# Patient Record
Sex: Female | Born: 1985 | Race: Black or African American | Hispanic: No | Marital: Single | Smoking: Current every day smoker
Health system: Southern US, Community
[De-identification: ages and names within clinical notes are randomized; demographics above are authoritative.]

## PROBLEM LIST (undated history)

## (undated) ENCOUNTER — Inpatient Hospital Stay (HOSPITAL_COMMUNITY): Payer: Self-pay

## (undated) ENCOUNTER — Ambulatory Visit (HOSPITAL_COMMUNITY): Payer: Self-pay

## (undated) DIAGNOSIS — F329 Major depressive disorder, single episode, unspecified: Secondary | ICD-10-CM

## (undated) DIAGNOSIS — F32A Depression, unspecified: Secondary | ICD-10-CM

## (undated) DIAGNOSIS — F909 Attention-deficit hyperactivity disorder, unspecified type: Secondary | ICD-10-CM

## (undated) DIAGNOSIS — F319 Bipolar disorder, unspecified: Secondary | ICD-10-CM

## (undated) DIAGNOSIS — Z8619 Personal history of other infectious and parasitic diseases: Secondary | ICD-10-CM

## (undated) DIAGNOSIS — I10 Essential (primary) hypertension: Secondary | ICD-10-CM

## (undated) HISTORY — PX: CHOLECYSTECTOMY: SHX55

---
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
## 2006-12-17 ENCOUNTER — Other Ambulatory Visit: Admission: RE | Admit: 2006-12-17 | Discharge: 2006-12-17 | Payer: Self-pay | Admitting: Obstetrics and Gynecology

## 2007-04-30 ENCOUNTER — Emergency Department (HOSPITAL_COMMUNITY): Admission: EM | Admit: 2007-04-30 | Discharge: 2007-04-30 | Payer: Self-pay | Admitting: Emergency Medicine

## 2007-05-28 ENCOUNTER — Inpatient Hospital Stay (HOSPITAL_COMMUNITY): Admission: AD | Admit: 2007-05-28 | Discharge: 2007-05-28 | Payer: Self-pay | Admitting: Obstetrics & Gynecology

## 2007-12-04 ENCOUNTER — Emergency Department (HOSPITAL_COMMUNITY): Admission: EM | Admit: 2007-12-04 | Discharge: 2007-12-04 | Payer: Self-pay | Admitting: Emergency Medicine

## 2007-12-08 ENCOUNTER — Emergency Department (HOSPITAL_COMMUNITY): Admission: EM | Admit: 2007-12-08 | Discharge: 2007-12-08 | Payer: Self-pay | Admitting: Emergency Medicine

## 2008-02-02 ENCOUNTER — Emergency Department (HOSPITAL_COMMUNITY): Admission: EM | Admit: 2008-02-02 | Discharge: 2008-02-02 | Payer: Self-pay | Admitting: Emergency Medicine

## 2008-02-07 ENCOUNTER — Inpatient Hospital Stay (HOSPITAL_COMMUNITY): Admission: AD | Admit: 2008-02-07 | Discharge: 2008-02-07 | Payer: Self-pay | Admitting: Family Medicine

## 2008-02-08 ENCOUNTER — Emergency Department (HOSPITAL_COMMUNITY): Admission: EM | Admit: 2008-02-08 | Discharge: 2008-02-08 | Payer: Self-pay | Admitting: Emergency Medicine

## 2008-02-14 ENCOUNTER — Inpatient Hospital Stay (HOSPITAL_COMMUNITY): Admission: RE | Admit: 2008-02-14 | Discharge: 2008-02-14 | Payer: Self-pay | Admitting: Family Medicine

## 2008-02-14 ENCOUNTER — Ambulatory Visit: Payer: Self-pay | Admitting: Physician Assistant

## 2008-10-19 ENCOUNTER — Other Ambulatory Visit: Admission: RE | Admit: 2008-10-19 | Discharge: 2008-10-19 | Payer: Self-pay | Admitting: Obstetrics and Gynecology

## 2008-11-11 ENCOUNTER — Inpatient Hospital Stay (HOSPITAL_COMMUNITY): Admission: AD | Admit: 2008-11-11 | Discharge: 2008-11-11 | Payer: Self-pay | Admitting: Obstetrics & Gynecology

## 2009-01-08 ENCOUNTER — Ambulatory Visit (HOSPITAL_COMMUNITY): Admission: RE | Admit: 2009-01-08 | Discharge: 2009-01-08 | Payer: Self-pay | Admitting: Obstetrics

## 2009-03-12 ENCOUNTER — Ambulatory Visit (HOSPITAL_COMMUNITY): Admission: RE | Admit: 2009-03-12 | Discharge: 2009-03-12 | Payer: Self-pay | Admitting: Obstetrics

## 2009-05-23 ENCOUNTER — Inpatient Hospital Stay (HOSPITAL_COMMUNITY): Admission: AD | Admit: 2009-05-23 | Discharge: 2009-05-23 | Payer: Self-pay | Admitting: Obstetrics

## 2009-06-03 ENCOUNTER — Inpatient Hospital Stay (HOSPITAL_COMMUNITY): Admission: AD | Admit: 2009-06-03 | Discharge: 2009-06-07 | Payer: Self-pay | Admitting: Obstetrics

## 2009-06-27 ENCOUNTER — Emergency Department (HOSPITAL_BASED_OUTPATIENT_CLINIC_OR_DEPARTMENT_OTHER): Admission: EM | Admit: 2009-06-27 | Discharge: 2009-06-28 | Payer: Self-pay | Admitting: Emergency Medicine

## 2009-06-28 ENCOUNTER — Ambulatory Visit: Payer: Self-pay | Admitting: Diagnostic Radiology

## 2009-07-01 ENCOUNTER — Ambulatory Visit: Admission: RE | Admit: 2009-07-01 | Discharge: 2009-07-01 | Payer: Self-pay | Admitting: Obstetrics

## 2009-07-08 ENCOUNTER — Ambulatory Visit: Admission: RE | Admit: 2009-07-08 | Discharge: 2009-07-08 | Payer: Self-pay | Admitting: Obstetrics

## 2009-07-16 ENCOUNTER — Emergency Department (HOSPITAL_BASED_OUTPATIENT_CLINIC_OR_DEPARTMENT_OTHER): Admission: EM | Admit: 2009-07-16 | Discharge: 2009-07-17 | Payer: Self-pay | Admitting: Emergency Medicine

## 2009-07-17 ENCOUNTER — Ambulatory Visit: Payer: Self-pay | Admitting: Diagnostic Radiology

## 2009-07-26 ENCOUNTER — Emergency Department (HOSPITAL_BASED_OUTPATIENT_CLINIC_OR_DEPARTMENT_OTHER): Admission: EM | Admit: 2009-07-26 | Discharge: 2009-07-27 | Payer: Self-pay | Admitting: Emergency Medicine

## 2009-08-23 ENCOUNTER — Encounter: Payer: Self-pay | Admitting: Internal Medicine

## 2009-09-09 ENCOUNTER — Ambulatory Visit (HOSPITAL_COMMUNITY): Admission: RE | Admit: 2009-09-09 | Discharge: 2009-09-09 | Payer: Self-pay | Admitting: Surgery

## 2009-10-22 ENCOUNTER — Emergency Department (HOSPITAL_COMMUNITY): Admission: EM | Admit: 2009-10-22 | Discharge: 2009-10-22 | Payer: Self-pay | Admitting: Emergency Medicine

## 2010-02-16 ENCOUNTER — Emergency Department (HOSPITAL_COMMUNITY)
Admission: EM | Admit: 2010-02-16 | Discharge: 2010-02-16 | Payer: Self-pay | Source: Home / Self Care | Admitting: Emergency Medicine

## 2010-02-21 LAB — CBC
HCT: 37.6 % (ref 36.0–46.0)
Hemoglobin: 11.9 g/dL — ABNORMAL LOW (ref 12.0–15.0)
MCH: 25.1 pg — ABNORMAL LOW (ref 26.0–34.0)
MCHC: 31.6 g/dL (ref 30.0–36.0)
MCV: 79.2 fL (ref 78.0–100.0)
Platelets: 332 10*3/uL (ref 150–400)
RDW: 14.4 % (ref 11.5–15.5)

## 2010-02-21 LAB — WET PREP, GENITAL
Trich, Wet Prep: NONE SEEN
Yeast Wet Prep HPF POC: NONE SEEN

## 2010-02-21 LAB — URINALYSIS, ROUTINE W REFLEX MICROSCOPIC
Bilirubin Urine: NEGATIVE
Ketones, ur: NEGATIVE mg/dL
Nitrite: NEGATIVE
Protein, ur: NEGATIVE mg/dL
Specific Gravity, Urine: 1.026 (ref 1.005–1.030)
Urine Glucose, Fasting: NEGATIVE mg/dL

## 2010-02-21 LAB — DIFFERENTIAL
Basophils Absolute: 0 10*3/uL (ref 0.0–0.1)
Eosinophils Relative: 1 % (ref 0–5)
Lymphocytes Relative: 27 % (ref 12–46)
Monocytes Absolute: 1.1 10*3/uL — ABNORMAL HIGH (ref 0.1–1.0)
Monocytes Relative: 7 % (ref 3–12)

## 2010-03-01 NOTE — Letter (Signed)
Summary: [facility]   Imported By: Maryln Gottron 09/20/2009 13:06:52  _____________________________________________________________________  External Attachment:    Type:   Image     Comment:   External Document

## 2010-03-24 ENCOUNTER — Ambulatory Visit (INDEPENDENT_AMBULATORY_CARE_PROVIDER_SITE_OTHER): Payer: Self-pay

## 2010-03-24 ENCOUNTER — Inpatient Hospital Stay (INDEPENDENT_AMBULATORY_CARE_PROVIDER_SITE_OTHER)
Admission: RE | Admit: 2010-03-24 | Discharge: 2010-03-24 | Disposition: A | Discharge status: Home / Self Care | Payer: Self-pay | Source: Ambulatory Visit | Attending: Family Medicine | Admitting: Family Medicine

## 2010-03-24 DIAGNOSIS — K5900 Constipation, unspecified: Secondary | ICD-10-CM

## 2010-03-24 LAB — POCT URINALYSIS DIPSTICK
Nitrite: NEGATIVE
Protein, ur: 30 mg/dL — AB
Specific Gravity, Urine: 1.025 (ref 1.005–1.030)
Urine Glucose, Fasting: NEGATIVE mg/dL

## 2010-03-24 LAB — POCT PREGNANCY, URINE: Preg Test, Ur: NEGATIVE

## 2010-04-14 LAB — POCT I-STAT, CHEM 8
BUN: 17 mg/dL (ref 6–23)
Calcium, Ion: 1.19 mmol/L (ref 1.12–1.32)
HCT: 39 % (ref 36.0–46.0)
Hemoglobin: 13.3 g/dL (ref 12.0–15.0)
Sodium: 140 mEq/L (ref 135–145)
TCO2: 26 mmol/L (ref 0–100)

## 2010-04-14 LAB — URINALYSIS, ROUTINE W REFLEX MICROSCOPIC
Hgb urine dipstick: NEGATIVE
Ketones, ur: NEGATIVE mg/dL
Nitrite: NEGATIVE
Protein, ur: NEGATIVE mg/dL
Urobilinogen, UA: 0.2 mg/dL (ref 0.0–1.0)

## 2010-04-14 LAB — POCT PREGNANCY, URINE: Preg Test, Ur: NEGATIVE

## 2010-04-15 LAB — CBC
HCT: 34.5 % — ABNORMAL LOW (ref 36.0–46.0)
Hemoglobin: 11.3 g/dL — ABNORMAL LOW (ref 12.0–15.0)
MCHC: 32.8 g/dL (ref 30.0–36.0)
RBC: 4.49 MIL/uL (ref 3.87–5.11)
WBC: 10.1 10*3/uL (ref 4.0–10.5)

## 2010-04-15 LAB — COMPREHENSIVE METABOLIC PANEL
ALT: 16 U/L (ref 0–35)
AST: 17 U/L (ref 0–37)
Alkaline Phosphatase: 93 U/L (ref 39–117)
CO2: 25 mEq/L (ref 19–32)
Calcium: 9.4 mg/dL (ref 8.4–10.5)
Chloride: 108 mEq/L (ref 96–112)
GFR calc Af Amer: >60 mL/min (ref >60)
GFR calc non Af Amer: >60 mL/min (ref >60)
Glucose, Bld: 88 mg/dL (ref 70–99)
Potassium: 3.6 mEq/L (ref 3.5–5.1)
Sodium: 139 mEq/L (ref 135–145)

## 2010-04-15 LAB — SURGICAL PCR SCREEN: Staphylococcus aureus: NEGATIVE

## 2010-04-17 LAB — COMPREHENSIVE METABOLIC PANEL
ALT: 39 U/L — ABNORMAL HIGH (ref 0–35)
AST: 107 U/L — ABNORMAL HIGH (ref 0–37)
Albumin: 3.9 g/dL (ref 3.5–5.2)
Alkaline Phosphatase: 100 U/L (ref 39–117)
BUN: 15 mg/dL (ref 6–23)
Calcium: 9.5 mg/dL (ref 8.4–10.5)
GFR calc Af Amer: >60 mL/min (ref >60)
Glucose, Bld: 88 mg/dL (ref 70–99)
Potassium: 3.9 mEq/L (ref 3.5–5.1)
Potassium: 4 mEq/L (ref 3.5–5.1)
Sodium: 147 mEq/L — ABNORMAL HIGH (ref 135–145)
Total Protein: 7.2 g/dL (ref 6.0–8.3)
Total Protein: 7.6 g/dL (ref 6.0–8.3)

## 2010-04-17 LAB — DIFFERENTIAL
Basophils Absolute: 0.1 10*3/uL (ref 0.0–0.1)
Basophils Relative: 0 % (ref 0–1)
Eosinophils Absolute: 0.1 10*3/uL (ref 0.0–0.7)
Eosinophils Relative: 1 % (ref 0–5)
Lymphs Abs: 3 10*3/uL (ref 0.7–4.0)
Monocytes Absolute: 1 10*3/uL (ref 0.1–1.0)
Monocytes Relative: 6 % (ref 3–12)
Monocytes Relative: 7 % (ref 3–12)
Neutro Abs: 7.1 10*3/uL (ref 1.7–7.7)
Neutrophils Relative %: 55 % (ref 43–77)

## 2010-04-17 LAB — CBC
HCT: 33.4 % — ABNORMAL LOW (ref 36.0–46.0)
MCH: 25.9 pg — ABNORMAL LOW (ref 26.0–34.0)
MCHC: 32.3 g/dL (ref 30.0–36.0)
MCHC: 33 g/dL (ref 30.0–36.0)
MCV: 78.5 fL (ref 78.0–100.0)
RBC: 4.25 MIL/uL (ref 3.87–5.11)
RDW: 13.9 % (ref 11.5–15.5)
RDW: 14.7 % (ref 11.5–15.5)
WBC: 13 10*3/uL — ABNORMAL HIGH (ref 4.0–10.5)

## 2010-04-17 LAB — URINE MICROSCOPIC-ADD ON

## 2010-04-17 LAB — URINALYSIS, ROUTINE W REFLEX MICROSCOPIC
Glucose, UA: NEGATIVE mg/dL
Hgb urine dipstick: NEGATIVE
Ketones, ur: NEGATIVE mg/dL
pH: 7 (ref 5.0–8.0)

## 2010-04-18 LAB — CBC
MCV: 81.8 fL (ref 78.0–100.0)
RBC: 4.22 MIL/uL (ref 3.87–5.11)
WBC: 11.3 10*3/uL — ABNORMAL HIGH (ref 4.0–10.5)

## 2010-04-18 LAB — LIPASE, BLOOD: Lipase: 193 U/L (ref 23–300)

## 2010-04-18 LAB — COMPREHENSIVE METABOLIC PANEL
ALT: 155 U/L — ABNORMAL HIGH (ref 0–35)
AST: 296 U/L — ABNORMAL HIGH (ref 0–37)
Alkaline Phosphatase: 266 U/L — ABNORMAL HIGH (ref 39–117)
CO2: 26 mEq/L (ref 19–32)
Chloride: 104 mEq/L (ref 96–112)
GFR calc Af Amer: >60 mL/min (ref >60)
GFR calc non Af Amer: >60 mL/min (ref >60)
Potassium: 4.3 mEq/L (ref 3.5–5.1)
Sodium: 144 mEq/L (ref 135–145)
Total Bilirubin: 0.6 mg/dL (ref 0.3–1.2)

## 2010-04-18 LAB — DIFFERENTIAL
Basophils Absolute: 0 10*3/uL (ref 0.0–0.1)
Eosinophils Absolute: 0.1 10*3/uL (ref 0.0–0.7)
Eosinophils Relative: 1 % (ref 0–5)

## 2010-04-19 LAB — CBC
HCT: 31.2 % — ABNORMAL LOW (ref 36.0–46.0)
MCV: 84.3 fL (ref 78.0–100.0)
Platelets: 276 10*3/uL (ref 150–400)
Platelets: 338 10*3/uL (ref 150–400)
RDW: 13.4 % (ref 11.5–15.5)
RDW: 13.6 % (ref 11.5–15.5)
WBC: 31.4 10*3/uL — ABNORMAL HIGH (ref 4.0–10.5)

## 2010-04-19 LAB — CCBB MATERNAL DONOR DRAW

## 2010-04-19 LAB — COMPREHENSIVE METABOLIC PANEL
AST: 26 U/L (ref 0–37)
Albumin: 1.9 g/dL — ABNORMAL LOW (ref 3.5–5.2)
Chloride: 106 mEq/L (ref 96–112)
Creatinine, Ser: 1.15 mg/dL (ref 0.4–1.2)
GFR calc Af Amer: >60 mL/min (ref >60)
Total Bilirubin: 1.3 mg/dL — ABNORMAL HIGH (ref 0.3–1.2)
Total Protein: 5 g/dL — ABNORMAL LOW (ref 6.0–8.3)

## 2010-05-05 LAB — URINALYSIS, ROUTINE W REFLEX MICROSCOPIC
Bilirubin Urine: NEGATIVE
Glucose, UA: NEGATIVE mg/dL
Hgb urine dipstick: NEGATIVE
Ketones, ur: NEGATIVE mg/dL
Protein, ur: NEGATIVE mg/dL

## 2010-05-05 LAB — GC/CHLAMYDIA PROBE AMP, GENITAL
Chlamydia, DNA Probe: NEGATIVE
GC Probe Amp, Genital: NEGATIVE

## 2010-05-05 LAB — URINE MICROSCOPIC-ADD ON

## 2010-05-05 LAB — WET PREP, GENITAL
Trich, Wet Prep: NONE SEEN
Yeast Wet Prep HPF POC: NONE SEEN

## 2010-05-16 LAB — CBC
HCT: 39.5 % (ref 36.0–46.0)
Hemoglobin: 12.9 g/dL (ref 12.0–15.0)
MCV: 85.9 fL (ref 78.0–100.0)
Platelets: 299 10*3/uL (ref 150–400)
RDW: 12.9 % (ref 11.5–15.5)

## 2010-05-16 LAB — HCG, QUANTITATIVE, PREGNANCY: hCG, Beta Chain, Quant, S: 5545 m[IU]/mL — ABNORMAL HIGH (ref <5)

## 2010-05-16 LAB — HEMOGLOBIN AND HEMATOCRIT, BLOOD: Hemoglobin: 13.6 g/dL (ref 12.0–15.0)

## 2010-05-16 LAB — WET PREP, GENITAL: Trich, Wet Prep: NONE SEEN

## 2010-05-31 ENCOUNTER — Emergency Department (HOSPITAL_COMMUNITY)
Admission: EM | Admit: 2010-05-31 | Discharge: 2010-05-31 | Disposition: A | Discharge status: Home / Self Care | Payer: Self-pay | Attending: Emergency Medicine | Admitting: Emergency Medicine

## 2010-05-31 DIAGNOSIS — N898 Other specified noninflammatory disorders of vagina: Secondary | ICD-10-CM | POA: Insufficient documentation

## 2010-05-31 DIAGNOSIS — N949 Unspecified condition associated with female genital organs and menstrual cycle: Secondary | ICD-10-CM | POA: Insufficient documentation

## 2010-05-31 DIAGNOSIS — R109 Unspecified abdominal pain: Secondary | ICD-10-CM | POA: Insufficient documentation

## 2010-05-31 LAB — URINE MICROSCOPIC-ADD ON

## 2010-05-31 LAB — POCT I-STAT, CHEM 8
Calcium, Ion: 1.18 mmol/L (ref 1.12–1.32)
Chloride: 104 mEq/L (ref 96–112)
Creatinine, Ser: 1.1 mg/dL (ref 0.4–1.2)
Glucose, Bld: 90 mg/dL (ref 70–99)
HCT: 43 % (ref 36.0–46.0)
Potassium: 4 mEq/L (ref 3.5–5.1)

## 2010-05-31 LAB — HEPATIC FUNCTION PANEL
AST: 13 U/L (ref 0–37)
Bilirubin, Direct: <0.1 mg/dL (ref 0.0–0.3)
Total Bilirubin: 0.2 mg/dL — ABNORMAL LOW (ref 0.3–1.2)

## 2010-05-31 LAB — DIFFERENTIAL
Eosinophils Absolute: 0.1 10*3/uL (ref 0.0–0.7)
Eosinophils Relative: 1 % (ref 0–5)
Lymphocytes Relative: 33 % (ref 12–46)
Lymphs Abs: 4.2 10*3/uL — ABNORMAL HIGH (ref 0.7–4.0)
Monocytes Absolute: 0.8 10*3/uL (ref 0.1–1.0)
Monocytes Relative: 6 % (ref 3–12)

## 2010-05-31 LAB — CBC
HCT: 37.1 % (ref 36.0–46.0)
MCH: 26.4 pg (ref 26.0–34.0)
MCHC: 32.1 g/dL (ref 30.0–36.0)
MCV: 82.3 fL (ref 78.0–100.0)
Platelets: 303 10*3/uL (ref 150–400)
RDW: 15.3 % (ref 11.5–15.5)
WBC: 13 10*3/uL — ABNORMAL HIGH (ref 4.0–10.5)

## 2010-05-31 LAB — URINALYSIS, ROUTINE W REFLEX MICROSCOPIC
Glucose, UA: NEGATIVE mg/dL
Ketones, ur: NEGATIVE mg/dL
Leukocytes, UA: NEGATIVE
Nitrite: NEGATIVE
Protein, ur: 30 mg/dL — AB
pH: 6 (ref 5.0–8.0)

## 2010-05-31 LAB — WET PREP, GENITAL

## 2010-05-31 LAB — PREGNANCY, URINE: Preg Test, Ur: NEGATIVE

## 2010-06-01 LAB — GC/CHLAMYDIA PROBE AMP, GENITAL: Chlamydia, DNA Probe: NEGATIVE

## 2010-10-24 LAB — RAPID STREP SCREEN (MED CTR MEBANE ONLY): Streptococcus, Group A Screen (Direct): NEGATIVE

## 2010-10-25 LAB — WET PREP, GENITAL: Yeast Wet Prep HPF POC: NONE SEEN

## 2010-10-25 LAB — URINALYSIS, ROUTINE W REFLEX MICROSCOPIC
Bilirubin Urine: NEGATIVE
Glucose, UA: NEGATIVE
Hgb urine dipstick: NEGATIVE
Specific Gravity, Urine: >1.03 — ABNORMAL HIGH
pH: 6

## 2010-10-25 LAB — POCT PREGNANCY, URINE: Operator id: 25114

## 2011-08-11 ENCOUNTER — Encounter (HOSPITAL_COMMUNITY): Payer: Self-pay | Admitting: *Deleted

## 2011-08-11 ENCOUNTER — Inpatient Hospital Stay (HOSPITAL_COMMUNITY)
Admission: AD | Admit: 2011-08-11 | Discharge: 2011-08-11 | Disposition: A | Discharge status: Home / Self Care | Payer: Medicaid Other | Source: Ambulatory Visit | Attending: Obstetrics | Admitting: Obstetrics

## 2011-08-11 DIAGNOSIS — B9689 Other specified bacterial agents as the cause of diseases classified elsewhere: Secondary | ICD-10-CM | POA: Insufficient documentation

## 2011-08-11 DIAGNOSIS — N938 Other specified abnormal uterine and vaginal bleeding: Secondary | ICD-10-CM | POA: Insufficient documentation

## 2011-08-11 DIAGNOSIS — N939 Abnormal uterine and vaginal bleeding, unspecified: Secondary | ICD-10-CM

## 2011-08-11 DIAGNOSIS — N760 Acute vaginitis: Secondary | ICD-10-CM | POA: Insufficient documentation

## 2011-08-11 DIAGNOSIS — N949 Unspecified condition associated with female genital organs and menstrual cycle: Secondary | ICD-10-CM | POA: Insufficient documentation

## 2011-08-11 DIAGNOSIS — N921 Excessive and frequent menstruation with irregular cycle: Secondary | ICD-10-CM

## 2011-08-11 DIAGNOSIS — N898 Other specified noninflammatory disorders of vagina: Secondary | ICD-10-CM

## 2011-08-11 DIAGNOSIS — A499 Bacterial infection, unspecified: Secondary | ICD-10-CM | POA: Insufficient documentation

## 2011-08-11 LAB — URINE MICROSCOPIC-ADD ON

## 2011-08-11 LAB — URINALYSIS, ROUTINE W REFLEX MICROSCOPIC
Glucose, UA: NEGATIVE mg/dL
Leukocytes, UA: NEGATIVE
Nitrite: NEGATIVE
Protein, ur: NEGATIVE mg/dL
Urobilinogen, UA: 0.2 mg/dL (ref 0.0–1.0)

## 2011-08-11 LAB — CBC WITH DIFFERENTIAL/PLATELET
Basophils Absolute: 0 10*3/uL (ref 0.0–0.1)
Eosinophils Relative: 1 % (ref 0–5)
HCT: 41.9 % (ref 36.0–46.0)
Lymphocytes Relative: 40 % (ref 12–46)
MCHC: 31.5 g/dL (ref 30.0–36.0)
MCV: 84.1 fL (ref 78.0–100.0)
Monocytes Absolute: 0.8 10*3/uL (ref 0.1–1.0)
RDW: 14.4 % (ref 11.5–15.5)
WBC: 12.9 10*3/uL — ABNORMAL HIGH (ref 4.0–10.5)

## 2011-08-11 LAB — WET PREP, GENITAL: Trich, Wet Prep: NONE SEEN

## 2011-08-11 MED ORDER — METRONIDAZOLE 500 MG PO TABS: 500.0000 mg | ORAL_TABLET | Freq: Two times a day (BID) | ORAL | Status: AC

## 2011-08-11 NOTE — MAU Provider Note (Signed)
History     CSN: [id number]  Arrival date and time: 08/11/11 1425   First Provider Initiated Contact with Patient 08/11/11 1519      Chief Complaint  Patient presents with  . Vaginal Bleeding   HPI Ms Fujii is a 26 yo female who presents today with reports of small amounts of vaginal bleeding over the past 1 - 2 months. She notices the bleeding when wiping after urinating, does not require pad or tampon. Concomitant lower abdominal cramping. No sexual activity since bleeding began. Pt questioned whether could be due to the Implanon implant in her left arm, place 08/17/09. She had difficulty remembering LMP, but dated it to be at the beginning of June. She bled for 5 days, normal flow, then 1 day of no bleeding before returning to the ongoing scant vaginal bleeding.   Pertinent Gynecological History: Menses: flow is moderate, usually lasting less than 6 days and with minimal cramping Bleeding: intermenstrual bleeding Contraception: Implanon Sexually transmitted diseases: no past history and currently at risk Last pap: pt does not know date.  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 History is unclear.  History reviewed. No pertinent past medical history.  Past Surgical History  Procedure Date  . Cholecystectomy after baby was born    History reviewed. No pertinent family history.  History  Substance Use Topics  . Smoking status: Former Games developer  . Smokeless tobacco: Not on file  . Alcohol Use: No    Allergies: No Known Allergies  No prescriptions prior to admission    Review of Systems  Constitutional: Positive for fever (per pt report, did not take temperature at the time). Negative for chills.  Gastrointestinal: Positive for nausea, vomiting (1x last week) and abdominal pain (cramping).  Genitourinary: Negative for dysuria, urgency, frequency, hematuria and flank pain.   Physical Exam   Blood pressure 129/76, pulse 69, temperature 97.8 F (36.6 C), temperature source Oral,  resp. rate 18.  Physical Exam  Constitutional: She appears well-developed and well-nourished. No distress.  HENT:  Head: Normocephalic and atraumatic.  Cardiovascular: Normal rate, regular rhythm and normal heart sounds.   Respiratory: Effort normal and breath sounds normal.  GI: Soft. Bowel sounds are normal. She exhibits no mass. There is tenderness in the suprapubic area. There is no rebound and no guarding.  Genitourinary: Pelvic exam was performed with patient supine. Uterus is not tender (mild). Cervix exhibits no friability. Right adnexum displays no mass and no tenderness. Left adnexum displays no mass and no tenderness. There is bleeding (small amount of dark red blood in vaginal vault, no active bleeding; malodorous) around the vagina. No erythema or tenderness around the vagina. Vaginal discharge: malodorous.    MAU Course  Procedures  MDM Initial differential: Abnormal vaginal bleeding.  Urine pregnancy, urinalysis ordered CBC w/diff, GC/Chlamydia, wet prep ordered Results for orders placed during the hospital encounter of 08/11/11 (from the past 24 hour(s))  URINALYSIS, ROUTINE W REFLEX MICROSCOPIC     Status: Abnormal   Collection Time   08/11/11  2:47 PM      Component Value Range   Color, Urine YELLOW  YELLOW   APPearance CLEAR  CLEAR   Specific Gravity, Urine >1.030 (*) 1.005 - 1.030   pH 6.0  5.0 - 8.0   Glucose, UA NEGATIVE  NEGATIVE mg/dL   Hgb urine dipstick TRACE (*) NEGATIVE   Bilirubin Urine NEGATIVE  NEGATIVE   Ketones, ur NEGATIVE  NEGATIVE mg/dL   Protein, ur NEGATIVE  NEGATIVE mg/dL  Urobilinogen, UA 0.2  0.0 - 1.0 mg/dL   Nitrite NEGATIVE  NEGATIVE   Leukocytes, UA NEGATIVE  NEGATIVE  URINE MICROSCOPIC-ADD ON     Status: Abnormal   Collection Time   08/11/11  2:47 PM      Component Value Range   Squamous Epithelial / LPF FEW (*) RARE   RBC / HPF 0-2  <3 RBC/hpf   Bacteria, UA FEW (*) RARE   Urine-Other MUCOUS PRESENT    POCT PREGNANCY, URINE      Status: Normal   Collection Time   08/11/11  3:07 PM      Component Value Range   Preg Test, Ur NEGATIVE  NEGATIVE  CBC WITH DIFFERENTIAL     Status: Abnormal   Collection Time   08/11/11  3:39 PM      Component Value Range   WBC 12.9 (*) 4.0 - 10.5 K/uL   RBC 4.98  3.87 - 5.11 MIL/uL   Hemoglobin 13.2  12.0 - 15.0 g/dL   HCT 09.8  11.9 - 14.7 %   MCV 84.1  78.0 - 100.0 fL   MCH 26.5  26.0 - 34.0 pg   MCHC 31.5  30.0 - 36.0 g/dL   RDW 82.9  56.2 - 13.0 %   Platelets 290  150 - 400 K/uL   Neutrophils Relative 52  43 - 77 %   Neutro Abs 6.7  1.7 - 7.7 K/uL   Lymphocytes Relative 40  12 - 46 %   Lymphs Abs 5.2 (*) 0.7 - 4.0 K/uL   Monocytes Relative 6  3 - 12 %   Monocytes Absolute 0.8  0.1 - 1.0 K/uL   Eosinophils Relative 1  0 - 5 %   Eosinophils Absolute 0.2  0.0 - 0.7 K/uL   Basophils Relative 0  0 - 1 %   Basophils Absolute 0.0  0.0 - 0.1 K/uL  WET PREP, GENITAL     Status: Abnormal   Collection Time   08/11/11  3:50 PM      Component Value Range   Yeast Wet Prep HPF POC NONE SEEN  NONE SEEN   Trich, Wet Prep NONE SEEN  NONE SEEN   Clue Cells Wet Prep HPF POC FEW (*) NONE SEEN   WBC, Wet Prep HPF POC FEW (*) NONE SEEN    Assessment and Plan  1. Negative urine pregnancy 2. Abnormal vaginal bleeding with Implanon 3.  Bacterial vaginosis  Plan:1.. Prescribe flagyl 500 mg bid x7days, counseled pt on BV and the medication 2. FOllow up with Dr. Gaynell Face for continue bleeding after treatment for BV 3>  Patient instructed to call Dr. Elsie Stain office for GC/CHL results on Tuesday  [place] 08/11/2011, 3:27 PM

## 2011-08-11 NOTE — MAU Note (Signed)
Pt presents with complaints of abdominal pain for approximately 2 months with bleeding in between menstrual cycle

## 2011-12-24 ENCOUNTER — Inpatient Hospital Stay (HOSPITAL_COMMUNITY)
Admission: AD | Admit: 2011-12-24 | Discharge: 2011-12-24 | Disposition: A | Discharge status: Home / Self Care | Payer: Self-pay | Source: Ambulatory Visit | Attending: Obstetrics & Gynecology | Admitting: Obstetrics & Gynecology

## 2011-12-24 ENCOUNTER — Encounter (HOSPITAL_COMMUNITY): Payer: Self-pay | Admitting: Obstetrics and Gynecology

## 2011-12-24 DIAGNOSIS — N760 Acute vaginitis: Secondary | ICD-10-CM | POA: Insufficient documentation

## 2011-12-24 DIAGNOSIS — B9689 Other specified bacterial agents as the cause of diseases classified elsewhere: Secondary | ICD-10-CM | POA: Insufficient documentation

## 2011-12-24 DIAGNOSIS — A499 Bacterial infection, unspecified: Secondary | ICD-10-CM | POA: Insufficient documentation

## 2011-12-24 DIAGNOSIS — R109 Unspecified abdominal pain: Secondary | ICD-10-CM | POA: Insufficient documentation

## 2011-12-24 LAB — URINE MICROSCOPIC-ADD ON

## 2011-12-24 LAB — WET PREP, GENITAL: Yeast Wet Prep HPF POC: NONE SEEN

## 2011-12-24 LAB — URINALYSIS, ROUTINE W REFLEX MICROSCOPIC
Bilirubin Urine: NEGATIVE
Glucose, UA: NEGATIVE mg/dL
Ketones, ur: NEGATIVE mg/dL
pH: 6 (ref 5.0–8.0)

## 2011-12-24 MED ORDER — METRONIDAZOLE 500 MG PO TABS: 500.0000 mg | ORAL_TABLET | Freq: Two times a day (BID) | ORAL | Status: DC

## 2011-12-24 NOTE — MAU Note (Signed)
Pt presents to MAU with chief complaint of lower abdominal pain. Pt is not pregnant- came today for a pregnancy test. States she started her Menses today; however this is the 2nd cycle this month. Pt is sexually actve; unprotected, recently had her Nexplanon removed due to irregular bleeding.

## 2011-12-24 NOTE — MAU Note (Signed)
Pt report having pain in her abd and pelvic area for 1 week on and off
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
## 2012-01-31 ENCOUNTER — Encounter (HOSPITAL_COMMUNITY): Payer: Self-pay | Admitting: Emergency Medicine

## 2012-01-31 ENCOUNTER — Emergency Department (HOSPITAL_COMMUNITY)
Admission: EM | Admit: 2012-01-31 | Discharge: 2012-01-31 | Disposition: A | Discharge status: Home / Self Care | Payer: Self-pay | Attending: Emergency Medicine | Admitting: Emergency Medicine

## 2012-01-31 ENCOUNTER — Emergency Department (HOSPITAL_COMMUNITY): Payer: Self-pay

## 2012-01-31 DIAGNOSIS — T148XXA Other injury of unspecified body region, initial encounter: Secondary | ICD-10-CM

## 2012-01-31 DIAGNOSIS — IMO0002 Reserved for concepts with insufficient information to code with codable children: Secondary | ICD-10-CM | POA: Insufficient documentation

## 2012-01-31 DIAGNOSIS — T7491XA Unspecified adult maltreatment, confirmed, initial encounter: Secondary | ICD-10-CM | POA: Insufficient documentation

## 2012-01-31 DIAGNOSIS — T7492XA Unspecified child maltreatment, confirmed, initial encounter: Secondary | ICD-10-CM | POA: Insufficient documentation

## 2012-01-31 DIAGNOSIS — M25511 Pain in right shoulder: Secondary | ICD-10-CM

## 2012-01-31 MED ORDER — HYDROMORPHONE HCL PF 1 MG/ML IJ SOLN
1.0000 mg | Freq: Once | INTRAMUSCULAR | Status: AC
  Administered 2012-01-31: 1 mg via INTRAVENOUS
  Filled 2012-01-31: qty 1

## 2012-01-31 MED ORDER — ONDANSETRON HCL 4 MG/2ML IJ SOLN
4.0000 mg | Freq: Once | INTRAMUSCULAR | Status: AC
  Administered 2012-01-31: 4 mg via INTRAVENOUS
  Filled 2012-01-31: qty 2

## 2012-01-31 MED ORDER — OXYCODONE-ACETAMINOPHEN 5-325 MG PO TABS: ORAL_TABLET | ORAL | Status: DC
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
## 2012-01-31 NOTE — ED Notes (Signed)
Patient transported to X-ray 
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
## 2012-01-31 NOTE — Progress Notes (Signed)
Orthopedic Tech Progress Note Patient Details:  Suzanne Bush November 24, 1985 [id number]  Ortho Devices Type of Ortho Device: Arm sling Ortho Device/Splint Location: RIGHT ARM SLING Ortho Device/Splint Interventions: Application   Cammer, Mickie Bail 01/31/2012, 5:54 AM
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
## 2012-08-13 ENCOUNTER — Emergency Department (HOSPITAL_BASED_OUTPATIENT_CLINIC_OR_DEPARTMENT_OTHER)
Admission: EM | Admit: 2012-08-13 | Discharge: 2012-08-14 | Disposition: A | Discharge status: Home / Self Care | Payer: Medicaid Other | Attending: Emergency Medicine | Admitting: Emergency Medicine

## 2012-08-13 ENCOUNTER — Encounter (HOSPITAL_BASED_OUTPATIENT_CLINIC_OR_DEPARTMENT_OTHER): Payer: Self-pay | Admitting: *Deleted

## 2012-08-13 DIAGNOSIS — A599 Trichomoniasis, unspecified: Secondary | ICD-10-CM

## 2012-08-13 DIAGNOSIS — N898 Other specified noninflammatory disorders of vagina: Secondary | ICD-10-CM | POA: Insufficient documentation

## 2012-08-13 DIAGNOSIS — L502 Urticaria due to cold and heat: Secondary | ICD-10-CM | POA: Insufficient documentation

## 2012-08-13 DIAGNOSIS — Z3202 Encounter for pregnancy test, result negative: Secondary | ICD-10-CM | POA: Insufficient documentation

## 2012-08-13 DIAGNOSIS — Z79899 Other long term (current) drug therapy: Secondary | ICD-10-CM | POA: Insufficient documentation

## 2012-08-13 DIAGNOSIS — A5901 Trichomonal vulvovaginitis: Secondary | ICD-10-CM | POA: Insufficient documentation

## 2012-08-13 DIAGNOSIS — F17200 Nicotine dependence, unspecified, uncomplicated: Secondary | ICD-10-CM | POA: Insufficient documentation

## 2012-08-13 DIAGNOSIS — Z8619 Personal history of other infectious and parasitic diseases: Secondary | ICD-10-CM | POA: Insufficient documentation

## 2012-08-13 DIAGNOSIS — L293 Anogenital pruritus, unspecified: Secondary | ICD-10-CM | POA: Insufficient documentation

## 2012-08-13 NOTE — ED Notes (Signed)
Pt c/o hives x 2 days

## 2012-08-14 LAB — URINE MICROSCOPIC-ADD ON

## 2012-08-14 LAB — URINALYSIS, ROUTINE W REFLEX MICROSCOPIC
Ketones, ur: NEGATIVE mg/dL
Nitrite: NEGATIVE
Specific Gravity, Urine: 1.034 — ABNORMAL HIGH (ref 1.005–1.030)
pH: 6 (ref 5.0–8.0)

## 2012-08-14 LAB — WET PREP, GENITAL

## 2012-08-14 LAB — PREGNANCY, URINE: Preg Test, Ur: NEGATIVE

## 2012-08-14 LAB — GC/CHLAMYDIA PROBE AMP
CT Probe RNA: NEGATIVE
GC Probe RNA: NEGATIVE

## 2012-08-14 MED ORDER — PREDNISONE 50 MG PO TABS
60.0000 mg | ORAL_TABLET | Freq: Once | ORAL | Status: AC
  Administered 2012-08-14: 60 mg via ORAL

## 2012-08-14 MED ORDER — PREDNISONE 50 MG PO TABS
ORAL_TABLET | ORAL | Status: AC
  Filled 2012-08-14: qty 1

## 2012-08-14 MED ORDER — DIPHENHYDRAMINE HCL 25 MG PO CAPS: 25.0000 mg | ORAL_CAPSULE | Freq: Four times a day (QID) | ORAL | Status: DC | PRN

## 2012-08-14 MED ORDER — LIDOCAINE HCL (PF) 1 % IJ SOLN
INTRAMUSCULAR | Status: AC
  Filled 2012-08-14: qty 5

## 2012-08-14 MED ORDER — METRONIDAZOLE 500 MG PO TABS
2000.0000 mg | ORAL_TABLET | Freq: Once | ORAL | Status: AC
  Administered 2012-08-14: 2000 mg via ORAL
  Filled 2012-08-14: qty 4

## 2012-08-14 MED ORDER — PREDNISONE 10 MG PO TABS
ORAL_TABLET | ORAL | Status: AC
  Filled 2012-08-14: qty 1

## 2012-08-14 MED ORDER — AZITHROMYCIN 250 MG PO TABS
1000.0000 mg | ORAL_TABLET | Freq: Once | ORAL | Status: AC
  Administered 2012-08-14: 1000 mg via ORAL
  Filled 2012-08-14: qty 4

## 2012-08-14 MED ORDER — FLUCONAZOLE 50 MG PO TABS
150.0000 mg | ORAL_TABLET | Freq: Once | ORAL | Status: AC
  Administered 2012-08-14: 150 mg via ORAL
  Filled 2012-08-14: qty 1

## 2012-08-14 MED ORDER — DIPHENHYDRAMINE HCL 25 MG PO CAPS
25.0000 mg | ORAL_CAPSULE | Freq: Four times a day (QID) | ORAL | Status: DC | PRN
  Administered 2012-08-14: 25 mg via ORAL
  Filled 2012-08-14: qty 1

## 2012-08-14 MED ORDER — PREDNISOLONE 5 MG PO TABS
60.0000 mg | ORAL_TABLET | Freq: Once | ORAL | Status: DC
  Filled 2012-08-14: qty 12

## 2012-08-14 MED ORDER — CEFTRIAXONE SODIUM 250 MG IJ SOLR
250.0000 mg | Freq: Once | INTRAMUSCULAR | Status: AC
  Administered 2012-08-14: 250 mg via INTRAMUSCULAR
  Filled 2012-08-14: qty 250

## 2012-08-14 MED ORDER — PREDNISONE 50 MG PO TABS: 50.0000 mg | ORAL_TABLET | Freq: Every day | ORAL | Status: DC

## 2012-08-14 NOTE — ED Provider Notes (Signed)
History    CSN: [id number] Arrival date & time 08/13/12  2314  First MD Initiated Contact with Patient 08/14/12 0010     Chief Complaint  Patient presents with  . Rash   (Consider location/radiation/quality/duration/timing/severity/associated sxs/prior Treatment) HPI Comments: Pt comes in with cc of rash and vaginal itching and discomfort. Rash started 2 days ago, with the arms, then spread diffusely. The rash is itchy. Hx of eczema, no wheezing. Pt has vaginal itching, and a clear discharge with some mild discomfort. Hx of STD, but seeing only 1 partner right now.  Patient is a 27 y.o. female presenting with rash. The history is provided by the patient.  Rash Associated symptoms: vaginal discharge   Associated symptoms: no chest pain, no dysuria, no nausea, no shortness of breath and no vomiting    History reviewed. No pertinent past medical history. Past Surgical History  Procedure Laterality Date  . Cholecystectomy  after baby was born   History reviewed. No pertinent family history. History  Substance Use Topics  . Smoking status: Current Every Day Smoker -- 1.00 packs/day    Types: Cigarettes  . Smokeless tobacco: Not on file  . Alcohol Use: Yes   OB History   Grav Para Term Preterm Abortions TAB SAB Ect Mult Living   3 1 1  0 2 0 2 0 0 1     Review of Systems  Constitutional: Negative for activity change.  HENT: Negative for neck pain.   Respiratory: Negative for shortness of breath.   Cardiovascular: Negative for chest pain.  Gastrointestinal: Negative for nausea, vomiting and abdominal pain.  Genitourinary: Positive for vaginal discharge. Negative for dysuria and flank pain.  Skin: Positive for rash.  Neurological: Negative for headaches.    Allergies  Review of patient's allergies indicates no known allergies.  Home Medications   Current Outpatient Rx  Name  Route  Sig  Dispense  Refill  . diphenhydrAMINE (BENADRYL) 25 mg capsule   Oral   Take 1  capsule (25 mg total) by mouth every 6 (six) hours as needed for itching.   30 capsule   0   . oxyCODONE-acetaminophen (PERCOCET/ROXICET) 5-325 MG per tablet      1 to 2 tabs PO q6hrs  PRN for pain   15 tablet   0   . predniSONE (DELTASONE) 50 MG tablet   Oral   Take 1 tablet (50 mg total) by mouth daily.   5 tablet   0    BP 136/86  Pulse 72  Temp(Src) 97.7 F (36.5 C) (Oral)  Resp 16  Ht 5\' 5"  (1.651 m)  Wt 219 lb (99.338 kg)  BMI 36.44 kg/m2  SpO2 100%  LMP 07/12/2012 Physical Exam  Constitutional: She is oriented to person, place, and time. She appears well-developed and well-nourished.  HENT:  Head: Normocephalic and atraumatic.  Eyes: Conjunctivae and EOM are normal. Pupils are equal, round, and reactive to light.  Neck: Normal range of motion. Neck supple.  Cardiovascular: Normal rate, regular rhythm, normal heart sounds and intact distal pulses.   No murmur heard. Pulmonary/Chest: Effort normal. No respiratory distress. She has no wheezes.  Abdominal: Soft. Bowel sounds are normal. She exhibits no distension. There is no tenderness. There is no rebound and no guarding.  Genitourinary: Vagina normal and uterus normal.  External exam - normal, no lesions Speculum exam: Pt has some white discharge, no blood Bimanual exam: Patient has no CMT, no adnexal tenderness or fullness and cervical os is  closed  Neurological: She is alert and oriented to person, place, and time.  Skin: Skin is warm and dry. Rash noted.  Pt has diffuse wheal type lesions.    ED Course  Procedures (including critical care time) Labs Reviewed  WET PREP, GENITAL - Abnormal; Notable for the following:    Trich, Wet Prep MODERATE (*)    WBC, Wet Prep HPF POC MODERATE (*)    All other components within normal limits  URINALYSIS, ROUTINE W REFLEX MICROSCOPIC - Abnormal; Notable for the following:    Specific Gravity, Urine 1.034 (*)    Bilirubin Urine SMALL (*)    Leukocytes, UA SMALL (*)     All other components within normal limits  URINE MICROSCOPIC-ADD ON - Abnormal; Notable for the following:    Squamous Epithelial / LPF FEW (*)    Bacteria, UA FEW (*)    All other components within normal limits  GC/CHLAMYDIA PROBE AMP  URINE CULTURE  PREGNANCY, URINE   No results found. 1. Trichomonas   2. Urticaria due to cold     MDM  Pt had comes in with cc of rash and vagina discharge. Exam shows an urticaria. Unknown source. No resp component.  Pt's pelvic exam was benign, just mild purulent discharge. + for trichomonas. GC and Chlamydia meds provided as well.     Derwood Kaplan, MD 08/14/12 [phone]

## 2012-08-14 NOTE — ED Notes (Signed)
MD at bedside. 

## 2012-08-15 LAB — URINE CULTURE

## 2012-08-20 ENCOUNTER — Telehealth (HOSPITAL_COMMUNITY): Payer: Self-pay | Admitting: Emergency Medicine

## 2012-08-20 NOTE — ED Notes (Signed)
Pt calling for STD results.  ID verified x 2.  Pt informed GC and Chlamydia both negative.

## 2012-11-11 ENCOUNTER — Encounter (HOSPITAL_COMMUNITY): Payer: Self-pay | Admitting: Emergency Medicine

## 2012-11-11 ENCOUNTER — Emergency Department (HOSPITAL_COMMUNITY)
Admission: EM | Admit: 2012-11-11 | Discharge: 2012-11-13 | Disposition: A | Discharge status: Home / Self Care | Payer: Medicaid Other | Attending: Emergency Medicine | Admitting: Emergency Medicine

## 2012-11-11 DIAGNOSIS — R45851 Suicidal ideations: Secondary | ICD-10-CM | POA: Insufficient documentation

## 2012-11-11 DIAGNOSIS — F17200 Nicotine dependence, unspecified, uncomplicated: Secondary | ICD-10-CM | POA: Insufficient documentation

## 2012-11-11 DIAGNOSIS — Z3202 Encounter for pregnancy test, result negative: Secondary | ICD-10-CM | POA: Insufficient documentation

## 2012-11-11 DIAGNOSIS — Z79899 Other long term (current) drug therapy: Secondary | ICD-10-CM | POA: Insufficient documentation

## 2012-11-11 LAB — CBC WITH DIFFERENTIAL/PLATELET
HCT: 40.4 % (ref 36.0–46.0)
Hemoglobin: 13.4 g/dL (ref 12.0–15.0)
Lymphocytes Relative: 27 % (ref 12–46)
MCHC: 33.2 g/dL (ref 30.0–36.0)
MCV: 84.5 fL (ref 78.0–100.0)
Monocytes Absolute: 1.2 10*3/uL — ABNORMAL HIGH (ref 0.1–1.0)
Monocytes Relative: 9 % (ref 3–12)
Neutro Abs: 8.7 10*3/uL — ABNORMAL HIGH (ref 1.7–7.7)
WBC: 13.9 10*3/uL — ABNORMAL HIGH (ref 4.0–10.5)

## 2012-11-11 NOTE — ED Provider Notes (Signed)
CSN: [id number]     Arrival date & time 11/11/12  2133 History  This chart was scribed for Sunnie Nielsen, MD by Carl Best, ED Scribe. This patient was seen in room APA01/[facility]01 and the patient's care was started at 10:55 PM.     Chief Complaint  Patient presents with  . V70.1    The history is provided by the patient. No language interpreter was used.   HPI Comments: Suzanne Bush is a 27 y.o. female who presents to the Emergency Department complaining of SI that started tonight.  The patient states that she has thought about killing herself but does not plan on doing any harm to herself.  The patient denies taking any medications at home.  The patient confirms marijuana usage.  The patient denies smoking marijuana tonight.  She states that she had a cold this past weekend.  The patient denies being admitted to the hospital in the past for similar symptoms.  The patient denies having a PCP.    History reviewed. No pertinent past medical history. Past Surgical History  Procedure Laterality Date  . Cholecystectomy  after baby was born  . Cesarean section     History reviewed. No pertinent family history. History  Substance Use Topics  . Smoking status: Current Every Day Smoker -- 1.00 packs/day    Types: Cigarettes  . Smokeless tobacco: Not on file  . Alcohol Use: No   OB History   Grav Para Term Preterm Abortions TAB SAB Ect Mult Living   3 1 1  0 2 0 2 0 0 1     Review of Systems  Psychiatric/Behavioral: Positive for suicidal ideas.  All other systems reviewed and are negative.    Allergies  Review of patient's allergies indicates no known allergies.  Home Medications   Current Outpatient Rx  Name  Route  Sig  Dispense  Refill  . Pseudoeph-CPM-DM-APAP (MAPAP COLD FORMULA PO)   Oral   Take 1-2 tablets by mouth daily as needed (for cold symptoms).          Triage Vitals: BP 138/95  Pulse 103  Temp(Src) 98.2 F (36.8 C) (Oral)  Resp 24  Ht 5\' 5"  (1.651 m)   Wt 213 lb (96.616 kg)  BMI 35.44 kg/m2  SpO2 98%  LMP 11/08/2012  Physical Exam  Nursing note and vitals reviewed. Constitutional: She is oriented to person, place, and time. She appears well-developed and well-nourished. No distress.  HENT:  Head: Normocephalic and atraumatic.  Eyes: EOM are normal.  Neck: Neck supple. No tracheal deviation present.  Cardiovascular: Normal rate.   Pulmonary/Chest: Effort normal. No respiratory distress.  Musculoskeletal: Normal range of motion.  Moves all extremities, no trauma or areas of tenderness.   Neurological: She is alert and oriented to person, place, and time.  Skin: Skin is warm and dry.  Psychiatric: She has a normal mood and affect. Her behavior is normal.  Depressed affect.     ED Course  Procedures (including critical care time)  DIAGNOSTIC STUDIES: Oxygen Saturation is 98% on room air, normal by my interpretation.    COORDINATION OF CARE: 10:57 PM- Discussed starting the patient on teletherapy with the patient and the patient agreed to the treatment plan.   Labs Review Labs Reviewed  CBC WITH DIFFERENTIAL - Abnormal; Notable for the following:    WBC 13.9 (*)    Neutro Abs 8.7 (*)    Monocytes Absolute 1.2 (*)    All other components within normal  limits  URINALYSIS, ROUTINE W REFLEX MICROSCOPIC - Abnormal; Notable for the following:    Specific Gravity, Urine >1.030 (*)    Hgb urine dipstick LARGE (*)    All other components within normal limits  URINE RAPID DRUG SCREEN (HOSP PERFORMED) - Abnormal; Notable for the following:    Tetrahydrocannabinol POSITIVE (*)    All other components within normal limits  BASIC METABOLIC PANEL - Abnormal; Notable for the following:    Glucose, Bld 108 (*)    GFR calc non Af Amer 83 (*)    All other components within normal limits  URINE MICROSCOPIC-ADD ON - Abnormal; Notable for the following:    Squamous Epithelial / LPF MANY (*)    Bacteria, UA FEW (*)    All other components  within normal limits  PREGNANCY, URINE  ETHANOL    TTS consulted and plan PSY eval   PT voluntary  MDM  Dx: SI  I personally performed the services described in this documentation, which was scribed in my presence. The recorded information has been reviewed and is accurate.    Sunnie Nielsen, MD 11/12/12 2249

## 2012-11-11 NOTE — ED Notes (Signed)
Pt states she is tired of living and her life does not get any better it gets worse. Pt plan was to drink clorox, she states she could not kill herself, but could make someone mad enough to kill her

## 2012-11-12 DIAGNOSIS — F3160 Bipolar disorder, current episode mixed, unspecified: Secondary | ICD-10-CM

## 2012-11-12 LAB — URINE MICROSCOPIC-ADD ON

## 2012-11-12 LAB — RAPID URINE DRUG SCREEN, HOSP PERFORMED
Amphetamines: NOT DETECTED
Benzodiazepines: NOT DETECTED
Cocaine: NOT DETECTED
Opiates: NOT DETECTED
Tetrahydrocannabinol: POSITIVE — AB

## 2012-11-12 LAB — BASIC METABOLIC PANEL
BUN: 15 mg/dL (ref 6–23)
Chloride: 102 mEq/L (ref 96–112)
Creatinine, Ser: 0.94 mg/dL (ref 0.50–1.10)
Glucose, Bld: 108 mg/dL — ABNORMAL HIGH (ref 70–99)
Potassium: 4 mEq/L (ref 3.5–5.1)

## 2012-11-12 LAB — URINALYSIS, ROUTINE W REFLEX MICROSCOPIC
Bilirubin Urine: NEGATIVE
Specific Gravity, Urine: >1.03 — ABNORMAL HIGH (ref 1.005–1.030)
pH: 6 (ref 5.0–8.0)

## 2012-11-12 MED ORDER — ACETAMINOPHEN 325 MG PO TABS
650.0000 mg | ORAL_TABLET | Freq: Once | ORAL | Status: AC
  Administered 2012-11-12: 650 mg via ORAL
  Filled 2012-11-12: qty 2

## 2012-11-12 NOTE — BH Assessment (Signed)
Dr. Aileen Pilot informed that patient will be evaluated by Verne Spurr, PA approx. 1330 via telepsych.

## 2012-11-12 NOTE — ED Notes (Signed)
TTS consult completed 

## 2012-11-12 NOTE — ED Notes (Signed)
Pt resting in bed, w/ sitter at bedside.

## 2012-11-12 NOTE — ED Notes (Signed)
Pt up to bathroom ,sitter remains at bedside.

## 2012-11-12 NOTE — ED Notes (Signed)
Pt requested telepsych consult.  Pt updated on status.

## 2012-11-12 NOTE — ED Notes (Signed)
Patient reports wishes she did not have her 25 year old child because she felt as if her life would be easier without having the child. States she wants to make someone mad enough to kill her because she doesn't think she could kill herself.

## 2012-11-12 NOTE — ED Notes (Signed)
Pt resting in bed, sitter at bedside

## 2012-11-12 NOTE — ED Notes (Signed)
Pt had cell phone in bed w/ her, phone taken and bagged w/ pt belongings. Sitter at bedside, pt re-wanded by security.
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
## 2012-11-12 NOTE — BH Assessment (Signed)
Per Delorise Jackson, patient needs to be evaluated by a extender-Neil Mashburn, NP.   Writer contacted Lloyd Huger to request a telepsych. She is willing to complete the telepsych but currently swamped  Seeing patients on the unit and also working on completing discharges. Sts she will not be able to see patient within the 2 hour window but will see the patient later today.  Lloyd Huger suggested to call Dr. Nyoka Cowden and Dr. Serena Croissant at the [place] office. Dr. Meyer Russel is on vacation and Dr. Nyoka Cowden is not taking calls b/c he is busy seeing his own patients.   Writer shared the above information with Theodoro Kos. She stated that Lloyd Huger would need to provide a time as to when she is able to evaluate the patient so that this information is communicated/documented for [facility]. Writer called Lloyd Huger  to request an evaluation time. Lloyd Huger agreed to assess this patient approx. 1330 this afternoon.
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
## 2012-11-13 NOTE — Discharge Instructions (Signed)
[facility] with [facility]  

## 2012-11-13 NOTE — Progress Notes (Signed)
ED/CM noted patient did not have health insurance and/or PCP listed in the computer.  Patient was given the [place] resource handout with information on the clinics, food pantries, and the handout for new health insurance sign-up.  Patient expressed appreciation for this. 

## 2012-11-13 NOTE — ED Notes (Signed)
Spoke with [facility] - states waiting on Psychiatrist for consult also needing collateral of safe place to stay before being discharged.  Requesting names and contact numbers for pt's family.  Pt signed a release of  Information and form faxed to Ava at [facility] intake.  Will call when psychiatrist is available for consult.  Delay explained to pt.  Verbalized understanding.  Pt requesting a salad for lunch.  Called dietary.  Pt up to shower with sitter at this time.  Reports she doesn't think her grandfather will kick her out of the house.  States she thinks he just gets mad and says that.  States, "I just worry about where  I will go in the future if he ever decides to kick me out."

## 2012-11-13 NOTE — ED Notes (Signed)
Spoke with Suzanne Bush at [facility] - states pt has already been evaluated by a psychiatrist and pt is okay to be discharged with follow up with [facility].

## 2012-11-13 NOTE — ED Notes (Signed)
Per [facility], patient can be treated as an outpatient at this time. Will discuss with on-coming physician and make a decision after 0700.

## 2012-11-13 NOTE — BH Assessment (Signed)
Writer received the faxed consent to release information sheet from the patient.  Writer obtained collateral information from her cousin Dwana Curd [phone]).  Writer was informed that the family member is able to pick her up today between 3:00 and 4:00pm.    Writer contacted the nurse working with the patient Donna Christen) and informed her that the family will allow the patient to come back to their home.  Writer informed me that she would inform the ER MD that the patient is able to be discharged home with referrals to [facility].

## 2013-12-01 ENCOUNTER — Encounter (HOSPITAL_COMMUNITY): Payer: Self-pay | Admitting: Emergency Medicine

## 2014-03-30 ENCOUNTER — Encounter (HOSPITAL_BASED_OUTPATIENT_CLINIC_OR_DEPARTMENT_OTHER): Payer: Self-pay | Admitting: *Deleted

## 2014-03-30 ENCOUNTER — Emergency Department (HOSPITAL_BASED_OUTPATIENT_CLINIC_OR_DEPARTMENT_OTHER)
Admission: EM | Admit: 2014-03-30 | Discharge: 2014-03-30 | Disposition: A | Discharge status: Home / Self Care | Payer: No Typology Code available for payment source | Attending: Emergency Medicine | Admitting: Emergency Medicine

## 2014-03-30 DIAGNOSIS — M545 Low back pain, unspecified: Secondary | ICD-10-CM

## 2014-03-30 DIAGNOSIS — Z720 Tobacco use: Secondary | ICD-10-CM | POA: Insufficient documentation

## 2014-03-30 DIAGNOSIS — Z3202 Encounter for pregnancy test, result negative: Secondary | ICD-10-CM | POA: Diagnosis not present

## 2014-03-30 DIAGNOSIS — Z79899 Other long term (current) drug therapy: Secondary | ICD-10-CM | POA: Insufficient documentation

## 2014-03-30 LAB — URINALYSIS, ROUTINE W REFLEX MICROSCOPIC
Bilirubin Urine: NEGATIVE
Glucose, UA: NEGATIVE mg/dL
Hgb urine dipstick: NEGATIVE
Ketones, ur: NEGATIVE mg/dL
LEUKOCYTES UA: NEGATIVE
NITRITE: NEGATIVE
Protein, ur: NEGATIVE mg/dL
SPECIFIC GRAVITY, URINE: 1.027 (ref 1.005–1.030)
Urobilinogen, UA: 0.2 mg/dL (ref 0.0–1.0)
pH: 6.5 (ref 5.0–8.0)

## 2014-03-30 LAB — PREGNANCY, URINE: Preg Test, Ur: NEGATIVE

## 2014-03-30 MED ORDER — KETOROLAC TROMETHAMINE 30 MG/ML IJ SOLN
30.0000 mg | Freq: Once | INTRAMUSCULAR | Status: AC
  Administered 2014-03-30: 30 mg via INTRAMUSCULAR
  Filled 2014-03-30: qty 1

## 2014-03-30 NOTE — ED Notes (Signed)
Pt c/o lower back pain  X 3 days

## 2014-03-30 NOTE — Discharge Instructions (Signed)
Take 600 mg of ibuprofen every 6 hours for the next few days.

## 2014-03-30 NOTE — ED Provider Notes (Signed)
CSN: [id number]     Arrival date & time 03/30/14  1547 History  This chart was scribed for Purvis Sheffield, MD by Tonye Royalty, ED Scribe. This patient was seen in room MH08/MH08 and the patient's care was started at 4:26 PM.    Chief Complaint  Patient presents with  . Back Pain   Patient is a 29 y.o. female presenting with back pain. The history is provided by the patient. No language interpreter was used.  Back Pain Location:  Lumbar spine Quality:  Aching Radiates to:  Does not radiate Pain severity:  Mild Pain is:  Same all the time Onset quality:  Gradual Duration:  3 days Timing:  Intermittent Progression:  Worsening Chronicity:  New Context: not physical stress and not recent injury   Relieved by:  None tried Worsened by:  Movement and bending Ineffective treatments:  None tried Associated symptoms: no abdominal pain, no chest pain, no dysuria, no fever, no headaches and no weakness   Risk factors: no hx of cancer     HPI Comments: JANIE CAPP is a 29 y.o. female who presents to the Emergency Department complaining of intermittent low back pain with gradual onset 3 days ago. She describes it as aching and states it affects both sides. She states it is worse with movement, especially bending over or getting up. She denies doing anything that might have hurt it, but notes she does have a SUV and getting in and out requires some exertion. She has not used any medication for her pain. She denies drug use. She denies history of DM or cancer. She denies other medical problems and states she does not use any medications regularly. She reports prior cholecystectomy. She denies vomiting, diarrhea, fever, dysuria, or weakness.  History reviewed. No pertinent past medical history. Past Surgical History  Procedure Laterality Date  . Cholecystectomy  after baby was born  . Cesarean section     History reviewed. No pertinent family history. History  Substance Use Topics  .  Smoking status: Current Every Day Smoker -- 1.00 packs/day    Types: Cigarettes  . Smokeless tobacco: Not on file  . Alcohol Use: No   OB History    Gravida Para Term Preterm AB TAB SAB Ectopic Multiple Living   0 2 0 2 0 0 1     Review of Systems  Constitutional: Negative for fever, appetite change and fatigue.  HENT: Negative for congestion, ear discharge and sinus pressure.   Eyes: Negative for discharge.  Respiratory: Negative for cough.   Cardiovascular: Negative for chest pain.  Gastrointestinal: Negative for vomiting, abdominal pain and diarrhea.  Genitourinary: Negative for dysuria, frequency and hematuria.  Musculoskeletal: Positive for back pain.  Skin: Negative for rash.  Neurological: Negative for seizures, weakness and headaches.  Psychiatric/Behavioral: Negative for hallucinations.  All other systems reviewed and are negative.     Allergies  Review of patient's allergies indicates no known allergies.  Home Medications   Prior to Admission medications   Medication Sig Start Date End Date Taking? Authorizing Provider  Pseudoeph-CPM-DM-APAP (MAPAP COLD FORMULA PO) Take 1-2 tablets by mouth daily as needed (for cold symptoms).    Historical Provider, MD   BP 121/70 mmHg  Pulse 86  Temp(Src) 98.2 F (36.8 C)  Resp 16  Ht  (1.651 m)  Wt 216 lb (97.977 kg)  BMI 35.94 kg/m2  SpO2 99%  LMP 03/04/2014 Physical Exam  Constitutional: She appears well-developed and well-nourished.  No distress.  HENT:  Head: Normocephalic and atraumatic.  Mouth/Throat: Oropharynx is clear and moist. No oropharyngeal exudate.  Eyes: Conjunctivae and EOM are normal. Pupils are equal, round, and reactive to light. Right eye exhibits no discharge. Left eye exhibits no discharge. No scleral icterus.  Neck: Normal range of motion. Neck supple. No JVD present. No thyromegaly present.  Cardiovascular: Normal rate, regular rhythm, normal heart sounds and intact distal pulses.   Exam reveals no gallop and no friction rub.   No murmur heard. Pulmonary/Chest: Effort normal and breath sounds normal. No respiratory distress. She has no wheezes. She has no rales.  Abdominal: Soft. Bowel sounds are normal. She exhibits no distension and no mass. There is no tenderness.  Musculoskeletal: Normal range of motion. She exhibits tenderness. She exhibits no edema.  Midline lower sacral TTP. Normal strength and sensation in bilateral LEs. Pain reproducable with bending.  Lymphadenopathy:    She has no cervical adenopathy.  Neurological: She is alert. Coordination normal.  Skin: Skin is warm and dry. No rash noted. No erythema.  Psychiatric: She has a normal mood and affect. Her behavior is normal.  Nursing note and vitals reviewed.   ED Course  Procedures (including critical care time)  DIAGNOSTIC STUDIES: Oxygen Saturation is 99% on room air, normal by my interpretation.    COORDINATION OF CARE: 4:33 PM Discussed treatment plan with patient at beside, including UA. The patient agrees with the plan and has no further questions at this time.   Labs Review Labs Reviewed  PREGNANCY, URINE  URINALYSIS, ROUTINE W REFLEX MICROSCOPIC    Imaging Review No results found.   EKG Interpretation None      MDM   Final diagnoses:  Midline low back pain without sciatica    5:22 PM 29 y.o. female  Here with low back pain began several days ago. No back pain red flags. Vital signs unremarkable here. She  Denies any specific injury although pain is reproducible upon bending. Likely musculoskeletal in nature. Urinalysis noncontributory and pregnancy negative. Will recommend ibuprofen on a scheduled regimen over the next few days.  5:23 PM:  I have discussed the diagnosis/risks/treatment options with the patient and believe the pt to be eligible for discharge home to follow-up with her pcp as needed. We also discussed returning to the ED immediately if new or worsening sx  occur. We discussed the sx which are most concerning (e.g., worsening pain, weakness, numbness, fever) that necessitate immediate return. Medications administered to the patient during their visit and any new prescriptions provided to the patient are listed below.  Medications given during this visit Medications  ketorolac (TORADOL) 30 MG/ML injection 30 mg (30 mg Intramuscular Given 03/30/14 1718)    New Prescriptions   No medications on file      I personally performed the services described in this documentation, which was scribed in my presence. The recorded information has been reviewed and is accurate.    Purvis SheffieldForrest Ani Deoliveira, MD 03/30/14 [phone]

## 2015-01-27 ENCOUNTER — Encounter (HOSPITAL_COMMUNITY): Payer: Self-pay | Admitting: *Deleted

## 2015-01-27 ENCOUNTER — Inpatient Hospital Stay (HOSPITAL_COMMUNITY)
Admission: AD | Admit: 2015-01-27 | Discharge: 2015-01-27 | Disposition: A | Discharge status: Home / Self Care | Payer: No Typology Code available for payment source | Source: Ambulatory Visit | Attending: Obstetrics & Gynecology | Admitting: Obstetrics & Gynecology

## 2015-01-27 DIAGNOSIS — Z3201 Encounter for pregnancy test, result positive: Secondary | ICD-10-CM | POA: Insufficient documentation

## 2015-01-27 LAB — POCT PREGNANCY, URINE: Preg Test, Ur: POSITIVE — AB

## 2015-01-27 NOTE — MAU Note (Signed)
Pt presents to MAU stating that she wants a pregnancy test. 2 faint positive tests at home. Denies any pain or bleeding

## 2015-03-11 ENCOUNTER — Encounter (HOSPITAL_COMMUNITY): Payer: Self-pay | Admitting: *Deleted

## 2015-03-11 ENCOUNTER — Inpatient Hospital Stay (HOSPITAL_COMMUNITY)
Admission: AD | Admit: 2015-03-11 | Discharge: 2015-03-11 | Disposition: A | Discharge status: Home / Self Care | Payer: Medicaid Other | Source: Ambulatory Visit | Attending: Obstetrics & Gynecology | Admitting: Obstetrics & Gynecology

## 2015-03-11 DIAGNOSIS — K219 Gastro-esophageal reflux disease without esophagitis: Secondary | ICD-10-CM | POA: Diagnosis not present

## 2015-03-11 DIAGNOSIS — O9989 Other specified diseases and conditions complicating pregnancy, childbirth and the puerperium: Secondary | ICD-10-CM | POA: Diagnosis not present

## 2015-03-11 DIAGNOSIS — F17210 Nicotine dependence, cigarettes, uncomplicated: Secondary | ICD-10-CM | POA: Insufficient documentation

## 2015-03-11 DIAGNOSIS — O99331 Smoking (tobacco) complicating pregnancy, first trimester: Secondary | ICD-10-CM | POA: Insufficient documentation

## 2015-03-11 DIAGNOSIS — Z3A10 10 weeks gestation of pregnancy: Secondary | ICD-10-CM | POA: Diagnosis not present

## 2015-03-11 DIAGNOSIS — O26891 Other specified pregnancy related conditions, first trimester: Secondary | ICD-10-CM | POA: Diagnosis not present

## 2015-03-11 LAB — URINALYSIS, ROUTINE W REFLEX MICROSCOPIC
BILIRUBIN URINE: NEGATIVE
GLUCOSE, UA: NEGATIVE mg/dL
HGB URINE DIPSTICK: NEGATIVE
KETONES UR: NEGATIVE mg/dL
Leukocytes, UA: NEGATIVE
Nitrite: NEGATIVE
PH: 5.5 (ref 5.0–8.0)
Protein, ur: NEGATIVE mg/dL
Specific Gravity, Urine: >1.03 — ABNORMAL HIGH (ref 1.005–1.030)

## 2015-03-11 MED ORDER — GI COCKTAIL ~~LOC~~
30.0000 mL | Freq: Once | ORAL | Status: AC
  Administered 2015-03-11: 30 mL via ORAL
  Filled 2015-03-11: qty 30

## 2015-03-11 MED ORDER — RANITIDINE HCL 150 MG PO TABS: 150.0000 mg | ORAL_TABLET | Freq: Two times a day (BID) | ORAL | Status: DC

## 2015-03-11 NOTE — MAU Note (Signed)
Pt presents to MAU with complaints of pain in the upper portion of her abdomen radiating into her sides. Denies any vaginal bleeding

## 2015-03-11 NOTE — Discharge Instructions (Signed)
Take the medication as directed. Follow up with Dr. Gaynell Face as scheduled. Return as needed for problems.

## 2015-03-11 NOTE — MAU Provider Note (Signed)
CSN: [id number]     Arrival date & time 03/11/15  1536 History   None    Chief Complaint  Patient presents with  . Abdominal Pain     (Consider location/radiation/quality/duration/timing/severity/associated sxs/prior Treatment) The history is provided by the patient.   Suzanne Bush is a 30 y.o. [phone] @ [redacted]w[redacted]d  who presents to the MAU with burning in the upper abdomen that comes and goes for the past 2 weeks. Patient reports that she eats a lot of hamburger and FF. She has taken nothing for the burning. She has had her gall bladder removed.  Patient scheduled for her [facility] visit with Dr. Gaynell Face next week.   History reviewed. No pertinent past medical history. Past Surgical History  Procedure Laterality Date  . Cholecystectomy  after baby was born  . Cesarean section     History reviewed. No pertinent family history. Social History  Substance Use Topics  . Smoking status: Current Every Day Smoker -- 1.00 packs/day    Types: Cigarettes  . Smokeless tobacco: None  . Alcohol Use: No   OB History    Gravida Para Term Preterm AB TAB SAB Ectopic Multiple Living   0 2 0 2 0 0 1     Review of Systems  Gastrointestinal: Positive for abdominal pain.  all other systems negataive    Allergies  Review of patient's allergies indicates no known allergies.  Home Medications   Prior to Admission medications   Medication Sig Start Date End Date Taking? Authorizing Provider  acetaminophen (TYLENOL) 325 MG tablet Take 650 mg by mouth every 6 (six) hours as needed for moderate pain.   Yes Historical Provider, MD  Prenatal Vit-Fe Fumarate-FA (PRENATAL MULTIVITAMIN) TABS tablet Take 1 tablet by mouth daily at 12 noon.   Yes Historical Provider, MD  Pseudoeph-CPM-DM-APAP (MAPAP COLD FORMULA PO) Take 1-2 tablets by mouth daily as needed (for cold symptoms). Reported on 03/11/2015    Historical Provider, MD  ranitidine (ZANTAC) 150 MG tablet Take 1 tablet (150 mg total) by mouth 2  (two) times daily. 03/11/15   Kirat Mezquita Orlene Och, NP   BP 143/86 mmHg  Pulse 74  Temp(Src) 99 F (37.2 C)  Resp 18  LMP 12/25/2014 Physical Exam  Constitutional: She is oriented to person, place, and time. She appears well-developed and well-nourished.  HENT:  Head: Normocephalic.  Eyes: EOM are normal.  Neck: Normal range of motion. Neck supple.  Cardiovascular: Normal rate and regular rhythm.   Pulmonary/Chest: Effort normal and breath sounds normal.  Abdominal: Soft. Bowel sounds are normal. There is tenderness. There is no rebound and no guarding.  Mildly tender with deep palpation epigastric area.  Positive FHT's 168, low midline. No lower abdominal pain.   Musculoskeletal: Normal range of motion.  Neurological: She is alert and oriented to person, place, and time. No cranial nerve deficit.  Skin: Skin is warm and dry.  Psychiatric: She has a normal mood and affect. Her behavior is normal.  Nursing note and vitals reviewed.   ED Course  Procedures (including critical care time) Labs Review Results for orders placed or performed during the hospital encounter of 03/11/15 (from the past 24 hour(s))  Urinalysis, Routine w reflex microscopic (not at [facility])     Status: Abnormal   Collection Time: 03/11/15  4:55 PM  Result Value Ref Range   Color, Urine YELLOW YELLOW   APPearance CLEAR CLEAR   Specific Gravity, Urine >1.030 (H) 1.005 - 1.030  pH 5.5 5.0 - 8.0   Glucose, UA NEGATIVE NEGATIVE mg/dL   Hgb urine dipstick NEGATIVE NEGATIVE   Bilirubin Urine NEGATIVE NEGATIVE   Ketones, ur NEGATIVE NEGATIVE mg/dL   Protein, ur NEGATIVE NEGATIVE mg/dL   Nitrite NEGATIVE NEGATIVE   Leukocytes, UA NEGATIVE NEGATIVE    Symptoms improved with GI cocktail  MDM  29 y.o. [id number] @ [redacted]w[redacted]d gestation with epigastric pain and no other problems. Will treat with Zantac 150 mg PO BID and she will follow up with Dr. Gaynell Face as scheduled. She will return here for worsening symptoms.   Final  diagnoses:  Gastroesophageal reflux disease without esophagitis

## 2015-03-25 LAB — OB RESULTS CONSOLE GC/CHLAMYDIA
CHLAMYDIA, DNA PROBE: NEGATIVE
Gonorrhea: NEGATIVE

## 2015-03-25 LAB — OB RESULTS CONSOLE HEPATITIS B SURFACE ANTIGEN: HEP B S AG: NEGATIVE

## 2015-03-25 LAB — OB RESULTS CONSOLE ABO/RH: RH TYPE: POSITIVE

## 2015-03-25 LAB — OB RESULTS CONSOLE ANTIBODY SCREEN: ANTIBODY SCREEN: NEGATIVE

## 2015-03-25 LAB — OB RESULTS CONSOLE HIV ANTIBODY (ROUTINE TESTING): HIV: NONREACTIVE

## 2015-03-25 LAB — OB RESULTS CONSOLE RUBELLA ANTIBODY, IGM: Rubella: IMMUNE

## 2015-03-25 LAB — SICKLE CELL SCREEN: SICKLE CELL SCREEN: NEGATIVE

## 2015-04-09 ENCOUNTER — Inpatient Hospital Stay (HOSPITAL_COMMUNITY): Payer: Medicaid Other

## 2015-04-09 ENCOUNTER — Inpatient Hospital Stay (HOSPITAL_COMMUNITY)
Admission: AD | Admit: 2015-04-09 | Discharge: 2015-04-09 | Disposition: A | Discharge status: Home / Self Care | Payer: Medicaid Other | Source: Ambulatory Visit | Attending: Obstetrics | Admitting: Obstetrics

## 2015-04-09 ENCOUNTER — Encounter (HOSPITAL_COMMUNITY): Payer: Self-pay | Admitting: *Deleted

## 2015-04-09 DIAGNOSIS — O26892 Other specified pregnancy related conditions, second trimester: Secondary | ICD-10-CM | POA: Insufficient documentation

## 2015-04-09 DIAGNOSIS — O9989 Other specified diseases and conditions complicating pregnancy, childbirth and the puerperium: Secondary | ICD-10-CM | POA: Diagnosis not present

## 2015-04-09 DIAGNOSIS — R9431 Abnormal electrocardiogram [ECG] [EKG]: Secondary | ICD-10-CM

## 2015-04-09 DIAGNOSIS — Z8249 Family history of ischemic heart disease and other diseases of the circulatory system: Secondary | ICD-10-CM | POA: Diagnosis not present

## 2015-04-09 DIAGNOSIS — O26899 Other specified pregnancy related conditions, unspecified trimester: Secondary | ICD-10-CM

## 2015-04-09 DIAGNOSIS — F17210 Nicotine dependence, cigarettes, uncomplicated: Secondary | ICD-10-CM | POA: Diagnosis not present

## 2015-04-09 DIAGNOSIS — Z3A15 15 weeks gestation of pregnancy: Secondary | ICD-10-CM | POA: Diagnosis not present

## 2015-04-09 DIAGNOSIS — O99332 Smoking (tobacco) complicating pregnancy, second trimester: Secondary | ICD-10-CM | POA: Diagnosis not present

## 2015-04-09 DIAGNOSIS — R079 Chest pain, unspecified: Secondary | ICD-10-CM

## 2015-04-09 DIAGNOSIS — R109 Unspecified abdominal pain: Secondary | ICD-10-CM

## 2015-04-09 LAB — CBC
HCT: 34.3 % — ABNORMAL LOW (ref 36.0–46.0)
HEMOGLOBIN: 11.4 g/dL — AB (ref 12.0–15.0)
MCH: 27.6 pg (ref 26.0–34.0)
MCHC: 33.2 g/dL (ref 30.0–36.0)
MCV: 83.1 fL (ref 78.0–100.0)
Platelets: 296 10*3/uL (ref 150–400)
RBC: 4.13 MIL/uL (ref 3.87–5.11)
RDW: 14.3 % (ref 11.5–15.5)
WBC: 15.3 10*3/uL — AB (ref 4.0–10.5)

## 2015-04-09 LAB — URINALYSIS, ROUTINE W REFLEX MICROSCOPIC
Bilirubin Urine: NEGATIVE
Glucose, UA: NEGATIVE mg/dL
Ketones, ur: NEGATIVE mg/dL
Leukocytes, UA: NEGATIVE
Nitrite: NEGATIVE
PROTEIN: NEGATIVE mg/dL
pH: 6 (ref 5.0–8.0)

## 2015-04-09 LAB — URINE MICROSCOPIC-ADD ON: BACTERIA UA: NONE SEEN

## 2015-04-09 LAB — COMPREHENSIVE METABOLIC PANEL
ALBUMIN: 3.1 g/dL — AB (ref 3.5–5.0)
ALK PHOS: 60 U/L (ref 38–126)
ALT: 11 U/L — ABNORMAL LOW (ref 14–54)
AST: 12 U/L — AB (ref 15–41)
Anion gap: 2 — ABNORMAL LOW (ref 5–15)
BILIRUBIN TOTAL: 0.3 mg/dL (ref 0.3–1.2)
BUN: 11 mg/dL (ref 6–20)
CALCIUM: 9 mg/dL (ref 8.9–10.3)
CO2: 25 mmol/L (ref 22–32)
Chloride: 108 mmol/L (ref 101–111)
Creatinine, Ser: 0.67 mg/dL (ref 0.44–1.00)
GFR calc Af Amer: >60 mL/min (ref >60)
GFR calc non Af Amer: >60 mL/min (ref >60)
GLUCOSE: 94 mg/dL (ref 65–99)
Potassium: 4.3 mmol/L (ref 3.5–5.1)
Sodium: 135 mmol/L (ref 135–145)
TOTAL PROTEIN: 6.8 g/dL (ref 6.5–8.1)

## 2015-04-09 LAB — ECHOCARDIOGRAM COMPLETE

## 2015-04-09 MED ORDER — GI COCKTAIL ~~LOC~~
30.0000 mL | Freq: Once | ORAL | Status: AC
  Administered 2015-04-09: 30 mL via ORAL
  Filled 2015-04-09: qty 30

## 2015-04-09 MED ORDER — CYCLOBENZAPRINE HCL 10 MG PO TABS: 10.0000 mg | ORAL_TABLET | Freq: Two times a day (BID) | ORAL | Status: DC | PRN

## 2015-04-09 NOTE — Discharge Instructions (Signed)

## 2015-04-09 NOTE — MAU Provider Note (Signed)
History     CSN: [id number]  Arrival date and time: 04/09/15 [id number]   First Provider Initiated Contact with Patient 04/09/15 1022       Chief Complaint  Patient presents with  . Abdominal Pain  . Chest Pain   HPI Comments: Suzanne Bush is a 30 y.o. G4P1021 at 4323w0d who presents for chest pain & abdominal pain.    Chest Pain  This is a new problem. The current episode started today. The onset quality is sudden. The problem occurs constantly. The problem has been unchanged. The pain is present in the substernal region. The pain is at a severity of 7/10. The quality of the pain is described as stabbing. The pain does not radiate. Associated symptoms include abdominal pain (lower abdominal cramping). Pertinent negatives include no back pain, cough, diaphoresis, dizziness, fever, headaches, irregular heartbeat, nausea, near-syncope, orthopnea, palpitations, shortness of breath, sputum production, syncope or vomiting. The pain is aggravated by nothing. She has tried nothing for the symptoms. Risk factors include obesity and smoking/tobacco exposure.  Pertinent negatives for past medical history include no congenital heart disease, no diabetes, no hypertension and no MI.  Her family medical history is significant for hypertension.  Pertinent negatives for family medical history include: no early MI. Prior diagnostic workup includes echocardiogram.  Abdominal Pain Episode onset: x 1 month. The problem occurs daily. The problem has been waxing and waning. The pain is located in the suprapubic region. The pain is at a severity of 7/10. The quality of the pain is cramping and sharp. The abdominal pain does not radiate. Pertinent negatives include no constipation, diarrhea, dysuria, fever, frequency, headaches, hematuria, nausea or vomiting. Nothing aggravates the pain. The pain is relieved by nothing. She has tried nothing for the symptoms.      OB History    Gravida Para Term Preterm AB TAB SAB  Ectopic Multiple Living   4 1 1  0 2 0 2 0 0 1      History reviewed. No pertinent past medical history.  Past Surgical History  Procedure Laterality Date  . Cholecystectomy  after baby was born  . Cesarean section      History reviewed. No pertinent family history.  Social History  Substance Use Topics  . Smoking status: Current Every Day Smoker -- 1.00 packs/day    Types: Cigarettes  . Smokeless tobacco: None  . Alcohol Use: No    Allergies: No Known Allergies  Prescriptions prior to admission  Medication Sig Dispense Refill Last Dose  . Prenatal Vit-Fe Fumarate-FA (PRENATAL MULTIVITAMIN) TABS tablet Take 1 tablet by mouth daily at 12 noon.   04/09/2015 at Unknown time  . acetaminophen (TYLENOL) 325 MG tablet Take 650 mg by mouth every 6 (six) hours as needed for moderate pain.   prn  . ranitidine (ZANTAC) 150 MG tablet Take 1 tablet (150 mg total) by mouth 2 (two) times daily. (Patient not taking: Reported on 04/09/2015) 60 tablet 0 Not Taking at Unknown time    Review of Systems  Constitutional: Negative.  Negative for fever and diaphoresis.  Respiratory: Negative for cough, sputum production and shortness of breath.   Cardiovascular: Positive for chest pain. Negative for palpitations, orthopnea, leg swelling, syncope and near-syncope.  Gastrointestinal: Positive for abdominal pain (lower abdominal cramping). Negative for nausea, vomiting, diarrhea and constipation.  Genitourinary: Negative for dysuria, frequency and hematuria.       No vaginal bleeding or LOF  Musculoskeletal: Negative for back pain.  Neurological: Negative for  dizziness and headaches.   Physical Exam   Blood pressure 118/67, pulse 73, temperature 97.7 F (36.5 C), resp. rate 18, last menstrual period 12/25/2014, SpO2 100 %.  Physical Exam  Nursing note and vitals reviewed. Constitutional: She is oriented to person, place, and time. She appears well-developed and well-nourished. No distress.  HENT:   Head: Normocephalic and atraumatic.  Eyes: Conjunctivae are normal. Right eye exhibits no discharge. Left eye exhibits no discharge. No scleral icterus.  Neck: Normal range of motion.  Cardiovascular: Normal rate, regular rhythm and normal heart sounds.   No murmur heard. Respiratory: Effort normal and breath sounds normal. No respiratory distress. She has no wheezes.  GI: Soft. Bowel sounds are normal. She exhibits no distension. There is no tenderness.  Genitourinary:  Cervix closed  Neurological: She is alert and oriented to person, place, and time.  Skin: Skin is warm and dry. She is not diaphoretic.  Psychiatric: She has a normal mood and affect. Her behavior is normal. Judgment and thought content normal.    MAU Course  Procedures Results for orders placed or performed during the hospital encounter of 04/09/15 (from the past 24 hour(s))  Urinalysis, Routine w reflex microscopic (not at [facility])     Status: Abnormal   Collection Time: 04/09/15  9:40 AM  Result Value Ref Range   Color, Urine YELLOW YELLOW   APPearance CLEAR CLEAR   Specific Gravity, Urine >1.030 (H) 1.005 - 1.030   pH 6.0 5.0 - 8.0   Glucose, UA NEGATIVE NEGATIVE mg/dL   Hgb urine dipstick TRACE (A) NEGATIVE   Bilirubin Urine NEGATIVE NEGATIVE   Ketones, ur NEGATIVE NEGATIVE mg/dL   Protein, ur NEGATIVE NEGATIVE mg/dL   Nitrite NEGATIVE NEGATIVE   Leukocytes, UA NEGATIVE NEGATIVE  Urine microscopic-add on     Status: Abnormal   Collection Time: 04/09/15  9:40 AM  Result Value Ref Range   Squamous Epithelial / LPF 0-5 (A) NONE SEEN   WBC, UA 0-5 0 - 5 WBC/hpf   RBC / HPF 0-5 0 - 5 RBC/hpf   Bacteria, UA NONE SEEN NONE SEEN  CBC     Status: Abnormal   Collection Time: 04/09/15 10:41 AM  Result Value Ref Range   WBC 15.3 (H) 4.0 - 10.5 K/uL   RBC 4.13 3.87 - 5.11 MIL/uL   Hemoglobin 11.4 (L) 12.0 - 15.0 g/dL   HCT 81.1 (L) 91.4 - 78.2 %   MCV 83.1 78.0 - 100.0 fL   MCH 27.6 26.0 - 34.0 pg   MCHC 33.2  30.0 - 36.0 g/dL   RDW 95.6 21.3 - 08.6 %   Platelets 296 150 - 400 K/uL  Comprehensive metabolic panel     Status: Abnormal   Collection Time: 04/09/15 10:41 AM  Result Value Ref Range   Sodium 135 135 - 145 mmol/L   Potassium 4.3 3.5 - 5.1 mmol/L   Chloride 108 101 - 111 mmol/L   CO2 25 22 - 32 mmol/L   Glucose, Bld 94 65 - 99 mg/dL   BUN 11 6 - 20 mg/dL   Creatinine, Ser 5.78 0.44 - 1.00 mg/dL   Calcium 9.0 8.9 - 46.9 mg/dL   Total Protein 6.8 6.5 - 8.1 g/dL   Albumin 3.1 (L) 3.5 - 5.0 g/dL   AST 12 (L) 15 - 41 U/L   ALT 11 (L) 14 - 54 U/L   Alkaline Phosphatase 60 38 - 126 U/L   Total Bilirubin 0.3 0.3 - 1.2 mg/dL  GFR calc non Af Amer >60 >60 mL/min   GFR calc Af Amer >60 >60 mL/min   Anion gap 2 (L) 5 - 15    MDM FHT 154 by doppler EKG - reviewed with Dr. Mayford Knife (cardiology); results most likely d/t placement of leads in obese patient but recommends 2D echo.  Echo normal Cervix closed GI cocktail  - no improvement Pain reproducible with palpation.  VSS. Pt in no apparent distress.  Assessment and Plan  A: 1. Abdominal pain affecting pregnancy   2. Abnormal EKG   3. Chest pain, unspecified chest pain type     P: Discharge home F/u with Dr. Gaynell Face as scheduled Discussed reasons to return to MAU vs ED Rx flexeril  Judeth Horn 04/09/2015, 10:22 AM

## 2015-04-09 NOTE — MAU Note (Signed)
Pt presents to MAU with complaints of lower abdominal cramping and chest pain on and off for a couple of weeks. Denies any vaginal bleeding or abnormal discharge

## 2015-04-09 NOTE — Progress Notes (Signed)
  Echocardiogram 2D Echocardiogram has been performed.  Tye SavoyCasey N Suzanne Bush 04/09/2015, 12:40 PM

## 2015-05-10 ENCOUNTER — Encounter (HOSPITAL_COMMUNITY): Payer: Self-pay | Admitting: *Deleted

## 2015-05-10 ENCOUNTER — Inpatient Hospital Stay (HOSPITAL_COMMUNITY)
Admission: AD | Admit: 2015-05-10 | Discharge: 2015-05-10 | Disposition: A | Discharge status: Home / Self Care | Payer: Medicaid Other | Source: Ambulatory Visit | Attending: Obstetrics | Admitting: Obstetrics

## 2015-05-10 DIAGNOSIS — O23592 Infection of other part of genital tract in pregnancy, second trimester: Secondary | ICD-10-CM | POA: Insufficient documentation

## 2015-05-10 DIAGNOSIS — N939 Abnormal uterine and vaginal bleeding, unspecified: Secondary | ICD-10-CM | POA: Diagnosis present

## 2015-05-10 DIAGNOSIS — Z3A19 19 weeks gestation of pregnancy: Secondary | ICD-10-CM | POA: Diagnosis not present

## 2015-05-10 DIAGNOSIS — O99332 Smoking (tobacco) complicating pregnancy, second trimester: Secondary | ICD-10-CM | POA: Insufficient documentation

## 2015-05-10 DIAGNOSIS — A499 Bacterial infection, unspecified: Secondary | ICD-10-CM

## 2015-05-10 DIAGNOSIS — B9689 Other specified bacterial agents as the cause of diseases classified elsewhere: Secondary | ICD-10-CM | POA: Insufficient documentation

## 2015-05-10 DIAGNOSIS — O26852 Spotting complicating pregnancy, second trimester: Secondary | ICD-10-CM | POA: Diagnosis not present

## 2015-05-10 DIAGNOSIS — N760 Acute vaginitis: Secondary | ICD-10-CM | POA: Diagnosis not present

## 2015-05-10 DIAGNOSIS — Z9049 Acquired absence of other specified parts of digestive tract: Secondary | ICD-10-CM | POA: Insufficient documentation

## 2015-05-10 LAB — WET PREP, GENITAL
SPERM: NONE SEEN
TRICH WET PREP: NONE SEEN

## 2015-05-10 LAB — URINE MICROSCOPIC-ADD ON

## 2015-05-10 LAB — URINALYSIS, ROUTINE W REFLEX MICROSCOPIC
Bilirubin Urine: NEGATIVE
GLUCOSE, UA: NEGATIVE mg/dL
KETONES UR: NEGATIVE mg/dL
LEUKOCYTES UA: NEGATIVE
Nitrite: NEGATIVE
PROTEIN: NEGATIVE mg/dL
Specific Gravity, Urine: >1.03 — ABNORMAL HIGH (ref 1.005–1.030)
pH: 5 (ref 5.0–8.0)

## 2015-05-10 MED ORDER — METRONIDAZOLE 500 MG PO TABS: 500.0000 mg | ORAL_TABLET | Freq: Two times a day (BID) | ORAL | Status: DC

## 2015-05-10 NOTE — MAU Provider Note (Signed)
Chief Complaint: Vaginal Bleeding   First Provider Initiated Contact with Patient 05/10/15 1502      SUBJECTIVE HPI: Suzanne Bush is a 30 y.o. G4P1021 at 103w3d by LMP who presents to maternity admissions reporting light red bleeding noted when wiping yesterday, none today. She also reports diarrhea 2-3 times yesterday and today. She denies sick contacts. She has not tried any treatments.  She reports good fetal movement, denies vaginal itching/burning, urinary symptoms, h/a, dizziness, n/v, or fever/chills.     HPI  Past Medical History  Diagnosis Date  . Medical history non-contributory    Past Surgical History  Procedure Laterality Date  . Cholecystectomy  after baby was born  . Cesarean section     Social History   Social History  . Marital Status: Single    Spouse Name: N/A  . Number of Children: N/A  . Years of Education: N/A   Occupational History  . Not on file.   Social History Main Topics  . Smoking status: Current Every Day Smoker -- 0.25 packs/day    Types: Cigarettes  . Smokeless tobacco: Not on file  . Alcohol Use: No  . Drug Use: No  . Sexual Activity: Yes    Birth Control/ Protection: Condom, None   Other Topics Concern  . Not on file   Social History Narrative   No current facility-administered medications on file prior to encounter.   Current Outpatient Prescriptions on File Prior to Encounter  Medication Sig Dispense Refill  . Prenatal Vit-Fe Fumarate-FA (PRENATAL MULTIVITAMIN) TABS tablet Take 1 tablet by mouth daily at 12 noon.    . cyclobenzaprine (FLEXERIL) 10 MG tablet Take 1 tablet (10 mg total) by mouth 2 (two) times daily as needed for muscle spasms. (Patient not taking: Reported on 05/10/2015) 20 tablet 0  . ranitidine (ZANTAC) 150 MG tablet Take 1 tablet (150 mg total) by mouth 2 (two) times daily. (Patient not taking: Reported on 04/09/2015) 60 tablet 0   No Known Allergies  ROS:  Review of Systems  Constitutional: Negative for  fever, chills and fatigue.  Respiratory: Negative for shortness of breath.   Cardiovascular: Negative for chest pain.  Gastrointestinal: Positive for diarrhea. Negative for nausea and vomiting.  Genitourinary: Positive for vaginal bleeding. Negative for dysuria, flank pain, vaginal discharge, difficulty urinating, vaginal pain and pelvic pain.  Neurological: Negative for dizziness and headaches.  Psychiatric/Behavioral: Negative.      I have reviewed patient's Past Medical Hx, Surgical Hx, Family Hx, Social Hx, medications and allergies.   Physical Exam   Patient Vitals for the past 24 hrs:  BP Temp Temp src Pulse Resp Height Weight  05/10/15 1318 121/68 mmHg 98.6 F (37 C) Oral 100 18 5' 3.5" (1.613 m) 226 lb 9.6 oz (102.785 kg)   Constitutional: Well-developed, well-nourished female in no acute distress.  Cardiovascular: normal rate Respiratory: normal effort GI: Abd soft, non-tender. Pos BS x 4 MS: Extremities nontender, no edema, normal ROM Neurologic: Alert and oriented x 4.  GU: Neg CVAT.  PELVIC EXAM: Cervix pink, visually closed, without lesion, moderate amount frothy white discharge, no blood noted, vaginal walls and external genitalia normal  Cervix closed/long/high, posterior, firm  FHT 140 by doppler  LAB RESULTS Results for orders placed or performed during the hospital encounter of 05/10/15 (from the past 24 hour(s))  Urinalysis, Routine w reflex microscopic (not at [facility])     Status: Abnormal   Collection Time: 05/10/15  2:25 PM  Result Value Ref Range  Color, Urine AMBER (A) YELLOW   APPearance CLEAR CLEAR   Specific Gravity, Urine >1.030 (H) 1.005 - 1.030   pH 5.0 5.0 - 8.0   Glucose, UA NEGATIVE NEGATIVE mg/dL   Hgb urine dipstick TRACE (A) NEGATIVE   Bilirubin Urine NEGATIVE NEGATIVE   Ketones, ur NEGATIVE NEGATIVE mg/dL   Protein, ur NEGATIVE NEGATIVE mg/dL   Nitrite NEGATIVE NEGATIVE   Leukocytes, UA NEGATIVE NEGATIVE  Urine microscopic-add on      Status: Abnormal   Collection Time: 05/10/15  2:25 PM  Result Value Ref Range   Squamous Epithelial / LPF 0-5 (A) NONE SEEN   WBC, UA 0-5 0 - 5 WBC/hpf   RBC / HPF 0-5 0 - 5 RBC/hpf   Bacteria, UA MANY (A) NONE SEEN   Crystals CA OXALATE CRYSTALS (A) NEGATIVE   Urine-Other MUCOUS PRESENT   Wet prep, genital     Status: Abnormal   Collection Time: 05/10/15  3:20 PM  Result Value Ref Range   Yeast Wet Prep HPF POC PRESENT (A) NONE SEEN   Trich, Wet Prep NONE SEEN NONE SEEN   Clue Cells Wet Prep HPF POC PRESENT (A) NONE SEEN   WBC, Wet Prep HPF POC MODERATE (A) NONE SEEN   Sperm NONE SEEN        IMAGING No results found.  MAU Management/MDM: Ordered labs and reviewed results.  No evidence of vaginal bleeding on exam.  Bleeding may have been rectal bleeding with bowel movement or spotting from bacterial vaginosis. Will treat bacterial vaginosis with Flagyl 500 mg BID x 7 days.  May take OTC imodium PRN diarrhea.  Bleeding precautions given.  Pt stable at time of discharge.  ASSESSMENT 1. BV (bacterial vaginosis)   2. Spotting affecting pregnancy in second trimester, antepartum     PLAN Discharge home    Medication List    STOP taking these medications        cyclobenzaprine 10 MG tablet  Commonly known as:  FLEXERIL     ranitidine 150 MG tablet  Commonly known as:  ZANTAC      TAKE these medications        ferrous sulfate 325 (65 FE) MG tablet  Take 325 mg by mouth daily with breakfast.     metroNIDAZOLE 500 MG tablet  Commonly known as:  FLAGYL  Take 1 tablet (500 mg total) by mouth 2 (two) times daily.     prenatal multivitamin Tabs tablet  Take 1 tablet by mouth daily at 12 noon.       Follow-up Information    Follow up with Kathreen CosierMARSHALL,BERNARD A, MD.   Specialty:  Obstetrics and Gynecology   Why:  As scheduled, Return to MAU as needed for emergencies   Contact information:   [address] [place] [id number] [phone]       Sharen CounterLisa  [place] Certified Nurse-Midwife 05/10/2015  4:25 PM

## 2015-05-10 NOTE — MAU Note (Signed)
Pt started having diarrhea yesterday, has had 3 stools today.  No vomiting.

## 2015-05-10 NOTE — MAU Note (Signed)
Was at work, when she went to the bathroom, she saw some blood.  Only saw the one time yesterday.

## 2015-05-10 NOTE — Discharge Instructions (Signed)
Bacterial Vaginosis Bacterial vaginosis is a vaginal infection that occurs when the normal balance of bacteria in the vagina is disrupted. It results from an overgrowth of certain bacteria. This is the most common vaginal infection in women of childbearing age. Treatment is important to prevent complications, especially in pregnant women, as it can cause a premature delivery. CAUSES  Bacterial vaginosis is caused by an increase in harmful bacteria that are normally present in smaller amounts in the vagina. Several different kinds of bacteria can cause bacterial vaginosis. However, the reason that the condition develops is not fully understood. RISK FACTORS Certain activities or behaviors can put you at an increased risk of developing bacterial vaginosis, including:  Having a new sex partner or multiple sex partners.  Douching.  Using an intrauterine device (IUD) for contraception. Women do not get bacterial vaginosis from toilet seats, bedding, swimming pools, or contact with objects around them. SIGNS AND SYMPTOMS  Some women with bacterial vaginosis have no signs or symptoms. Common symptoms include:  Grey vaginal discharge.  A fishlike odor with discharge, especially after sexual intercourse.  Itching or burning of the vagina and vulva.  Burning or pain with urination. DIAGNOSIS  Your health care provider will take a medical history and examine the vagina for signs of bacterial vaginosis. A sample of vaginal fluid may be taken. Your health care provider will look at this sample under a microscope to check for bacteria and abnormal cells. A vaginal pH test may also be done.  TREATMENT  Bacterial vaginosis may be treated with antibiotic medicines. These may be given in the form of a pill or a vaginal cream. A second round of antibiotics may be prescribed if the condition comes back after treatment. Because bacterial vaginosis increases your risk for sexually transmitted diseases, getting  treated can help reduce your risk for chlamydia, gonorrhea, HIV, and herpes. HOME CARE INSTRUCTIONS   Only take over-the-counter or prescription medicines as directed by your health care provider.  If antibiotic medicine was prescribed, take it as directed. Make sure you finish it even if you start to feel better.  Tell all sexual partners that you have a vaginal infection. They should see their health care provider and be treated if they have problems, such as a mild rash or itching.  During treatment, it is important that you follow these instructions:  Avoid sexual activity or use condoms correctly.  Do not douche.  Avoid alcohol as directed by your health care provider.  Avoid breastfeeding as directed by your health care provider. SEEK MEDICAL CARE IF:   Your symptoms are not improving after 3 days of treatment.  You have increased discharge or pain.  You have a fever. MAKE SURE YOU:   Understand these instructions.  Will watch your condition.  Will get help right away if you are not doing well or get worse. FOR MORE INFORMATION  Centers for Disease Control and Prevention, Division of STD Prevention: SolutionApps.co.zawww.cdc.gov/std American Sexual Health Association (ASHA): www.ashastd.org    This information is not intended to replace advice given to you by your health care provider. Make sure you discuss any questions you have with your health care provider.   Document Released: 01/16/2005 Document Revised: 02/06/2014 Document Reviewed: 08/28/2012 Elsevier Interactive Patient Education 2016 Elsevier Inc. Vaginal Bleeding During Pregnancy, Second Trimester A small amount of bleeding (spotting) from the vagina is relatively common in pregnancy. It usually stops on its own. Various things can cause bleeding or spotting in pregnancy. Some bleeding  may be related to the pregnancy, and some may not. Sometimes the bleeding is normal and is not a problem. However, bleeding can also be a sign  of something serious. Be sure to tell your health care provider about any vaginal bleeding right away. Some possible causes of vaginal bleeding during the second trimester include:  Infection, inflammation, or growths on the cervix.   The placenta may be partially or completely covering the opening of the cervix inside the uterus (placenta previa).  The placenta may have separated from the uterus (abruption of the placenta).   You may be having early (preterm) labor.   The cervix may not be strong enough to keep a baby inside the uterus (cervical insufficiency).   Tiny cysts may have developed in the uterus instead of pregnancy tissue (molar pregnancy). HOME CARE INSTRUCTIONS  Watch your condition for any changes. The following actions may help to lessen any discomfort you are feeling:  Follow your health care provider's instructions for limiting your activity. If your health care provider orders bed rest, you may need to stay in bed and only get up to use the bathroom. However, your health care provider may allow you to continue light activity.  If needed, make plans for someone to help with your regular activities and responsibilities while you are on bed rest.  Keep track of the number of pads you use each day, how often you change pads, and how soaked (saturated) they are. Write this down.  Do not use tampons. Do not douche.  Do not have sexual intercourse or orgasms until approved by your health care provider.  If you pass any tissue from your vagina, save the tissue so you can show it to your health care provider.  Only take over-the-counter or prescription medicines as directed by your health care provider.  Do not take aspirin because it can make you bleed.  Do not exercise or perform any strenuous activities or heavy lifting without your health care provider's permission.  Keep all follow-up appointments as directed by your health care provider. SEEK MEDICAL CARE  IF:  You have any vaginal bleeding during any part of your pregnancy.  You have cramps or labor pains.  You have a fever, not controlled by medicine. SEEK IMMEDIATE MEDICAL CARE IF:   You have severe cramps in your back or belly (abdomen).  You have contractions.  You have chills.  You pass large clots or tissue from your vagina.  Your bleeding increases.  You feel light-headed or weak, or you have fainting episodes.  You are leaking fluid or have a gush of fluid from your vagina. MAKE SURE YOU:  Understand these instructions.  Will watch your condition.  Will get help right away if you are not doing well or get worse.   This information is not intended to replace advice given to you by your health care provider. Make sure you discuss any questions you have with your health care provider.   Document Released: 10/26/2004 Document Revised: 01/21/2013 Document Reviewed: 09/23/2012 Elsevier Interactive Patient Education [employer]2016 Elsevier Inc.

## 2015-05-11 LAB — GC/CHLAMYDIA PROBE AMP (~~LOC~~) NOT AT ARMC
Chlamydia: NEGATIVE
Neisseria Gonorrhea: NEGATIVE

## 2015-06-10 ENCOUNTER — Emergency Department (HOSPITAL_COMMUNITY)
Admission: EM | Admit: 2015-06-10 | Discharge: 2015-06-10 | Disposition: A | Discharge status: Home / Self Care | Payer: Medicaid Other | Attending: Emergency Medicine | Admitting: Emergency Medicine

## 2015-06-10 ENCOUNTER — Emergency Department (HOSPITAL_COMMUNITY): Payer: Medicaid Other

## 2015-06-10 ENCOUNTER — Encounter (HOSPITAL_COMMUNITY): Payer: Self-pay | Admitting: Emergency Medicine

## 2015-06-10 DIAGNOSIS — Y999 Unspecified external cause status: Secondary | ICD-10-CM | POA: Insufficient documentation

## 2015-06-10 DIAGNOSIS — Z79899 Other long term (current) drug therapy: Secondary | ICD-10-CM | POA: Diagnosis not present

## 2015-06-10 DIAGNOSIS — Y939 Activity, unspecified: Secondary | ICD-10-CM | POA: Diagnosis not present

## 2015-06-10 DIAGNOSIS — Z3A24 24 weeks gestation of pregnancy: Secondary | ICD-10-CM | POA: Insufficient documentation

## 2015-06-10 DIAGNOSIS — F17210 Nicotine dependence, cigarettes, uncomplicated: Secondary | ICD-10-CM | POA: Insufficient documentation

## 2015-06-10 DIAGNOSIS — O9A212 Injury, poisoning and certain other consequences of external causes complicating pregnancy, second trimester: Secondary | ICD-10-CM | POA: Insufficient documentation

## 2015-06-10 DIAGNOSIS — T71193A Asphyxiation due to mechanical threat to breathing due to other causes, assault, initial encounter: Secondary | ICD-10-CM | POA: Diagnosis not present

## 2015-06-10 DIAGNOSIS — Y929 Unspecified place or not applicable: Secondary | ICD-10-CM | POA: Diagnosis not present

## 2015-06-10 DIAGNOSIS — S0083XA Contusion of other part of head, initial encounter: Secondary | ICD-10-CM

## 2015-06-10 DIAGNOSIS — Z3490 Encounter for supervision of normal pregnancy, unspecified, unspecified trimester: Secondary | ICD-10-CM

## 2015-06-10 DIAGNOSIS — IMO0001 Reserved for inherently not codable concepts without codable children: Secondary | ICD-10-CM

## 2015-06-10 DIAGNOSIS — S0993XA Unspecified injury of face, initial encounter: Secondary | ICD-10-CM | POA: Diagnosis present

## 2015-06-10 MED ORDER — IOPAMIDOL (ISOVUE-300) INJECTION 61%
75.0000 mL | Freq: Once | INTRAVENOUS | Status: AC | PRN
  Administered 2015-06-10: 75 mL via INTRAVENOUS
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
## 2015-06-10 NOTE — ED Notes (Signed)
Patient transported to CT 

## 2015-06-10 NOTE — Discharge Instructions (Signed)
Facial or Scalp Contusion °A facial or scalp contusion is a deep bruise on the face or head. Injuries to the face and head generally cause a lot of swelling, especially around the eyes. Contusions are the result of an injury that caused bleeding under the skin. The contusion may turn blue, purple, or yellow. Minor injuries will give you a painless contusion, but more severe contusions may stay painful and swollen for a few weeks.  °CAUSES  °A facial or scalp contusion is caused by a blunt injury or trauma to the face or head area.  °SIGNS AND SYMPTOMS  °· Swelling of the injured area.   °· Discoloration of the injured area.   °· Tenderness, soreness, or pain in the injured area.   °DIAGNOSIS  °The diagnosis can be made by taking a medical history and doing a physical exam. An X-ray exam, CT scan, or MRI may be needed to determine if there are any associated injuries, such as broken bones (fractures). °TREATMENT  °Often, the best treatment for a facial or scalp contusion is applying cold compresses to the injured area. Over-the-counter medicines may also be recommended for pain control.  °HOME CARE INSTRUCTIONS  °· Only take over-the-counter or prescription medicines as directed by your health care provider.   °· Apply ice to the injured area.   °¨ Put ice in a plastic bag.   °¨ Place a towel between your skin and the bag.   °¨ Leave the ice on for 20 minutes, 2-3 times a day.   °SEEK MEDICAL CARE IF: °· You have bite problems.   °· You have pain with chewing.   °· You are concerned about facial defects. °SEEK IMMEDIATE MEDICAL CARE IF: °· You have severe pain or a headache that is not relieved by medicine.   °· You have unusual sleepiness, confusion, or personality changes.   °· You throw up (vomit).   °· You have a persistent nosebleed.   °· You have double vision or blurred vision.   °· You have fluid drainage from your nose or ear.   °· You have difficulty walking or using your arms or legs.   °MAKE SURE YOU:   °· Understand these instructions. °· Will watch your condition. °· Will get help right away if you are not doing well or get worse. °  °This information is not intended to replace advice given to you by your health care provider. Make sure you discuss any questions you have with your health care provider. °  °Document Released: 02/24/2004 Document Revised: 02/06/2014 Document Reviewed: 08/29/2012 °Elsevier Interactive Patient Education ©2016 Elsevier Inc. ° °General Assault °Assault includes any behavior or physical attack--whether it is on purpose or not--that results in injury to another person, damage to property, or both. This also includes assault that has not yet happened, but is planned to happen. Threats of assault may be physical, verbal, or written. They may be said or sent by: °· Mail. °· E-mail. °· Text. °· Social media. °· Fax. °The threats may be direct, implied, or understood. °WHAT ARE THE DIFFERENT FORMS OF ASSAULT? °Forms of assault include: °· Physically assaulting a person. This includes physical threats to inflict physical harm as well as: °¨ Slapping. °¨ Hitting. °¨ Poking. °¨ Kicking. °¨ Punching. °¨ Pushing. °· Sexually assaulting a person. Sexual assault is any sexual activity that a person is forced, threatened, or coerced to participate in. It may or may not involve physical contact with the person who is assaulting you. You are sexually assaulted if you are forced to have sexual contact of any kind. °·   Damaging or destroying a person's assistive equipment, such as glasses, canes, or walkers. °· Throwing or hitting objects. °· Using or displaying a weapon to harm or threaten someone. °· Using or displaying an object that appears to be a weapon in a threatening manner. °· Using greater physical size or strength to intimidate someone. °· Making intimidating or threatening gestures. °· Bullying. °· Hazing. °· Using language that is intimidating, threatening, hostile, or  abusive. °· Stalking. °· Restraining someone with force. °WHAT SHOULD I DO IF I EXPERIENCE ASSAULT? °· Report assaults, threats, and stalking to the police. Call your local emergency services (911 in the [place] if you are in immediate danger or you need medical help.  °· You can work with a lawyer or an advocate to get legal protection against someone who has assaulted you or threatened you with assault. Protection includes restraining orders and private addresses. Crimes against you, such as assault, can also be prosecuted through the courts. Laws will vary depending on where you live. °  °This information is not intended to replace advice given to you by your health care provider. Make sure you discuss any questions you have with your health care provider. °  °Document Released: 01/16/2005 Document Revised: 02/06/2014 Document Reviewed: 10/03/2013 °Elsevier Interactive Patient Education ©2016 Elsevier Inc. ° °

## 2015-06-10 NOTE — ED Notes (Signed)
Minimal bruising noted to left cheek.
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
## 2015-08-12 ENCOUNTER — Encounter: Payer: Medicaid Other | Admitting: Obstetrics & Gynecology

## 2015-08-19 ENCOUNTER — Encounter: Payer: Self-pay | Admitting: *Deleted

## 2015-08-19 ENCOUNTER — Encounter: Payer: Medicaid Other | Admitting: Obstetrics and Gynecology

## 2015-08-26 ENCOUNTER — Ambulatory Visit (INDEPENDENT_AMBULATORY_CARE_PROVIDER_SITE_OTHER): Payer: Medicaid Other | Admitting: Obstetrics and Gynecology

## 2015-08-26 ENCOUNTER — Encounter: Payer: Self-pay | Admitting: Obstetrics and Gynecology

## 2015-08-26 VITALS — BP 121/81 | HR 90 | Temp 97.8°F | Wt 235.0 lb

## 2015-08-26 DIAGNOSIS — Z331 Pregnant state, incidental: Secondary | ICD-10-CM

## 2015-08-26 DIAGNOSIS — Z1389 Encounter for screening for other disorder: Secondary | ICD-10-CM | POA: Diagnosis not present

## 2015-08-26 DIAGNOSIS — O99213 Obesity complicating pregnancy, third trimester: Secondary | ICD-10-CM | POA: Diagnosis not present

## 2015-08-26 DIAGNOSIS — Z3493 Encounter for supervision of normal pregnancy, unspecified, third trimester: Secondary | ICD-10-CM

## 2015-08-26 DIAGNOSIS — Z3483 Encounter for supervision of other normal pregnancy, third trimester: Secondary | ICD-10-CM

## 2015-08-26 DIAGNOSIS — Z6841 Body Mass Index (BMI) 40.0 and over, adult: Secondary | ICD-10-CM | POA: Insufficient documentation

## 2015-08-26 DIAGNOSIS — Z98891 History of uterine scar from previous surgery: Secondary | ICD-10-CM | POA: Insufficient documentation

## 2015-08-26 LAB — POCT URINALYSIS DIPSTICK
BILIRUBIN UA: NEGATIVE
GLUCOSE UA: NEGATIVE
KETONES UA: NEGATIVE
Nitrite, UA: NEGATIVE
PH UA: 5
RBC UA: NEGATIVE
SPEC GRAV UA: 1.015
Urobilinogen, UA: NEGATIVE

## 2015-08-26 NOTE — Progress Notes (Signed)
Patient states that she has back pain and pelvic pain and pressure

## 2015-08-26 NOTE — Progress Notes (Signed)
Prenatal Visit Note Date: 08/26/2015 Clinic: [facility]  Subjective:  Suzanne Bush is a 30 y.o. [id number] at [redacted]w[redacted]d being seen today for ongoing prenatal care.  She is currently monitored for the following issues for this low risk pregnancy and has Supervision of normal pregnancy in third trimester; Obesity affecting pregnancy in third trimester; BMI 40.0-44.9, adult (HCC); and History of cesarean delivery on her problem list.  Patient reports no complaints.   Contractions: Not present. Vag. Bleeding: None.  Movement: Present. Denies leaking of fluid.   The following portions of the patient's history were reviewed and updated as appropriate: allergies, current medications, past family history, past medical history, past social history, past surgical history and problem list. Problem list updated.  Objective:   Vitals:   08/26/15 1109  BP: 121/81  Pulse: 90  Temp: 97.8 F (36.6 C)  Weight: 235 lb (106.6 kg)    Fetal Status:     Movement: Present     General:  Alert, oriented and cooperative. Patient is in no acute distress.  Skin: Skin is warm and dry. No rash noted.   Cardiovascular: Normal heart rate noted  Respiratory: Normal respiratory effort, no problems with respiration noted  Abdomen: Soft, gravid, appropriate for gestational age. Pain/Pressure: Present     Pelvic:  Cervical exam deferred        Extremities: Normal range of motion.  Edema: Moderate pitting, indentation subsides rapidly  Mental Status: Normal mood and affect. Normal behavior. Normal judgment and thought content.   Urinalysis: Urine Protein: Trace Urine Glucose: Negative  Assessment and Plan:  Pregnancy: G4P1021 at [redacted]w[redacted]d  1. Supervision of normal pregnancy in third trimester Chart reviewed. No hiv or rpr seen with 1hr and cbc so will get those today. gbs nv. Undecided on BC - HIV antibody (with reflex) - RPR  2. Obesity affecting pregnancy in third trimester As above  3. BMI 40.0-44.9, adult (HCC) As  above  4. History of cesarean delivery 05/2009 pLTCS with single layer chromic closure for NRFHT. Patient states she was about 8-9cm and she was 7lbs 4oz. She desires VBAC. R/b d/w her and VBAC form given for her to review and can d/w her at nv and sign form then if she still desires TOLAC   Preterm labor symptoms and general obstetric precautions including but not limited to vaginal bleeding, contractions, leaking of fluid and fetal movement were reviewed in detail with the patient. Please refer to After Visit Summary for other counseling recommendations.  Return in about 10 days (around 09/05/2015).   Calzada Bing, MD

## 2015-08-27 LAB — HIV ANTIBODY (ROUTINE TESTING W REFLEX): HIV Screen 4th Generation wRfx: NONREACTIVE

## 2015-08-27 LAB — RPR: RPR Ser Ql: NONREACTIVE

## 2015-09-09 ENCOUNTER — Encounter: Payer: Self-pay | Admitting: Obstetrics & Gynecology

## 2015-09-09 ENCOUNTER — Ambulatory Visit (INDEPENDENT_AMBULATORY_CARE_PROVIDER_SITE_OTHER): Payer: Medicaid Other | Admitting: Obstetrics & Gynecology

## 2015-09-09 VITALS — BP 140/90 | HR 100 | Wt 236.0 lb

## 2015-09-09 DIAGNOSIS — Z3493 Encounter for supervision of normal pregnancy, unspecified, third trimester: Secondary | ICD-10-CM

## 2015-09-09 NOTE — Patient Instructions (Signed)

## 2015-09-09 NOTE — Progress Notes (Signed)
Subjective:  Suzanne Bush is a 30 y.o. [phone] at 89w6dbeing seen today for ongoing prenatal care.  She is currently monitored for the following issues for this low-risk pregnancy and has Supervision of normal pregnancy in third trimester; Obesity affecting pregnancy in third trimester; BMI 40.0-44.9, adult ([place]; and History of cesarean delivery on her problem list.  Patient reports no complaints.  Contractions: Irritability. Vag. Bleeding: None.  Movement: Present. Denies leaking of fluid.   The following portions of the patient's history were reviewed and updated as appropriate: allergies, current medications, past family history, past medical history, past social history, past surgical history and problem list. Problem list updated.  Objective:   Vitals:   09/09/15 0941 09/09/15 1011  BP: 140/80 140/90  Pulse: 88 100  Weight: 236 lb (107 kg)     Fetal Status: Fetal Heart Rate (bpm): 145   Movement: Present     General:  Alert, oriented and cooperative. Patient is in no acute distress.  Skin: Skin is warm and dry. No rash noted.   Cardiovascular: Normal heart rate noted  Respiratory: Normal respiratory effort, no problems with respiration noted  Abdomen: Soft, gravid, appropriate for gestational age. Pain/Pressure: Present     Pelvic:  Cervical exam performed        Extremities: Normal range of motion.  Edema: Mild pitting, slight indentation  Mental Status: Normal mood and affect. Normal behavior. Normal judgment and thought content.   Urinalysis: Urine Protein: Trace Urine Glucose: Negative  Assessment and Plan:  Pregnancy: G4P1021 at 32w6d1. Prenatal care in third trimester Elevated BP noted - Culture, beta strep (group b only) - GC/Chlamydia Probe Amp - CBC with Differential - Comp Met (CMET) - Protein / creatinine ratio, urine  Term labor symptoms and general obstetric precautions including but not limited to vaginal bleeding, contractions, leaking of fluid and  fetal movement were reviewed in detail with the patient. Please refer to After Visit Summary for other counseling recommendations.  Return in about 4 days (around 09/13/2015).   JaWoodroe ModeMD

## 2015-09-09 NOTE — Progress Notes (Signed)
Repeat bp:140/90 Dr. Debroah LoopArnold aware. Armandina StammerJennifer Howard RN BSN

## 2015-09-10 LAB — CBC WITH DIFFERENTIAL/PLATELET
BASOS: 0 %
Basophils Absolute: 0 10*3/uL (ref 0.0–0.2)
EOS (ABSOLUTE): 0.1 10*3/uL (ref 0.0–0.4)
EOS: 1 %
HEMATOCRIT: 34.4 % (ref 34.0–46.6)
HEMOGLOBIN: 11.3 g/dL (ref 11.1–15.9)
IMMATURE GRANS (ABS): 0.1 10*3/uL (ref 0.0–0.1)
IMMATURE GRANULOCYTES: 1 %
LYMPHS: 24 %
Lymphocytes Absolute: 3.6 10*3/uL — ABNORMAL HIGH (ref 0.7–3.1)
MCH: 27.2 pg (ref 26.6–33.0)
MCHC: 32.8 g/dL (ref 31.5–35.7)
MCV: 83 fL (ref 79–97)
MONOCYTES: 11 %
Monocytes Absolute: 1.6 10*3/uL — ABNORMAL HIGH (ref 0.1–0.9)
NEUTROS ABS: 9.4 10*3/uL — AB (ref 1.4–7.0)
NEUTROS PCT: 63 %
Platelets: 372 10*3/uL (ref 150–379)
RBC: 4.15 x10E6/uL (ref 3.77–5.28)
RDW: 14.1 % (ref 12.3–15.4)
WBC: 14.9 10*3/uL — ABNORMAL HIGH (ref 3.4–10.8)

## 2015-09-10 LAB — COMPREHENSIVE METABOLIC PANEL
A/G RATIO: 1 — AB (ref 1.2–2.2)
ALT: 9 IU/L (ref 0–32)
AST: 13 IU/L (ref 0–40)
Albumin: 3.2 g/dL — ABNORMAL LOW (ref 3.5–5.5)
Alkaline Phosphatase: 186 IU/L — ABNORMAL HIGH (ref 39–117)
BILIRUBIN TOTAL: 0.3 mg/dL (ref 0.0–1.2)
BUN / CREAT RATIO: 15 (ref 9–23)
BUN: 12 mg/dL (ref 6–20)
CALCIUM: 9.4 mg/dL (ref 8.7–10.2)
CHLORIDE: 103 mmol/L (ref 96–106)
CO2: 22 mmol/L (ref 18–29)
Creatinine, Ser: 0.8 mg/dL (ref 0.57–1.00)
GFR, EST AFRICAN AMERICAN: 115 mL/min/{1.73_m2} (ref >59)
GFR, EST NON AFRICAN AMERICAN: 100 mL/min/{1.73_m2} (ref >59)
Globulin, Total: 3.3 g/dL (ref 1.5–4.5)
Glucose: 69 mg/dL (ref 65–99)
POTASSIUM: 4.7 mmol/L (ref 3.5–5.2)
Sodium: 140 mmol/L (ref 134–144)
TOTAL PROTEIN: 6.5 g/dL (ref 6.0–8.5)

## 2015-09-10 LAB — PROTEIN / CREATININE RATIO, URINE
Creatinine, Urine: 217.1 mg/dL
PROTEIN/CREAT RATIO: 118 mg/g{creat} (ref 0–200)
Protein, Ur: 25.7 mg/dL

## 2015-09-11 LAB — GC/CHLAMYDIA PROBE AMP
CHLAMYDIA, DNA PROBE: NEGATIVE
Neisseria gonorrhoeae by PCR: NEGATIVE

## 2015-09-12 LAB — CULTURE, BETA STREP (GROUP B ONLY): Strep Gp B Culture: POSITIVE — AB

## 2015-09-13 ENCOUNTER — Ambulatory Visit (INDEPENDENT_AMBULATORY_CARE_PROVIDER_SITE_OTHER): Payer: Medicaid Other | Admitting: Obstetrics & Gynecology

## 2015-09-13 VITALS — BP 143/97 | HR 86 | Temp 98.3°F | Wt 240.9 lb

## 2015-09-13 DIAGNOSIS — Z331 Pregnant state, incidental: Secondary | ICD-10-CM

## 2015-09-13 DIAGNOSIS — O133 Gestational [pregnancy-induced] hypertension without significant proteinuria, third trimester: Secondary | ICD-10-CM | POA: Diagnosis not present

## 2015-09-13 DIAGNOSIS — Z1389 Encounter for screening for other disorder: Secondary | ICD-10-CM

## 2015-09-13 DIAGNOSIS — Z6841 Body Mass Index (BMI) 40.0 and over, adult: Secondary | ICD-10-CM

## 2015-09-13 DIAGNOSIS — O99213 Obesity complicating pregnancy, third trimester: Secondary | ICD-10-CM

## 2015-09-13 DIAGNOSIS — Z3493 Encounter for supervision of normal pregnancy, unspecified, third trimester: Secondary | ICD-10-CM | POA: Diagnosis not present

## 2015-09-13 DIAGNOSIS — Z98891 History of uterine scar from previous surgery: Secondary | ICD-10-CM

## 2015-09-13 LAB — POCT URINALYSIS DIPSTICK
Bilirubin, UA: NEGATIVE
Blood, UA: NEGATIVE
Glucose, UA: NEGATIVE
Ketones, UA: NEGATIVE
LEUKOCYTES UA: NEGATIVE
NITRITE UA: NEGATIVE
PH UA: 6
PROTEIN UA: NEGATIVE
Spec Grav, UA: 1.015
Urobilinogen, UA: 0.2

## 2015-09-13 NOTE — Progress Notes (Signed)
Pt denies headaches or vision changes. She c/o pain and pressure in pelvic area.

## 2015-09-13 NOTE — Patient Instructions (Signed)
Levonorgestrel intrauterine device (IUD) What is this medicine? LEVONORGESTREL IUD (LEE voe nor jes trel) is a contraceptive (birth control) device. The device is placed inside the uterus by a healthcare professional. It is used to prevent pregnancy and can also be used to treat heavy bleeding that occurs during your period. Depending on the device, it can be used for 3 to 5 years. This medicine may be used for other purposes; ask your health care provider or pharmacist if you have questions. What should I tell my health care provider before I take this medicine? They need to know if you have any of these conditions: -abnormal Pap smear -cancer of the breast, uterus, or cervix -diabetes -endometritis -genital or pelvic infection now or in the past -have more than one sexual partner or your partner has more than one partner -heart disease -history of an ectopic or tubal pregnancy -immune system problems -IUD in place -liver disease or tumor -problems with blood clots or take blood-thinners -use intravenous drugs -uterus of unusual shape -vaginal bleeding that has not been explained -an unusual or allergic reaction to levonorgestrel, other hormones, silicone, or polyethylene, medicines, foods, dyes, or preservatives -pregnant or trying to get pregnant -breast-feeding How should I use this medicine? This device is placed inside the uterus by a health care professional. Talk to your pediatrician regarding the use of this medicine in children. Special care may be needed. Overdosage: If you think you have taken too much of this medicine contact a poison control center or emergency room at once. NOTE: This medicine is only for you. Do not share this medicine with others. What if I miss a dose? This does not apply. What may interact with this medicine? Do not take this medicine with any of the following medications: -amprenavir -bosentan -fosamprenavir This medicine may also interact with  the following medications: -aprepitant -barbiturate medicines for inducing sleep or treating seizures -bexarotene -griseofulvin -medicines to treat seizures like carbamazepine, ethotoin, felbamate, oxcarbazepine, phenytoin, topiramate -modafinil -pioglitazone -rifabutin -rifampin -rifapentine -some medicines to treat HIV infection like atazanavir, indinavir, lopinavir, nelfinavir, tipranavir, ritonavir -St. John's wort -warfarin This list may not describe all possible interactions. Give your health care provider a list of all the medicines, herbs, non-prescription drugs, or dietary supplements you use. Also tell them if you smoke, drink alcohol, or use illegal drugs. Some items may interact with your medicine. What should I watch for while using this medicine? Visit your doctor or health care professional for regular check ups. See your doctor if you or your partner has sexual contact with others, becomes HIV positive, or gets a sexual transmitted disease. This product does not protect you against HIV infection (AIDS) or other sexually transmitted diseases. You can check the placement of the IUD yourself by reaching up to the top of your vagina with clean fingers to feel the threads. Do not pull on the threads. It is a good habit to check placement after each menstrual period. Call your doctor right away if you feel more of the IUD than just the threads or if you cannot feel the threads at all. The IUD may come out by itself. You may become pregnant if the device comes out. If you notice that the IUD has come out use a backup birth control method like condoms and call your health care provider. Using tampons will not change the position of the IUD and are okay to use during your period. What side effects may I notice from receiving this medicine?   Side effects that you should report to your doctor or health care professional as soon as possible: -allergic reactions like skin rash, itching or  hives, swelling of the face, lips, or tongue -fever, flu-like symptoms -genital sores -high blood pressure -no menstrual period for 6 weeks during use -pain, swelling, warmth in the leg -pelvic pain or tenderness -severe or sudden headache -signs of pregnancy -stomach cramping -sudden shortness of breath -trouble with balance, talking, or walking -unusual vaginal bleeding, discharge -yellowing of the eyes or skin Side effects that usually do not require medical attention (report to your doctor or health care professional if they continue or are bothersome): -acne -breast pain -change in sex drive or performance -changes in weight -cramping, dizziness, or faintness while the device is being inserted -headache -irregular menstrual bleeding within first 3 to 6 months of use -nausea This list may not describe all possible side effects. Call your doctor for medical advice about side effects. You may report side effects to FDA at 1-800-FDA-1088. Where should I keep my medicine? This does not apply. NOTE: This sheet is a summary. It may not cover all possible information. If you have questions about this medicine, talk to your doctor, pharmacist, or health care provider.    2016, Elsevier/Gold Standard. (2011-02-16 13:54:04)  

## 2015-09-13 NOTE — Progress Notes (Signed)
Subjective:  Suzanne Bush is a 30 y.o. [phone] at 7275w3d being seen today for ongoing prenatal care.  She is currently monitored for the following issues for this low-risk pregnancy and has Supervision of normal pregnancy in third trimester; Obesity affecting pregnancy in third trimester; BMI 40.0-44.9, adult (HCC); and History of cesarean delivery on her problem list.  Patient reports no complaints.  Contractions: Not present. Vag. Bleeding: None.  Movement: Present. Denies leaking of fluid.   The following portions of the patient's history were reviewed and updated as appropriate: allergies, current medications, past family history, past medical history, past social history, past surgical history and problem list. Problem list updated.  Objective:   Vitals:   09/13/15 1342  BP: (!) 141/101  Pulse: 86  Temp: 98.3 F (36.8 C)  Weight: 240 lb 14.4 oz (109.3 kg)    Fetal Status:     Movement: Present     General:  Alert, oriented and cooperative. Patient is in no acute distress.  Skin: Skin is warm and dry. No rash noted.   Cardiovascular: Normal heart rate noted  Respiratory: Normal respiratory effort, no problems with respiration noted  Abdomen: Soft, gravid, appropriate for gestational age. Pain/Pressure: Present     Pelvic:  Cervical exam deferred        Extremities: Normal range of motion.  Edema: Mild pitting, slight indentation  Mental Status: Normal mood and affect. Normal behavior. Normal judgment and thought content.   Urinalysis:      Assessment and Plan:  Pregnancy: G4P1021 at 7775w3d  1. Prenatal care, third trimester  2. Supervision of normal pregnancy in third trimester  3. Obesity affecting pregnancy in third trimester  4. History of cesarean delivery VBAC consent prev signed  5. BMI 40.0-44.9, adult (HCC)  6. Gestational HTN- normal labs. Neg proteinuria PIH precautions given   Term labor symptoms and general obstetric precautions including but not  limited to vaginal bleeding, contractions, leaking of fluid and fetal movement were reviewed in detail with the patient. Please refer to After Visit Summary for other counseling recommendations.  Return in about 1 week (around 09/20/2015).   Willodean Rosenthalarolyn Harraway-Smith, MD

## 2015-09-20 ENCOUNTER — Inpatient Hospital Stay (HOSPITAL_COMMUNITY)
Admission: AD | Admit: 2015-09-20 | Discharge: 2015-09-23 | DRG: 765 | Disposition: A | Discharge status: Home / Self Care | Payer: Medicaid Other | Source: Ambulatory Visit | Attending: Family Medicine | Admitting: Family Medicine

## 2015-09-20 ENCOUNTER — Ambulatory Visit (INDEPENDENT_AMBULATORY_CARE_PROVIDER_SITE_OTHER): Payer: Medicaid Other | Admitting: Obstetrics & Gynecology

## 2015-09-20 ENCOUNTER — Inpatient Hospital Stay (HOSPITAL_COMMUNITY): Payer: Medicaid Other | Admitting: Anesthesiology

## 2015-09-20 ENCOUNTER — Encounter (HOSPITAL_COMMUNITY)
Admission: AD | Disposition: A | Discharge status: Home / Self Care | Payer: Self-pay | Source: Ambulatory Visit | Attending: Family Medicine

## 2015-09-20 ENCOUNTER — Encounter (HOSPITAL_COMMUNITY): Payer: Self-pay

## 2015-09-20 VITALS — BP 147/99 | HR 102 | Wt 243.0 lb

## 2015-09-20 DIAGNOSIS — O1414 Severe pre-eclampsia complicating childbirth: Principal | ICD-10-CM | POA: Diagnosis present

## 2015-09-20 DIAGNOSIS — Z3483 Encounter for supervision of other normal pregnancy, third trimester: Secondary | ICD-10-CM

## 2015-09-20 DIAGNOSIS — R030 Elevated blood-pressure reading, without diagnosis of hypertension: Secondary | ICD-10-CM | POA: Diagnosis present

## 2015-09-20 DIAGNOSIS — O34211 Maternal care for low transverse scar from previous cesarean delivery: Secondary | ICD-10-CM | POA: Diagnosis present

## 2015-09-20 DIAGNOSIS — F17210 Nicotine dependence, cigarettes, uncomplicated: Secondary | ICD-10-CM | POA: Diagnosis present

## 2015-09-20 DIAGNOSIS — O1490 Unspecified pre-eclampsia, unspecified trimester: Secondary | ICD-10-CM | POA: Diagnosis present

## 2015-09-20 DIAGNOSIS — O99214 Obesity complicating childbirth: Secondary | ICD-10-CM | POA: Diagnosis present

## 2015-09-20 DIAGNOSIS — O99334 Smoking (tobacco) complicating childbirth: Secondary | ICD-10-CM | POA: Diagnosis present

## 2015-09-20 DIAGNOSIS — Z98891 History of uterine scar from previous surgery: Secondary | ICD-10-CM

## 2015-09-20 DIAGNOSIS — Z3493 Encounter for supervision of normal pregnancy, unspecified, third trimester: Secondary | ICD-10-CM

## 2015-09-20 DIAGNOSIS — Z3A38 38 weeks gestation of pregnancy: Secondary | ICD-10-CM | POA: Diagnosis not present

## 2015-09-20 DIAGNOSIS — O1493 Unspecified pre-eclampsia, third trimester: Secondary | ICD-10-CM

## 2015-09-20 DIAGNOSIS — E662 Morbid (severe) obesity with alveolar hypoventilation: Secondary | ICD-10-CM | POA: Diagnosis present

## 2015-09-20 DIAGNOSIS — O133 Gestational [pregnancy-induced] hypertension without significant proteinuria, third trimester: Secondary | ICD-10-CM

## 2015-09-20 DIAGNOSIS — Z6839 Body mass index (BMI) 39.0-39.9, adult: Secondary | ICD-10-CM | POA: Diagnosis not present

## 2015-09-20 LAB — URINALYSIS, ROUTINE W REFLEX MICROSCOPIC
GLUCOSE, UA: NEGATIVE mg/dL
Hgb urine dipstick: NEGATIVE
Ketones, ur: 15 mg/dL — AB
Leukocytes, UA: NEGATIVE
Nitrite: NEGATIVE
PH: 6 (ref 5.0–8.0)
Protein, ur: NEGATIVE mg/dL

## 2015-09-20 LAB — COMPREHENSIVE METABOLIC PANEL
ALT: 11 U/L — AB (ref 14–54)
AST: 16 U/L (ref 15–41)
Albumin: 2.8 g/dL — ABNORMAL LOW (ref 3.5–5.0)
Alkaline Phosphatase: 183 U/L — ABNORMAL HIGH (ref 38–126)
Anion gap: 6 (ref 5–15)
BUN: 11 mg/dL (ref 6–20)
CHLORIDE: 104 mmol/L (ref 101–111)
CO2: 22 mmol/L (ref 22–32)
CREATININE: 0.71 mg/dL (ref 0.44–1.00)
Calcium: 9 mg/dL (ref 8.9–10.3)
GFR calc Af Amer: >60 mL/min (ref >60)
Glucose, Bld: 93 mg/dL (ref 65–99)
Potassium: 3.8 mmol/L (ref 3.5–5.1)
Sodium: 132 mmol/L — ABNORMAL LOW (ref 135–145)
Total Bilirubin: 0.7 mg/dL (ref 0.3–1.2)
Total Protein: 6.7 g/dL (ref 6.5–8.1)

## 2015-09-20 LAB — CBC
HEMATOCRIT: 32.4 % — AB (ref 36.0–46.0)
HEMATOCRIT: 34 % — AB (ref 36.0–46.0)
Hemoglobin: 10.9 g/dL — ABNORMAL LOW (ref 12.0–15.0)
Hemoglobin: 11.4 g/dL — ABNORMAL LOW (ref 12.0–15.0)
MCH: 27.1 pg (ref 26.0–34.0)
MCH: 27 pg (ref 26.0–34.0)
MCHC: 33.6 g/dL (ref 30.0–36.0)
MCHC: 33.5 g/dL (ref 30.0–36.0)
MCV: 80.6 fL (ref 78.0–100.0)
MCV: 80.4 fL (ref 78.0–100.0)
PLATELETS: 356 10*3/uL (ref 150–400)
PLATELETS: 362 10*3/uL (ref 150–400)
RBC: 4.02 MIL/uL (ref 3.87–5.11)
RBC: 4.23 MIL/uL (ref 3.87–5.11)
RDW: 14.2 % (ref 11.5–15.5)
RDW: 14.2 % (ref 11.5–15.5)
WBC: 17.9 10*3/uL — AB (ref 4.0–10.5)
WBC: 20.9 10*3/uL — AB (ref 4.0–10.5)

## 2015-09-20 LAB — TYPE AND SCREEN
ABO/RH(D): B POS
Antibody Screen: NEGATIVE

## 2015-09-20 LAB — PROTEIN / CREATININE RATIO, URINE
Creatinine, Urine: 392 mg/dL
Protein Creatinine Ratio: 0.08 mg/mg{Cre} (ref 0.00–0.15)
Total Protein, Urine: 30 mg/dL

## 2015-09-20 SURGERY — Surgical Case
Anesthesia: Regional

## 2015-09-20 MED ORDER — CEFAZOLIN SODIUM-DEXTROSE 2-3 GM-% IV SOLR
INTRAVENOUS | Status: DC | PRN
  Administered 2015-09-20: 2 g via INTRAVENOUS

## 2015-09-20 MED ORDER — MAGNESIUM SULFATE BOLUS VIA INFUSION
4.0000 g | Freq: Once | INTRAVENOUS | Status: AC
  Administered 2015-09-20: 4 g via INTRAVENOUS
  Filled 2015-09-20: qty 500

## 2015-09-20 MED ORDER — SOD CITRATE-CITRIC ACID 500-334 MG/5ML PO SOLN
ORAL | Status: AC
  Administered 2015-09-20: 30 mL
  Filled 2015-09-20: qty 15

## 2015-09-20 MED ORDER — FENTANYL CITRATE (PF) 100 MCG/2ML IJ SOLN
INTRAMUSCULAR | Status: AC
  Filled 2015-09-20: qty 2

## 2015-09-20 MED ORDER — DEXAMETHASONE SODIUM PHOSPHATE 4 MG/ML IJ SOLN
INTRAMUSCULAR | Status: DC | PRN
  Administered 2015-09-20: 4 mg via INTRAVENOUS

## 2015-09-20 MED ORDER — ONDANSETRON HCL 4 MG/2ML IJ SOLN
INTRAMUSCULAR | Status: DC | PRN
  Administered 2015-09-20: 4 mg via INTRAVENOUS

## 2015-09-20 MED ORDER — DOCUSATE SODIUM 100 MG PO CAPS
100.0000 mg | ORAL_CAPSULE | Freq: Every day | ORAL | Status: DC
  Administered 2015-09-21: 100 mg via ORAL
  Filled 2015-09-20: qty 1

## 2015-09-20 MED ORDER — LACTATED RINGERS IV SOLN
2.0000 g/h | INTRAVENOUS | Status: DC
  Administered 2015-09-20 – 2015-09-21 (×2): 2 g/h via INTRAVENOUS
  Filled 2015-09-20 (×2): qty 80

## 2015-09-20 MED ORDER — SCOPOLAMINE 1 MG/3DAYS TD PT72
MEDICATED_PATCH | TRANSDERMAL | Status: DC | PRN
  Administered 2015-09-20: 1 via TRANSDERMAL

## 2015-09-20 MED ORDER — CEFAZOLIN SODIUM-DEXTROSE 2-4 GM/100ML-% IV SOLN: 2.0000 g | INTRAVENOUS | Status: DC

## 2015-09-20 MED ORDER — PHENYLEPHRINE HCL 10 MG/ML IJ SOLN
INTRAMUSCULAR | Status: DC | PRN
  Administered 2015-09-20: 200 ug via INTRAVENOUS
  Administered 2015-09-20: 80 ug via INTRAVENOUS
  Administered 2015-09-20: 40 ug via INTRAVENOUS
  Administered 2015-09-20 (×2): 200 ug via INTRAVENOUS
  Administered 2015-09-20: 80 ug via INTRAVENOUS
  Administered 2015-09-21 (×2): 40 ug via INTRAVENOUS

## 2015-09-20 MED ORDER — LACTATED RINGERS IV SOLN
INTRAVENOUS | Status: DC
  Administered 2015-09-20: 17:00:00 via INTRAVENOUS

## 2015-09-20 MED ORDER — MORPHINE SULFATE-NACL 0.5-0.9 MG/ML-% IV SOSY
PREFILLED_SYRINGE | INTRAVENOUS | Status: AC
  Filled 2015-09-20: qty 1

## 2015-09-20 MED ORDER — EPHEDRINE SULFATE 50 MG/ML IJ SOLN
INTRAMUSCULAR | Status: DC | PRN
  Administered 2015-09-20: 10 mg via INTRAVENOUS
  Administered 2015-09-20: 5 mg via INTRAVENOUS

## 2015-09-20 MED ORDER — LACTATED RINGERS IV SOLN: 125.0000 mL/h | INTRAVENOUS | Status: DC

## 2015-09-20 MED ORDER — OXYTOCIN 10 UNIT/ML IJ SOLN
INTRAMUSCULAR | Status: DC | PRN
  Administered 2015-09-20: 40 [IU] via INTRAVENOUS

## 2015-09-20 SURGICAL SUPPLY — 32 items
BENZOIN TINCTURE PRP APPL 2/3 (GAUZE/BANDAGES/DRESSINGS) ×3 IMPLANT
CHLORAPREP W/TINT 26ML (MISCELLANEOUS) ×3 IMPLANT
CLAMP CORD UMBIL (MISCELLANEOUS) IMPLANT
CLOSURE STERI STRIP 1/2 X4 (GAUZE/BANDAGES/DRESSINGS) ×2 IMPLANT
CLOSURE WOUND 1/2 X4 (GAUZE/BANDAGES/DRESSINGS) ×1
CLOTH BEACON ORANGE TIMEOUT ST (SAFETY) ×3 IMPLANT
DRSG OPSITE POSTOP 4X10 (GAUZE/BANDAGES/DRESSINGS) ×3 IMPLANT
ELECT REM PT RETURN 9FT ADLT (ELECTROSURGICAL) ×3
ELECTRODE REM PT RTRN 9FT ADLT (ELECTROSURGICAL) ×1 IMPLANT
EXTRACTOR VACUUM M CUP 4 TUBE (SUCTIONS) IMPLANT
EXTRACTOR VACUUM M CUP 4' TUBE (SUCTIONS)
GLOVE BIOGEL PI IND STRL 7.0 (GLOVE) ×2 IMPLANT
GLOVE BIOGEL PI IND STRL 7.5 (GLOVE) ×2 IMPLANT
GLOVE BIOGEL PI INDICATOR 7.0 (GLOVE) ×4
GLOVE BIOGEL PI INDICATOR 7.5 (GLOVE) ×4
GLOVE ECLIPSE 7.5 STRL STRAW (GLOVE) ×3 IMPLANT
GOWN STRL REUS W/TWL LRG LVL3 (GOWN DISPOSABLE) ×9 IMPLANT
KIT ABG SYR 3ML LUER SLIP (SYRINGE) IMPLANT
NEEDLE HYPO 25X5/8 SAFETYGLIDE (NEEDLE) IMPLANT
NS IRRIG 1000ML POUR BTL (IV SOLUTION) ×3 IMPLANT
PACK C SECTION WH (CUSTOM PROCEDURE TRAY) ×3 IMPLANT
PAD OB MATERNITY 4.3X12.25 (PERSONAL CARE ITEMS) ×3 IMPLANT
PENCIL SMOKE EVAC W/HOLSTER (ELECTROSURGICAL) ×3 IMPLANT
RTRCTR C-SECT PINK 25CM LRG (MISCELLANEOUS) ×3 IMPLANT
STRIP CLOSURE SKIN 1/2X4 (GAUZE/BANDAGES/DRESSINGS) ×2 IMPLANT
SUT VIC AB 0 CTX 36 (SUTURE) ×6
SUT VIC AB 0 CTX36XBRD ANBCTRL (SUTURE) ×3 IMPLANT
SUT VIC AB 2-0 CT1 27 (SUTURE) ×2
SUT VIC AB 2-0 CT1 TAPERPNT 27 (SUTURE) ×1 IMPLANT
SUT VIC AB 4-0 KS 27 (SUTURE) ×3 IMPLANT
TOWEL OR 17X24 6PK STRL BLUE (TOWEL DISPOSABLE) ×3 IMPLANT
TRAY FOLEY CATH SILVER 14FR (SET/KITS/TRAYS/PACK) ×3 IMPLANT

## 2015-09-20 NOTE — Progress Notes (Signed)
Subjective:  Suzanne Bush is a 30 y.o. [id number] at 9265w3d being seen today for ongoing prenatal care.  She is currently monitored for the following issues for this high-risk pregnancy and has Supervision of normal pregnancy in third trimester; Obesity affecting pregnancy in third trimester; BMI 40.0-44.9, adult (HCC); History of cesarean delivery; and Gestational hypertension w/o significant proteinuria in 3rd trimester on her problem list.  Patient reports no complaints.  Contractions: Not present. Vag. Bleeding: None.  Movement: Present. Denies leaking of fluid.   The following portions of the patient's history were reviewed and updated as appropriate: allergies, current medications, past family history, past medical history, past social history, past surgical history and problem list. Problem list updated.  Objective:   Vitals:   09/20/15 1354  BP: (!) 147/99  Pulse: (!) 102  Weight: 243 lb (110.2 kg)    Fetal Status: Fetal Heart Rate (bpm): 143 Fundal Height: 40 cm Movement: Present     General:  Alert, oriented and cooperative. Patient is in no acute distress.  Skin: Skin is warm and dry. No rash noted.   Cardiovascular: Normal heart rate noted  Respiratory: Normal respiratory effort, no problems with respiration noted  Abdomen: Soft, gravid, appropriate for gestational age. Pain/Pressure: Present     Pelvic:  Cervical exam performed Dilation: Fingertip Effacement (%): 30 Station: Ballotable  Extremities: Normal range of motion.  Edema: Mild pitting, slight indentation  Mental Status: Normal mood and affect. Normal behavior. Normal judgment and thought content.   Urinalysis: Urine Protein: Negative Urine Glucose: Negative  Assessment and Plan:  Pregnancy: G4P1021 at 5065w3d  1. Gestational hypertension w/o significant proteinuria in 3rd trimester Elevated BP today  2. Supervision of normal pregnancy in third trimester Prev CS  Term labor symptoms and general obstetric  precautions including but not limited to vaginal bleeding, contractions, leaking of fluid and fetal movement were reviewed in detail with the patient. Please refer to After Visit Summary for other counseling recommendations.  Return in about 4 days (around 09/24/2015).  To MAu for evaluation Adam PhenixJames G Arnold, MD

## 2015-09-20 NOTE — H&P (Signed)
Suzanne Bush is a 30 y.o. female presenting for admission due to elevated blood pressure and recent onset of headache.  [facility] pregnancy dated by LMP consistent with 18 wk ultrasound.  Upon review of the record patient has had elevated blood pressure at two prior prenatal visits ranging from 140's/90-100's.  +headache today with no report of epigastric pain or vision changes.  Denies any additional problems during the pregnancy.  OB History    Gravida Para Term Preterm AB Living   4 1 1  0 2 1   SAB TAB Ectopic Multiple Live Births   2 0 0 0 1      Obstetric Comments   05/2009: pLTCS for NRFHT. 7lbs 2oz. 1 layer chromic closure     Past Medical History:  Diagnosis Date  . Medical history non-contributory    Past Surgical History:  Procedure Laterality Date  . CESAREAN SECTION    . CHOLECYSTECTOMY  after baby was born   Family History: family history is not on file. Social History:  reports that she has been smoking Cigarettes.  She has been smoking about 0.25 packs per day. She has never used smokeless tobacco. She reports that she does not drink alcohol or use drugs.       ROS History Dilation: Closed Exam by:: ZOXWRUEWalidah CNM Blood pressure 157/100, pulse 97, temperature 98.5 F (36.9 C), temperature source Oral, resp. rate 16, last menstrual period 12/25/2014. Maternal Exam:  Introitus: Vagina is positive for vaginal discharge (mucusy).    Physical Exam  Constitutional: She is oriented to person, place, and time. She appears well-developed and well-nourished.  HENT:  Head: Normocephalic.  Neck: Normal range of motion. Neck supple.  Cardiovascular: Normal rate, regular rhythm and normal heart sounds.   Respiratory: Effort normal and breath sounds normal. No respiratory distress.  GI: Soft. There is no tenderness.  Genitourinary: No bleeding in the vagina. Vaginal discharge (mucusy) found.  Musculoskeletal: Normal range of motion. She  exhibits no edema.  Neurological: She is alert and oriented to person, place, and time.  Skin: Skin is warm and dry.    Prenatal labs: ABO, Rh: B/Positive/-- (02/23 0000) Antibody: Negative (02/23 0000) Rubella: Immune (02/23 0000) RPR: Non Reactive (07/27 1409)  HBsAg: Negative (02/23 0000)  HIV: Non Reactive (07/27 1409)  GBS:     FHR 130's, +accels Toco - irregular  Dilation: Closed Exam by:: AVWUJWJWalidah CNM  Assessment/Plan: 30 y.o. [id number] at 5975w3d IUP  Preeclampsia w/Severe Features Hx of Prior CSection  Plan: Consulted with Dr. Adrian BlackwaterStinson > agrees with plan of care Admit to HR Antenatal Unit Magnesium Sulfate NPO Repeat Csection tonight  Rochele PagesKARIM, Zaivion Kundrat N 09/20/2015, 4:32 PM

## 2015-09-20 NOTE — MAU Provider Note (Signed)
History   [id number]   Chief Complaint  Patient presents with  . Hypertension    HPI Suzanne Bush is a 30 y.o. female  G4P1021 at 851w3d IUP sent over from office for elevated blood pressures.  In the office patient blood pressures reported as 147/99.  Pt reports headache.  Denies epigastric pain or vision changes.  Denies vaginal bleeding, leaking of fluid.  +occasional contractions.  +fetal movement.  Per review of chart patient had elevated blood pressure on 8/10 (140/90) and 8/14 (141/101).  Last meal 1500.   Patient's last menstrual period was 12/25/2014.  OB History  Gravida Para Term Preterm AB Living  4 1 1  0 2 1  SAB TAB Ectopic Multiple Live Births  2 0 0 0 1    # Outcome Date GA Lbr Len/2nd Weight Sex Delivery Anes PTL Lv  4 Current           3 Term 06/04/09    F CS-LTranv   LIV  2 SAB           1 SAB             Obstetric Comments  05/2009: pLTCS for NRFHT. 7lbs 2oz. 1 layer chromic closure    Past Medical History:  Diagnosis Date  . Medical history non-contributory     No family history on file.  Social History   Social History  . Marital status: Single    Spouse name: N/A  . Number of children: N/A  . Years of education: N/A   Social History Main Topics  . Smoking status: Current Every Day Smoker    Packs/day: 0.25    Types: Cigarettes  . Smokeless tobacco: Not on file  . Alcohol use No  . Drug use: No  . Sexual activity: Yes    Birth control/ protection: Condom, None   Other Topics Concern  . Not on file   Social History Narrative  . No narrative on file    No Known Allergies  No current facility-administered medications on file prior to encounter.    Current Outpatient Prescriptions on File Prior to Encounter  Medication Sig Dispense Refill  . ferrous sulfate 325 (65 FE) MG tablet Take 325 mg by mouth daily with breakfast.    . Prenatal Vit-Fe Fumarate-FA (PRENATAL MULTIVITAMIN) TABS tablet Take 1 tablet by mouth daily at 12  noon.       Review of Systems  Constitutional: Negative for fever.  HENT: Negative for congestion.   Eyes: Negative for visual disturbance.  Cardiovascular: Negative for chest pain.  Gastrointestinal: Negative for abdominal pain.  Genitourinary: Negative for vaginal bleeding and vaginal discharge.  Neurological: Positive for headaches.     Physical Exam   Vitals:   09/20/15 1604  BP: 144/95  Pulse: 97  Resp: 16  Temp: 98.5 F (36.9 C)  TempSrc: Oral   Vitals:   09/20/15 1604 09/20/15 1614 09/20/15 1616  BP: 144/95 155/96 157/100  Pulse: 97 100 97  Resp: 16    Temp: 98.5 F (36.9 C)    TempSrc: Oral      Physical Exam  Constitutional: She is oriented to person, place, and time. She appears well-developed and well-nourished. No distress.  HENT:  Head: Normocephalic.  Neck: Normal range of motion. Neck supple.  Cardiovascular: Normal rate, regular rhythm and normal heart sounds.   Respiratory: Effort normal and breath sounds normal. No respiratory distress.  GI: Soft. There is no tenderness.  Genitourinary: No bleeding in  the vagina. Vaginal discharge (mucusy) found.  Musculoskeletal: Normal range of motion. She exhibits edema (3+ pedal ).  Neurological: She is alert and oriented to person, place, and time. She displays normal reflexes.  Skin: Skin is warm and dry.   Dilation: Closed Exam by:: UJWJXBJWalidah CNM  MAU Course  Procedures   Assessment and Plan  30 y.o. [id number] at 5754w3d IUP  Preeclampsia w/Severe Features Hx of Prior CSection  Plan: Consulted with Dr. Adrian BlackwaterStinson > agrees with plan of care Admit to HR Antenatal Unit Magnesium Sulfate NPO Repeat Csection tonight  Marlis EdelsonWalidah N Karim, CNM 09/20/2015 4:11 PM

## 2015-09-20 NOTE — MAU Note (Signed)
Sent from the office for high BP.  Pt is having headaches and seeing black spots

## 2015-09-20 NOTE — Anesthesia Preprocedure Evaluation (Addendum)
Anesthesia Evaluation  Patient identified by MRN, date of birth, ID band Patient awake and Patient confused    Reviewed: Allergy & Precautions, NPO status , Patient's Chart, lab work & pertinent test results  Airway Mallampati: II  TM Distance: >3 FB Neck ROM: Full    Dental no notable dental hx.    Pulmonary Current Smoker,    Pulmonary exam normal breath sounds clear to auscultation       Cardiovascular hypertension, Normal cardiovascular exam Rhythm:Regular Rate:Normal  Severe preeclampsia   Neuro/Psych negative neurological ROS  negative psych ROS   GI/Hepatic negative GI ROS, Neg liver ROS,   Endo/Other  Morbid obesity  Renal/GU negative Renal ROS  negative genitourinary   Musculoskeletal negative musculoskeletal ROS (+)   Abdominal (+) + obese,   Peds negative pediatric ROS (+)  Hematology negative hematology ROS (+)   Anesthesia Other Findings   Reproductive/Obstetrics negative OB ROS                            Anesthesia Physical Anesthesia Plan  ASA: III  Anesthesia Plan: Spinal   Post-op Pain Management:    Induction: Intravenous  Airway Management Planned: Natural Airway  Additional Equipment:   Intra-op Plan:   Post-operative Plan: Extubation in OR  Informed Consent: I have reviewed the patients History and Physical, chart, labs and discussed the procedure including the risks, benefits and alternatives for the proposed anesthesia with the patient or authorized representative who has indicated his/her understanding and acceptance.   Dental advisory given  Plan Discussed with: CRNA  Anesthesia Plan Comments: (Informed consent obtained prior to proceeding including risk of failure, 1% risk of PDPH, risk of minor discomfort and bruising.  Discussed rare but serious complications including epidural abscess, permanent nerve injury, epidural hematoma.  Discussed  alternatives to spinal analgesia and patient desires to proceed.  Timeout performed pre-procedure verifying patient name, procedure, and platelet count.  Patient tolerated procedure well.)       Anesthesia Quick Evaluation

## 2015-09-20 NOTE — Consult Note (Signed)
[facility]  Delivery Note:  C-section       09/20/2015  11:44 PM  I was called to the operating room at the request of the patient's obstetrician (Dr. Stinson) for a repeat c-section.  PRENATAL HX:  This is a 30 y/o G4P1021 at 38 and 3/[redacted] weeks gestation who was admitted today for severe pre-eclampsia.  Her pregnancy has otherwise been complicated.  Delivery by c-section for severe features.    INTRAPARTUM HX:   Repeat c-section with AROM at delivery  DELIVERY:  Infant was vigorous at delivery, requiring no resuscitation other than standard warming, drying and stimulation.  APGARs 8 and 9.  Exam within normal limits.  After 5 minutes, baby left with nurse to assist parents with skin-to-skin care.   _____________________ Electronically Signed By: Nikitha Mode, MD Neonatologist  

## 2015-09-20 NOTE — Patient Instructions (Signed)
Hypertension During Pregnancy °Hypertension is also called high blood pressure. Blood pressure moves blood in your body. Sometimes, the force that moves the blood becomes too strong. When you are pregnant, this condition should be watched carefully. It can cause problems for you and your baby. °HOME CARE  °· Make and keep all of your doctor visits. °· Take medicine as told by your doctor. Tell your doctor about all medicines you take. °· Eat very little salt. °· Exercise regularly. °· Do not drink alcohol. °· Do not smoke. °· Do not have drinks with caffeine. °· Lie on your left side when resting. °· Your health care provider may ask you to take one low-dose aspirin (81mg) each day. °GET HELP RIGHT AWAY IF: °· You have bad belly (abdominal) pain. °· You have sudden puffiness (swelling) in the hands, ankles, or face. °· You gain 4 pounds (1.8 kilograms) or more in 1 week. °· You throw up (vomit) repeatedly. °· You have bleeding from the vagina. °· You do not feel the baby moving as much. °· You have a headache. °· You have blurred or double vision. °· You have muscle twitching or spasms. °· You have shortness of breath. °· You have blue fingernails and lips. °· You have blood in your pee (urine). °MAKE SURE YOU: °· Understand these instructions. °· Will watch your condition. °· Will get help right away if you are not doing well or get worse. °  °This information is not intended to replace advice given to you by your health care provider. Make sure you discuss any questions you have with your health care provider. °  °Document Released: 02/18/2010 Document Revised: 02/06/2014 Document Reviewed: 08/15/2012 °Elsevier Interactive Patient Education ©2016 [employer]. ° °

## 2015-09-20 NOTE — Progress Notes (Signed)
Patient ID: Suzanne Bush, female   DOB: 1985-02-09, 30 y.o.   MRN: [id number] The risks of cesarean section discussed with the patient included but were not limited to: bleeding which may require transfusion or reoperation; infection which may require antibiotics; injury to bowel, bladder, ureters or other surrounding organs; injury to the fetus; need for additional procedures including hysterectomy in the event of a life-threatening hemorrhage; placental abnormalities wth subsequent pregnancies, incisional problems, thromboembolic phenomenon and other postoperative/anesthesia complications. The patient concurred with the proposed plan, giving informed written consent for the procedure.   Patient has been NPO since 3pm she will remain NPO for procedure. Anesthesia and OR aware.  Preoperative prophylactic Ancef ordered on call to the OR.  To OR when ready.  Suzanne HeritageJacob J Yovani Cogburn, DO 09/20/2015 8:53 PM

## 2015-09-21 DIAGNOSIS — O1414 Severe pre-eclampsia complicating childbirth: Secondary | ICD-10-CM

## 2015-09-21 DIAGNOSIS — O99214 Obesity complicating childbirth: Secondary | ICD-10-CM

## 2015-09-21 DIAGNOSIS — Z3A38 38 weeks gestation of pregnancy: Secondary | ICD-10-CM

## 2015-09-21 DIAGNOSIS — O99334 Smoking (tobacco) complicating childbirth: Secondary | ICD-10-CM

## 2015-09-21 DIAGNOSIS — Z98891 History of uterine scar from previous surgery: Secondary | ICD-10-CM

## 2015-09-21 DIAGNOSIS — O1413 Severe pre-eclampsia, third trimester: Secondary | ICD-10-CM

## 2015-09-21 LAB — CBC
HEMATOCRIT: 32.1 % — AB (ref 36.0–46.0)
Hemoglobin: 10.8 g/dL — ABNORMAL LOW (ref 12.0–15.0)
MCH: 27.1 pg (ref 26.0–34.0)
MCHC: 33.6 g/dL (ref 30.0–36.0)
MCV: 80.5 fL (ref 78.0–100.0)
PLATELETS: 334 10*3/uL (ref 150–400)
RBC: 3.99 MIL/uL (ref 3.87–5.11)
RDW: 14.1 % (ref 11.5–15.5)
WBC: 22.4 10*3/uL — AB (ref 4.0–10.5)

## 2015-09-21 LAB — MAGNESIUM: Magnesium: 4.9 mg/dL — ABNORMAL HIGH (ref 1.7–2.4)

## 2015-09-21 MED ORDER — NALOXONE HCL 2 MG/2ML IJ SOSY
1.0000 ug/kg/h | PREFILLED_SYRINGE | INTRAVENOUS | Status: DC | PRN
  Filled 2015-09-21: qty 2

## 2015-09-21 MED ORDER — DIPHENHYDRAMINE HCL 50 MG/ML IJ SOLN: 12.5000 mg | INTRAMUSCULAR | Status: DC | PRN

## 2015-09-21 MED ORDER — ACETAMINOPHEN 325 MG PO TABS: 650.0000 mg | ORAL_TABLET | ORAL | Status: DC | PRN

## 2015-09-21 MED ORDER — ONDANSETRON HCL 4 MG/2ML IJ SOLN
INTRAMUSCULAR | Status: AC
  Filled 2015-09-21: qty 2

## 2015-09-21 MED ORDER — EPHEDRINE 5 MG/ML INJ
INTRAVENOUS | Status: AC
  Filled 2015-09-21: qty 10

## 2015-09-21 MED ORDER — CEFAZOLIN SODIUM-DEXTROSE 2-4 GM/100ML-% IV SOLN
INTRAVENOUS | Status: AC
  Filled 2015-09-21: qty 100

## 2015-09-21 MED ORDER — NALBUPHINE HCL 10 MG/ML IJ SOLN: 5.0000 mg | INTRAMUSCULAR | Status: DC | PRN

## 2015-09-21 MED ORDER — MENTHOL 3 MG MT LOZG
1.0000 | LOZENGE | OROMUCOSAL | Status: DC | PRN
  Filled 2015-09-21: qty 9

## 2015-09-21 MED ORDER — LACTATED RINGERS IV SOLN
INTRAVENOUS | Status: DC
  Administered 2015-09-21 (×2): via INTRAVENOUS

## 2015-09-21 MED ORDER — ONDANSETRON HCL 4 MG/2ML IJ SOLN: 4.0000 mg | Freq: Three times a day (TID) | INTRAMUSCULAR | Status: DC | PRN

## 2015-09-21 MED ORDER — NALOXONE HCL 0.4 MG/ML IJ SOLN: 0.4000 mg | INTRAMUSCULAR | Status: DC | PRN

## 2015-09-21 MED ORDER — PROMETHAZINE HCL 25 MG/ML IJ SOLN: 6.2500 mg | INTRAMUSCULAR | Status: DC | PRN

## 2015-09-21 MED ORDER — KETOROLAC TROMETHAMINE 30 MG/ML IJ SOLN
INTRAMUSCULAR | Status: AC
  Filled 2015-09-21: qty 1

## 2015-09-21 MED ORDER — BUPIVACAINE IN DEXTROSE 0.75-8.25 % IT SOLN
INTRATHECAL | Status: DC | PRN
  Administered 2015-09-20: 1.6 mL via INTRATHECAL

## 2015-09-21 MED ORDER — NALBUPHINE HCL 10 MG/ML IJ SOLN: 5.0000 mg | Freq: Once | INTRAMUSCULAR | Status: DC | PRN

## 2015-09-21 MED ORDER — SODIUM CHLORIDE 0.9% FLUSH: 3.0000 mL | INTRAVENOUS | Status: DC | PRN

## 2015-09-21 MED ORDER — PHENYLEPHRINE 8 MG IN D5W 100 ML (0.08MG/ML) PREMIX OPTIME
INJECTION | INTRAVENOUS | Status: DC | PRN
  Administered 2015-09-21: 100 ug/min via INTRAVENOUS

## 2015-09-21 MED ORDER — OXYTOCIN 40 UNITS IN LACTATED RINGERS INFUSION - SIMPLE MED: 2.5000 [IU]/h | INTRAVENOUS | Status: AC

## 2015-09-21 MED ORDER — DIPHENHYDRAMINE HCL 25 MG PO CAPS
25.0000 mg | ORAL_CAPSULE | Freq: Four times a day (QID) | ORAL | Status: DC | PRN
  Filled 2015-09-21: qty 1

## 2015-09-21 MED ORDER — MORPHINE SULFATE (PF) 0.5 MG/ML IJ SOLN
INTRAMUSCULAR | Status: DC | PRN
  Administered 2015-09-20: .2 mg via EPIDURAL

## 2015-09-21 MED ORDER — PRENATAL MULTIVITAMIN CH
1.0000 | ORAL_TABLET | Freq: Every day | ORAL | Status: DC
  Administered 2015-09-21 – 2015-09-23 (×3): 1 via ORAL
  Filled 2015-09-21 (×4): qty 1

## 2015-09-21 MED ORDER — MEPERIDINE HCL 25 MG/ML IJ SOLN: 6.2500 mg | INTRAMUSCULAR | Status: DC | PRN

## 2015-09-21 MED ORDER — SIMETHICONE 80 MG PO CHEW
80.0000 mg | CHEWABLE_TABLET | Freq: Three times a day (TID) | ORAL | Status: DC
  Administered 2015-09-21 – 2015-09-23 (×7): 80 mg via ORAL
  Filled 2015-09-21 (×10): qty 1

## 2015-09-21 MED ORDER — OXYCODONE-ACETAMINOPHEN 5-325 MG PO TABS
2.0000 | ORAL_TABLET | ORAL | Status: DC | PRN
Start: 2015-09-21 — End: 2015-09-23
  Administered 2015-09-23: 2 via ORAL
  Filled 2015-09-21 (×2): qty 2

## 2015-09-21 MED ORDER — SIMETHICONE 80 MG PO CHEW
80.0000 mg | CHEWABLE_TABLET | ORAL | Status: DC | PRN
  Filled 2015-09-21: qty 1

## 2015-09-21 MED ORDER — OXYCODONE-ACETAMINOPHEN 5-325 MG PO TABS
1.0000 | ORAL_TABLET | ORAL | Status: DC | PRN
  Administered 2015-09-23: 1 via ORAL
  Filled 2015-09-21: qty 1

## 2015-09-21 MED ORDER — SIMETHICONE 80 MG PO CHEW
80.0000 mg | CHEWABLE_TABLET | ORAL | Status: DC
  Administered 2015-09-22 – 2015-09-23 (×2): 80 mg via ORAL
  Filled 2015-09-21 (×3): qty 1

## 2015-09-21 MED ORDER — SCOPOLAMINE 1 MG/3DAYS TD PT72: 1.0000 | MEDICATED_PATCH | Freq: Once | TRANSDERMAL | Status: DC

## 2015-09-21 MED ORDER — COCONUT OIL OIL
1.0000 "application " | TOPICAL_OIL | Status: DC | PRN
  Filled 2015-09-21 (×2): qty 120

## 2015-09-21 MED ORDER — TETANUS-DIPHTH-ACELL PERTUSSIS 5-2.5-18.5 LF-MCG/0.5 IM SUSP
0.5000 mL | Freq: Once | INTRAMUSCULAR | Status: AC
  Administered 2015-09-23: 0.5 mL via INTRAMUSCULAR
  Filled 2015-09-21: qty 0.5

## 2015-09-21 MED ORDER — FENTANYL CITRATE (PF) 100 MCG/2ML IJ SOLN
INTRAMUSCULAR | Status: DC | PRN
  Administered 2015-09-20: 20 ug via INTRAVENOUS

## 2015-09-21 MED ORDER — PHENYLEPHRINE 40 MCG/ML (10ML) SYRINGE FOR IV PUSH (FOR BLOOD PRESSURE SUPPORT)
PREFILLED_SYRINGE | INTRAVENOUS | Status: AC
  Filled 2015-09-21: qty 50

## 2015-09-21 MED ORDER — KETOROLAC TROMETHAMINE 30 MG/ML IJ SOLN: 30.0000 mg | Freq: Four times a day (QID) | INTRAMUSCULAR | Status: AC | PRN

## 2015-09-21 MED ORDER — GLYCOPYRROLATE 0.2 MG/ML IJ SOLN
INTRAMUSCULAR | Status: DC | PRN
  Administered 2015-09-20: 0.2 mg via INTRAVENOUS

## 2015-09-21 MED ORDER — DEXAMETHASONE SODIUM PHOSPHATE 4 MG/ML IJ SOLN
INTRAMUSCULAR | Status: AC
  Filled 2015-09-21: qty 1

## 2015-09-21 MED ORDER — KETOROLAC TROMETHAMINE 30 MG/ML IJ SOLN
30.0000 mg | Freq: Four times a day (QID) | INTRAMUSCULAR | Status: AC | PRN
  Administered 2015-09-21: 30 mg via INTRAMUSCULAR

## 2015-09-21 MED ORDER — ZOLPIDEM TARTRATE 5 MG PO TABS: 5.0000 mg | ORAL_TABLET | Freq: Every evening | ORAL | Status: DC | PRN

## 2015-09-21 MED ORDER — OXYTOCIN 10 UNIT/ML IJ SOLN
INTRAMUSCULAR | Status: AC
  Filled 2015-09-21: qty 4

## 2015-09-21 MED ORDER — DIPHENHYDRAMINE HCL 25 MG PO CAPS
25.0000 mg | ORAL_CAPSULE | ORAL | Status: DC | PRN
  Filled 2015-09-21: qty 1

## 2015-09-21 MED ORDER — LACTATED RINGERS IV SOLN
INTRAVENOUS | Status: DC | PRN
  Administered 2015-09-20: 23:00:00 via INTRAVENOUS

## 2015-09-21 MED ORDER — PHENYLEPHRINE 8 MG IN D5W 100 ML (0.08MG/ML) PREMIX OPTIME
INJECTION | INTRAVENOUS | Status: AC
  Filled 2015-09-21: qty 100

## 2015-09-21 MED ORDER — SCOPOLAMINE 1 MG/3DAYS TD PT72
MEDICATED_PATCH | TRANSDERMAL | Status: AC
  Filled 2015-09-21: qty 1

## 2015-09-21 MED ORDER — FENTANYL CITRATE (PF) 100 MCG/2ML IJ SOLN: 25.0000 ug | INTRAMUSCULAR | Status: DC | PRN

## 2015-09-21 MED ORDER — SENNOSIDES-DOCUSATE SODIUM 8.6-50 MG PO TABS
2.0000 | ORAL_TABLET | ORAL | Status: DC
  Administered 2015-09-22 – 2015-09-23 (×2): 2 via ORAL
  Filled 2015-09-21 (×3): qty 2

## 2015-09-21 MED ORDER — IBUPROFEN 600 MG PO TABS
600.0000 mg | ORAL_TABLET | Freq: Four times a day (QID) | ORAL | Status: DC
  Administered 2015-09-21 – 2015-09-23 (×8): 600 mg via ORAL
  Filled 2015-09-21 (×9): qty 1

## 2015-09-21 NOTE — Op Note (Signed)
Suzanne LoraNatasha K Bush PROCEDURE DATE: 09/20/2015  PREOPERATIVE DIAGNOSIS: Intrauterine pregnancy at  6551w4d weeks gestation; previous cesarean, preeclampsia with severe features  POSTOPERATIVE DIAGNOSIS: The same  PROCEDURE: Repeat Low Transverse Cesarean Section  SURGEON:  Dr. Candelaria CelesteJacob Marketa Midkiff  ASSISTANT: Dr Gailen ShelterBeth Mumaw  INDICATIONS: Suzanne Loraatasha K Bush is a 30 y.o. [id number] at 2851w4d scheduled for cesarean section secondary to prior cesarean, preeclampsia with severe features.  The risks of cesarean section discussed with the patient included but were not limited to: bleeding which may require transfusion or reoperation; infection which may require antibiotics; injury to bowel, bladder, ureters or other surrounding organs; injury to the fetus; need for additional procedures including hysterectomy in the event of a life-threatening hemorrhage; placental abnormalities wth subsequent pregnancies, incisional problems, thromboembolic phenomenon and other postoperative/anesthesia complications. The patient concurred with the proposed plan, giving informed written consent for the procedure.    FINDINGS:  Viable female infant in vertex presentation.  Apgars 8 and 9, weight pending.  Clear amniotic fluid.  Intact placenta, three vessel cord.  Normal uterus, fallopian tubes and ovaries bilaterally.  ANESTHESIA:    Spinal INTRAVENOUS FLUIDS:900 ml ESTIMATED BLOOD LOSS: 600 ml URINE OUTPUT:  150 ml SPECIMENS: Placenta sent to L&D COMPLICATIONS: None immediate  PROCEDURE IN DETAIL:  The patient received intravenous antibiotics and had sequential compression devices applied to her lower extremities while in the preoperative area.  She was then taken to the operating room where spinal anesthesia was administered and was found to be adequate. She was then placed in a dorsal supine position with a leftward tilt, and prepped and draped in a sterile manner.  A foley catheter was placed into her bladder and attached to  constant gravity, which drained clear fluid throughout.  After an adequate timeout was performed, a Pfannenstiel skin incision was made with scalpel and carried through to the underlying layer of fascia. The fascia was incised in the midline and this incision was extended bilaterally using the Mayo scissors. Kocher clamps were applied to the superior aspect of the fascial incision and the underlying rectus muscles were dissected off bluntly. A similar process was carried out on the inferior aspect of the facial incision. The rectus muscles were separated in the midline bluntly and the peritoneum was entered bluntly. An Alexis retractor was placed to aid in visualization of the uterus.  Attention was turned to the lower uterine segment where a transverse hysterotomy was made with a scalpel and extended bilaterally bluntly. The infant was successfully delivered, and cord was clamped and cut and infant was handed over to awaiting neonatology team. Uterine massage was then administered and the placenta delivered intact with three-vessel cord. The uterus was then cleared of clot and debris.  The hysterotomy was closed with 0 Vicryl in a running locked fashion, and an imbricating layer was also placed with a 0 Vicryl. Overall, excellent hemostasis was noted. The abdomen and the pelvis were cleared of all clot and debris and the Jon Gillslexis was removed. Hemostasis was confirmed on all surfaces.  The peritoneum was reapproximated using 2-0 vicryl running stitches. The fascia was then closed using 0 Vicryl in a running fashion.  The skin was closed with 4-0 vicryl. The patient tolerated the procedure well. Sponge, lap, instrument and needle counts were correct x 2. She was taken to the recovery room in stable condition.    Suzanne HeritageJacob J Shavaughn Seidl, DO 09/21/2015 12:29 AM

## 2015-09-21 NOTE — Transfer of Care (Signed)
Immediate Anesthesia Transfer of Care Note  Patient: Suzanne Bush  Procedure(s) Performed: Procedure(s): CESAREAN SECTION (N/A)  Patient Location: PACU  Anesthesia Type:Spinal  Level of Consciousness: awake, alert  and oriented  Airway & Oxygen Therapy: Patient Spontanous Breathing  Post-op Assessment: Report given to RN and Post -op Vital signs reviewed and stable  Post vital signs: Reviewed and stable  Last Vitals:  Vitals:   09/20/15 2055 09/20/15 2238  BP: (!) 145/98 (!) 133/96  Pulse: 90 90  Resp: 18 18  Temp: 36.7 C     Last Pain:  Vitals:   09/20/15 2055  TempSrc: Oral  PainSc: 0-No pain         Complications: No apparent anesthesia complications

## 2015-09-21 NOTE — Lactation Note (Signed)
This note was copied from a baby's chart. Lactation Consultation Note  Patient Name: Suzanne Bush ZOXWR'UToday's Date: 09/21/2015 Reason for consult: Follow-up assessment  With this mom of a term baby, now 4918 hour old. Mom was not able to stay awake for teaching earlier this morning, but is sitting up, awake and alert now. Mom is an experienced breast feeding mom, and reports breast feeding going well. Baby and Me book Bf teaching done. Mom reminded to avoid pacifier  Until baby at least 662 weeks old. Lactation services reviewed. Baby with moe than adequate wet and dirty diapers. Cluster feeding reiviwed with mom, and she knows to call for questions/concerns.    Maternal Data Has patient been taught Hand Expression?: Yes Does the patient have breastfeeding experience prior to this delivery?: Yes  Feeding    LATCH Score/Interventions                      Lactation Tools Discussed/Used     Consult Status Consult Status: Follow-up Date: 09/22/15 Follow-up type: In-patient    Alfred LevinsLee, Baron Parmelee Anne 09/21/2015, 6:20 PM

## 2015-09-21 NOTE — Anesthesia Postprocedure Evaluation (Signed)
Anesthesia Post Note  Patient: Suzanne Bush  Procedure(s) Performed: Procedure(s) (LRB): CESAREAN SECTION (N/A)  Patient location during evaluation: [facility] Anesthesia Type: Spinal Level of consciousness: awake, awake and alert and oriented Pain management: pain level controlled Vital Signs Assessment: post-procedure vital signs reviewed and stable Respiratory status: spontaneous breathing, nonlabored ventilation and respiratory function stable Cardiovascular status: stable Postop Assessment: no headache, no backache, no signs of nausea or vomiting, adequate PO intake and patient able to bend at knees Anesthetic complications: no     Last Vitals:  Vitals:   09/21/15 0630 09/21/15 0832  BP: 121/76 121/85  Pulse: 72 85  Resp: 16 17  Temp: 36.9 C 36.4 C    Last Pain:  Vitals:   09/21/15 0832  TempSrc: Oral  PainSc:    Pain Goal:                 Shron Ozer

## 2015-09-21 NOTE — Lactation Note (Signed)
This note was copied from a baby's chart. Lactation Consultation Note  Patient Name: Suzanne Bush Reason for consult: Initial assessment    With this nmom and term baby, now 1211 hours old the baby has had a good amount of documented breast feeds, and has had a void and stool already. The mom is on magnesium drip, and is very sleepy. Mag level is being drown . Mom allowed me to do hand expression ( which did not wake her ), and I spoon fed baby 2.5 ml's of colostrum. I told dad I would be back later to check on mom. Dad has baby skin to skin with him. I was not able to begin teaching with mom at this time.  Maternal Data    Feeding Feeding Type: Breast Milk Length of feed: 30 min  LATCH Score/Interventions       Type of Nipple: Everted at rest and after stimulation  Comfort (Breast/Nipple): Soft / non-tender           Lactation Tools Discussed/Used     Consult Status Consult Status: Follow-up Date: 09/21/15 Follow-up type: In-patient    Suzanne Bush, Suzanne Bush Anne Bush, 11:49 AM

## 2015-09-21 NOTE — Progress Notes (Signed)
Spoke with Dr. Shawnie PonsPratt about DC mag order at [phone]. MD stated to DC mag 24hrs after delivery, when will be at 2353.

## 2015-09-21 NOTE — Transfer of Care (Signed)
Immediate Anesthesia Transfer of Care Note  Patient: Suzanne Bush  Procedure(s) Performed: Procedure(s): CESAREAN SECTION (N/A)  Patient Location: PACU  Anesthesia Type:Spinal  Level of Consciousness: awake, alert  and oriented  Airway & Oxygen Therapy: Patient Spontanous Breathing  Post-op Assessment: Report given to RN and Post -op Vital signs reviewed and stable  Post vital signs: Reviewed and stable  Last Vitals:  Vitals:   09/21/15 1230 09/21/15 1632  BP: 106/66 114/69  Pulse: 84 86  Resp: 16 16  Temp: 36.6 C 37 C    Last Pain:  Vitals:   09/21/15 1632  TempSrc: Oral  PainSc:          Complications: No apparent anesthesia complications

## 2015-09-21 NOTE — Anesthesia Procedure Notes (Signed)
Spinal  Patient location during procedure: OR Start time: 09/20/2015 11:30 PM Staffing Anesthesiologist: Sherrian DiversENENNY, Ruble Pumphrey Preanesthetic Checklist Completed: patient identified, site marked, surgical consent, pre-op evaluation, timeout performed, IV checked, risks and benefits discussed and monitors and equipment checked Spinal Block Patient position: sitting Prep: DuraPrep Patient monitoring: blood pressure, continuous pulse ox, cardiac monitor and heart rate Approach: midline Location: L4-5 Injection technique: single-shot Needle Needle type: Sprotte  Needle gauge: 24 G Needle length: 10 cm Needle insertion depth: 9 cm

## 2015-09-22 ENCOUNTER — Encounter (HOSPITAL_COMMUNITY): Payer: Self-pay | Admitting: Family Medicine

## 2015-09-22 LAB — BIRTH TISSUE RECOVERY COLLECTION (PLACENTA DONATION)

## 2015-09-22 NOTE — Progress Notes (Addendum)
Subjective: Postpartum Day 2: Cesarean Delivery Patient reports tolerating PO, + flatus and no problems voiding.  Magnesium stopped at at 12mn.  Objective: Vital signs in last 24 hours: Temp:  [97.5 F (36.4 C)-99.1 F (37.3 C)] 99.1 F (37.3 C) (08/23 1400) Pulse Rate:  [77-90] 83 (08/23 1400) Resp:  [16-20] 18 (08/23 1400) BP: (106-135)/(50-95) 135/95 (08/23 1400) SpO2:  [97 %-100 %] 100 % (08/23 1400) Weight:  [241 lb 12 oz (109.7 kg)] 241 lb 12 oz (109.7 kg) (08/23 0545)   I/O last 3 completed shifts: In: 6823.1 [P.O.:3170; I.V.:3653.1] Out: 5050 [Urine:4450; Blood:600] Total I/O In: 480 [P.O.:480] Out: 600 [Urine:600]    Physical Exam:  General: alert and no distress Lochia: appropriate Uterine Fundus: firm Incision: dressing bloody however evidence of active bleeding. DVT Evaluation: No evidence of DVT seen on physical exam.   Recent Labs  09/20/15 2112 09/21/15 0614  HGB 11.4* 10.8*  HCT 34.0* 32.1*    Assessment/Plan: Status post Cesarean section. Doing well postoperatively.  Magnesium stopped pt without complaints. Continue current care. For discharge to home in the am Pt desires LnIUD for contraception Need to have Honeycomb dressing changed. Danni Shima L. Erin FullingHarraway-Smith, M.D., [facility]   HARRAWAY-SMITH, Ozie Lupe 09/22/2015, 3:42 PM

## 2015-09-22 NOTE — Lactation Note (Signed)
This note was copied from a baby's chart. Lactation Consultation Note  Patient Name: Suzanne Clois Dupesatasha Berryman HQION'GToday's Date: 09/22/2015 Reason for consult: Follow-up assessment Follow up visit made.  Mom has baby on breast in football hold.  Baby nursing actively.  She recently pumped 5 mls of colostrum.  Mom states she was told by nurse on night shift to breastfeed, post pump and give expressed milk and formula due to weight loss.  Baby is at a 7% weight loss and good output.  I instructed mom to continue feeding on cue using good breast massage during feeding, post pump and give any expressed milk back to baby.  Formula not necessary at this point.  Encouraged to call with concerns/assist.  Maternal Data    Feeding Feeding Type: Breast Fed Length of feed: 10 min  LATCH Score/Interventions Latch: Grasps breast easily, tongue down, lips flanged, rhythmical sucking.  Audible Swallowing: A few with stimulation  Type of Nipple: Everted at rest and after stimulation  Comfort (Breast/Nipple): Soft / non-tender     Hold (Positioning): No assistance needed to correctly position infant at breast. Intervention(s): Breastfeeding basics reviewed;Support Pillows  LATCH Score: 9  Lactation Tools Discussed/Used     Consult Status Consult Status: Follow-up Date: 09/23/15 Follow-up type: In-patient    Huston FoleyMOULDEN, [name] 09/22/2015, 2:39 PM

## 2015-09-22 NOTE — Progress Notes (Signed)
Honeycomb dressing changed per MD order

## 2015-09-23 ENCOUNTER — Encounter: Payer: Medicaid Other | Admitting: Obstetrics and Gynecology

## 2015-09-23 MED ORDER — OXYCODONE-ACETAMINOPHEN 5-325 MG PO TABS: 1.0000 | ORAL_TABLET | ORAL | 0 refills | Status: DC | PRN

## 2015-09-23 MED ORDER — LABETALOL HCL 5 MG/ML IV SOLN: 20.0000 mg | Freq: Once | INTRAVENOUS | Status: DC | PRN

## 2015-09-23 MED ORDER — AMLODIPINE BESYLATE 5 MG PO TABS: 5.0000 mg | ORAL_TABLET | Freq: Every day | ORAL | 1 refills | Status: DC

## 2015-09-23 MED ORDER — DOCUSATE SODIUM 100 MG PO CAPS: 100.0000 mg | ORAL_CAPSULE | Freq: Two times a day (BID) | ORAL | 0 refills | Status: DC

## 2015-09-23 MED ORDER — IBUPROFEN 600 MG PO TABS: 600.0000 mg | ORAL_TABLET | Freq: Four times a day (QID) | ORAL | 0 refills | Status: DC

## 2015-09-23 MED ORDER — AMLODIPINE BESYLATE 5 MG PO TABS
5.0000 mg | ORAL_TABLET | Freq: Every day | ORAL | Status: DC
  Administered 2015-09-23: 5 mg via ORAL
  Filled 2015-09-23 (×2): qty 1

## 2015-09-23 NOTE — Progress Notes (Signed)
D/c teaching complete  We left Baby love nurse left message  For  B/P check up

## 2015-09-23 NOTE — Discharge Instructions (Signed)
Cesarean Delivery, Care After  Refer to this sheet in the next few weeks. These instructions provide you with information on caring for yourself after your procedure. Your health care provider may also give you specific instructions. Your treatment has been planned according to current medical practices, but problems sometimes occur. Call your health care provider if you have any problems or questions after you go home.  HOME CARE INSTRUCTIONS   Only take over-the-counter or prescription medications as directed by your health care provider.   Do not drink alcohol, especially if you are breastfeeding or taking medication to relieve pain.   Do not chew or smoke tobacco.   Continue to use good perineal care. Good perineal care includes:    Wiping your perineum from front to back.    Keeping your perineum clean.   Check your surgical cut (incision) daily for increased redness, drainage, swelling, or separation of skin.   Clean your incision gently with soap and water every day, and then pat it dry. If your health care provider says it is okay, leave the incision uncovered. Use a bandage (dressing) if the incision is draining fluid or appears irritated. If the adhesive strips across the incision do not fall off within 7 days, carefully peel them off.   Hug a pillow when coughing or sneezing until your incision is healed. This helps to relieve pain.   Do not use tampons or douche until your health care provider says it is okay.   Shower, wash your hair, and take tub baths as directed by your health care provider.   Wear a well-fitting bra that provides breast support.   Limit wearing support panties or control-top hose.   Drink enough fluids to keep your urine clear or pale yellow.   Eat high-fiber foods such as whole grain cereals and breads, brown rice, beans, and fresh fruits and vegetables every day. These foods may help prevent or relieve constipation.   Resume activities such as climbing stairs,  driving, lifting, exercising, or traveling as directed by your health care provider.   Talk to your health care provider about resuming sexual activities. This is dependent upon your risk of infection, your rate of healing, and your comfort and desire to resume sexual activity.   Try to have someone help you with your household activities and your newborn for at least a few days after you leave the hospital.   Rest as much as possible. Try to rest or take a nap when your newborn is sleeping.   Increase your activities gradually.   Keep all of your scheduled postpartum appointments. It is very important to keep your scheduled follow-up appointments. At these appointments, your health care provider will be checking to make sure that you are healing physically and emotionally.  SEEK MEDICAL CARE IF:    You are passing large clots from your vagina. Save any clots to show your health care provider.   You have a foul smelling discharge from your vagina.   You have trouble urinating.   You are urinating frequently.   You have pain when you urinate.   You have a change in your bowel movements.   You have increasing redness, pain, or swelling near your incision.   You have pus draining from your incision.   Your incision is separating.   You have painful, hard, or reddened breasts.   You have a severe headache.   You have blurred vision or see spots.   You feel sad   or depressed.   You have thoughts of hurting yourself or your newborn.   You have questions about your care, the care of your newborn, or medications.   You are dizzy or light-headed.   You have a rash.   You have pain, redness, or swelling at the site of the removed intravenous access (IV) tube.   You have nausea or vomiting.   You stopped breastfeeding and have not had a menstrual period within 12 weeks of stopping.   You are not breastfeeding and have not had a menstrual period within 12 weeks of delivery.   You have a fever.  SEEK  IMMEDIATE MEDICAL CARE IF:   You have persistent pain.   You have chest pain.   You have shortness of breath.   You faint.   You have leg pain.   You have stomach pain.   Your vaginal bleeding saturates 2 or more sanitary pads in 1 hour.  MAKE SURE YOU:    Understand these instructions.   Will watch your condition.   Will get help right away if you are not doing well or get worse.     This information is not intended to replace advice given to you by your health care provider. Make sure you discuss any questions you have with your health care provider.     Document Released: 10/08/2001 Document Revised: 02/06/2014 Document Reviewed: 09/13/2011  Elsevier Interactive Patient Education 2016 [employer]

## 2015-09-23 NOTE — Progress Notes (Signed)
This Nurse has re-educated pt and father of the baby several times this shift to not sleep with the infant in the bed or on the pull out sofa. Despite this infant is routinely  found on a sleeping parent. Each time I have moved the infant into it's bassinet and re-educated the parents.

## 2015-09-23 NOTE — Discharge Summary (Signed)
OB Discharge Summary     Patient Name: Suzanne Bush DOB: [birth year] MRN: [id number]  Date of admission: 09/20/2015 Delivering MD: Levie HeritageSTINSON, JACOB J   Date of discharge: 09/23/2015  Admitting diagnosis: 38w hbp, headache Intrauterine pregnancy: 240w6d     Secondary diagnosis:  Active Problems:   Preeclampsia   Status post cesarean delivery  Additional problems: none     Discharge diagnosis: Term Pregnancy Delivered and Preeclampsia (severe)                                                                                                Post partum procedures:none  Augmentation: none  Complications: None  Hospital course:  Sceduled C/S   30 y.o. yo [id number] at 2040w6d was admitted to the hospital 09/20/2015 for scheduled cesarean section with the following indication:severe pre-eclampsia desires repeat.  Membrane Rupture Time/Date: 11:52 PM ,09/20/2015   Patient delivered a Viable infant.09/20/2015  Details of operation can be found in separate operative note.  Pateint had an uncomplicated postpartum course.  She is ambulating, tolerating a regular diet, passing flatus, and urinating well. Patient is discharged home in stable condition on  09/23/15         Pt did have severe-pre-eclampsia was initially admitted to Ante partum as she had eaten and was not able to have a c-section immediately. She was given IV magnesium. Pt did well post sugery magnesium was d/c'd with a small increased in BP. Pt was started on 5mg  of amlodipine before she was discharged. She was given prescription for baby love nurse and BP check in office in 1 wk.   Physical exam  Vitals:   09/22/15 1956 09/22/15 2150 09/23/15 0129 09/23/15 0522  BP: (!) 147/88 (!) 147/86 (!) 144/87 129/83  Pulse: 84 95 81 81  Resp:  18 16 16   Temp:  98.7 F (37.1 C) 98.4 F (36.9 C) 98.2 F (36.8 C)  TempSrc:  Oral Oral Oral  SpO2:  99% 99% 99%  Weight:    239 lb (108.4 kg)  Height:       General: alert, cooperative and  no distress Lochia: appropriate Uterine Fundus: firm Incision: Dressing is clean, dry, and intact DVT Evaluation: No evidence of DVT seen on physical exam. No significant calf/ankle edema. Labs: Lab Results  Component Value Date   WBC 22.4 (H) 09/21/2015   HGB 10.8 (L) 09/21/2015   HCT 32.1 (L) 09/21/2015   MCV 80.5 09/21/2015   PLT 334 09/21/2015   CMP Latest Ref Rng & Units 09/20/2015  Glucose 65 - 99 mg/dL 93  BUN 6 - 20 mg/dL 11  Creatinine 2.950.44 - 6.211.00 mg/dL 3.080.71  Sodium 657135 - 846145 mmol/L 132(L)  Potassium 3.5 - 5.1 mmol/L 3.8  Chloride 101 - 111 mmol/L 104  CO2 22 - 32 mmol/L 22  Calcium 8.9 - 10.3 mg/dL 9.0  Total Protein 6.5 - 8.1 g/dL 6.7  Total Bilirubin 0.3 - 1.2 mg/dL 0.7  Alkaline Phos 38 - 126 U/L 183(H)  AST 15 - 41 U/L 16  ALT 14 - 54 U/L 11(L)    Discharge  instruction: per After Visit Summary and "Baby and Me Booklet".  After visit meds:    Medication List    TAKE these medications   amLODipine 5 MG tablet Commonly known as:  NORVASC Take 1 tablet (5 mg total) by mouth daily.   docusate sodium 100 MG capsule Commonly known as:  COLACE Take 1 capsule (100 mg total) by mouth 2 (two) times daily.   ferrous sulfate 325 (65 FE) MG tablet Take 325 mg by mouth daily with breakfast.   ibuprofen 600 MG tablet Commonly known as:  ADVIL,MOTRIN Take 1 tablet (600 mg total) by mouth every 6 (six) hours.   oxyCODONE-acetaminophen 5-325 MG tablet Commonly known as:  PERCOCET/ROXICET Take 1 tablet by mouth every 4 (four) hours as needed (pain scale 4-7).   prenatal multivitamin Tabs tablet Take 1 tablet by mouth daily at 12 noon.       Diet: routine diet  Activity: Advance as tolerated. Pelvic rest for 6 weeks.   Outpatient follow up:1 wk for BP check Follow up Appt:No future appointments. Follow up Visit:No Follow-up on file.  Postpartum contraception: IUD Mirena  Newborn Data: Live born female  Birth Weight: 7 lb 5.5 oz (3330 g) APGAR: 8,  9  Baby Feeding: Breast Disposition:home with mother   09/23/2015 Ernestina PennaNicholas Schenk, MD

## 2015-09-23 NOTE — Lactation Note (Signed)
This note was copied from a baby's chart. Lactation Consultation Note  Patient Name: Boy Clois Dupesatasha Negash ZOXWR'UToday's Date: 09/23/2015  Follow up visit made prior to discharge.  Mom states baby is nursing well. She is anxious for milk to come in.  Reviewed milk coming to volume.  Baby is at a 8 % weight loss. No questions or concerns at present.  Outpatient lactation services and support information given and encouraged prn.   Maternal Data    Feeding    LATCH Score/Interventions                      Lactation Tools Discussed/Used     Consult Status      Huston FoleyMOULDEN, Ellianah Cordy S 09/23/2015, 11:20 AM

## 2015-10-05 ENCOUNTER — Telehealth: Payer: Self-pay | Admitting: *Deleted

## 2015-10-05 NOTE — Telephone Encounter (Signed)
Patient is calling about her BP medication and her pain medication. 5:55 Call to patient- she states she missed her appointment for her BP check- but she has an appointment tomorrow at 10:00. Told her we address her needs at that appointment. (She seemed sleepy and she didn't want to talk.)

## 2015-10-06 ENCOUNTER — Ambulatory Visit: Payer: Medicaid Other

## 2015-10-14 ENCOUNTER — Ambulatory Visit (INDEPENDENT_AMBULATORY_CARE_PROVIDER_SITE_OTHER): Payer: Medicaid Other | Admitting: Obstetrics & Gynecology

## 2015-10-14 ENCOUNTER — Encounter: Payer: Self-pay | Admitting: Obstetrics & Gynecology

## 2015-10-14 MED ORDER — CYCLOBENZAPRINE HCL 10 MG PO TABS: 10.0000 mg | ORAL_TABLET | Freq: Three times a day (TID) | ORAL | 2 refills | Status: DC | PRN

## 2015-10-14 MED ORDER — IBUPROFEN 600 MG PO TABS: 600.0000 mg | ORAL_TABLET | Freq: Four times a day (QID) | ORAL | 1 refills | Status: DC | PRN

## 2015-10-14 NOTE — Progress Notes (Signed)
Subjective:drainage from incision and mid back pain     Suzanne Bush is a 30 y.o. female who presents for a postpartum visit. She is 3 weeks postpartum following a low cervical transverse Cesarean section. I have fully reviewed the prenatal and intrapartum course. The delivery was at 38.4 gestational weeks. Outcome: repeat cesarean section, low transverse incision. Anesthesia: spinal. Postpartum course has been complicated by back pain and slight drainage on left of incision. Baby's course has been normal . Bleeding no bleeding. Bowel function is normal. Bladder function is normal. Patient is not sexually active. Contraception method is abstinence. Postpartum depression screening:  The following portions of the patient's history were reviewed and updated as appropriate: allergies, current medications, past family history, past medical history, past social history, past surgical history and problem list.  Review of Systems Pertinent items are noted in HPI.   Objective:    BP (!) 129/92   Pulse 61   Temp 98.1 F (36.7 C) (Oral)   Wt 216 lb 11.2 oz (98.3 kg)   LMP 12/25/2014   Breastfeeding? Yes   BMI 36.06 kg/m   General:  alert, cooperative and no distress           Abdomen: soft, non-tender; bowel sounds normal; no masses,  no organomegaly and incision intact with scant drainage on left side   Vulva:  not evaluated  Vagina: not evaluated                   Back not tender no deformity Assessment:     3 week postpartum exam. Ms back pain and serous drainage from intact incision  Plan:    2. Flexeril, ibuprofen 3. Follow up in: 3 weeks or as needed.    Patient ID: Suzanne Bush, female   DOB: 02-Aug-1985, 30 y.o.   MRN: [id number] Adam PhenixJames G Arnelle Nale, MD 10/14/2015

## 2015-10-25 ENCOUNTER — Telehealth: Payer: Self-pay

## 2015-10-25 NOTE — Telephone Encounter (Signed)
Patient called in stating that her RX for muscle spasms is not working for the pain and would like something else. RX prescribed on 10-14-2015.

## 2015-11-03 NOTE — Telephone Encounter (Signed)
Does the patient have f/u pp appt? She could also see her PCP for this

## 2015-11-04 ENCOUNTER — Ambulatory Visit (INDEPENDENT_AMBULATORY_CARE_PROVIDER_SITE_OTHER): Payer: Medicaid Other | Admitting: Obstetrics and Gynecology

## 2015-11-04 ENCOUNTER — Encounter: Payer: Self-pay | Admitting: Obstetrics and Gynecology

## 2015-11-04 NOTE — Progress Notes (Signed)
Subjective:     Suzanne Bush is a 30 y.o. female who presents for a postpartum visit. She is 6 weeks postpartum following a low cervical transverse Cesarean section. I have fully reviewed the prenatal and intrapartum course. The delivery was at 38.4 gestational weeks. Outcome: repeat cesarean section, low transverse incision secondary to severe preeclampsia. Anesthesia: spinal. Postpartum course has been uncomplicated. Baby's course has been uncomplicated. Baby is feeding by both breast and bottle - Similac Advance. Bleeding no bleeding. Bowel function is normal. Bladder function is normal. Patient is sexually active. Contraception method is none. Postpartum depression screening: negative. Patient has been experiencing back pain at spinal site. She has never taken Norvasc since discharge     Review of Systems Pertinent items are noted in HPI.   Objective:    BP 123/83   Pulse 75   Temp 98.1 F (36.7 C) (Oral)   Wt 227 lb 6.4 oz (103.1 kg)   LMP 12/25/2014   BMI 37.84 kg/m   General:  alert, cooperative and no distress   Breasts:  inspection negative, no nipple discharge or bleeding, no masses or nodularity palpable  Lungs: clear to auscultation bilaterally  Heart:  regular rate and rhythm  Abdomen: soft, non-tender; bowel sounds normal; no masses,  no organomegaly incision: healed. No erythema, induration or drainage   Vulva:  normal  Vagina: normal vagina, no discharge, exudate, lesion, or erythema  Cervix:  multiparous appearance  Corpus: normal size, contour, position, consistency, mobility, non-tender  Adnexa:  normal adnexa and no mass, fullness, tenderness  Rectal Exam: Not performed.        Assessment:     Normal postpartum exam. Pap smear not done at today's visit.   Plan:    1. Contraception: abstinence and condoms Patient is interested in Nexplanon vs IUD. She has had irregular bleeding with the Nexplanon previously and thus is undecided. She had unprotected  intercourse this past week. Advised to abstain or use condoms and return for LARC in 2 weeks 2. Patient is medically cleared to resume all activities of daily living. Advised regular physical activity and stretching exercises to help with back pain. Normal BP today. 3. Follow up in: 6 months for annual exam or as needed for contraception.

## 2015-11-04 NOTE — Patient Instructions (Signed)
Levonorgestrel intrauterine device (IUD) What is this medicine? LEVONORGESTREL IUD (LEE voe nor jes trel) is a contraceptive (birth control) device. The device is placed inside the uterus by a healthcare professional. It is used to prevent pregnancy and can also be used to treat heavy bleeding that occurs during your period. Depending on the device, it can be used for 3 to 5 years. This medicine may be used for other purposes; ask your health care provider or pharmacist if you have questions. What should I tell my health care provider before I take this medicine? They need to know if you have any of these conditions: -abnormal Pap smear -cancer of the breast, uterus, or cervix -diabetes -endometritis -genital or pelvic infection now or in the past -have more than one sexual partner or your partner has more than one partner -heart disease -history of an ectopic or tubal pregnancy -immune system problems -IUD in place -liver disease or tumor -problems with blood clots or take blood-thinners -use intravenous drugs -uterus of unusual shape -vaginal bleeding that has not been explained -an unusual or allergic reaction to levonorgestrel, other hormones, silicone, or polyethylene, medicines, foods, dyes, or preservatives -pregnant or trying to get pregnant -breast-feeding How should I use this medicine? This device is placed inside the uterus by a health care professional. Talk to your pediatrician regarding the use of this medicine in children. Special care may be needed. Overdosage: If you think you have taken too much of this medicine contact a poison control center or emergency room at once. NOTE: This medicine is only for you. Do not share this medicine with others. What if I miss a dose? This does not apply. What may interact with this medicine? Do not take this medicine with any of the following medications: -amprenavir -bosentan -fosamprenavir This medicine may also interact with  the following medications: -aprepitant -barbiturate medicines for inducing sleep or treating seizures -bexarotene -griseofulvin -medicines to treat seizures like carbamazepine, ethotoin, felbamate, oxcarbazepine, phenytoin, topiramate -modafinil -pioglitazone -rifabutin -rifampin -rifapentine -some medicines to treat HIV infection like atazanavir, indinavir, lopinavir, nelfinavir, tipranavir, ritonavir -St. John's wort -warfarin This list may not describe all possible interactions. Give your health care provider a list of all the medicines, herbs, non-prescription drugs, or dietary supplements you use. Also tell them if you smoke, drink alcohol, or use illegal drugs. Some items may interact with your medicine. What should I watch for while using this medicine? Visit your doctor or health care professional for regular check ups. See your doctor if you or your partner has sexual contact with others, becomes HIV positive, or gets a sexual transmitted disease. This product does not protect you against HIV infection (AIDS) or other sexually transmitted diseases. You can check the placement of the IUD yourself by reaching up to the top of your vagina with clean fingers to feel the threads. Do not pull on the threads. It is a good habit to check placement after each menstrual period. Call your doctor right away if you feel more of the IUD than just the threads or if you cannot feel the threads at all. The IUD may come out by itself. You may become pregnant if the device comes out. If you notice that the IUD has come out use a backup birth control method like condoms and call your health care provider. Using tampons will not change the position of the IUD and are okay to use during your period. What side effects may I notice from receiving this medicine?   Side effects that you should report to your doctor or health care professional as soon as possible: -allergic reactions like skin rash, itching or  hives, swelling of the face, lips, or tongue -fever, flu-like symptoms -genital sores -high blood pressure -no menstrual period for 6 weeks during use -pain, swelling, warmth in the leg -pelvic pain or tenderness -severe or sudden headache -signs of pregnancy -stomach cramping -sudden shortness of breath -trouble with balance, talking, or walking -unusual vaginal bleeding, discharge -yellowing of the eyes or skin Side effects that usually do not require medical attention (report to your doctor or health care professional if they continue or are bothersome): -acne -breast pain -change in sex drive or performance -changes in weight -cramping, dizziness, or faintness while the device is being inserted -headache -irregular menstrual bleeding within first 3 to 6 months of use -nausea This list may not describe all possible side effects. Call your doctor for medical advice about side effects. You may report side effects to FDA at 1-800-FDA-1088. Where should I keep my medicine? This does not apply. NOTE: This sheet is a summary. It may not cover all possible information. If you have questions about this medicine, talk to your doctor, pharmacist, or health care provider.    2016, Elsevier/Gold Standard. (2011-02-16 13:54:04)  

## 2015-11-04 NOTE — Progress Notes (Signed)
Patient is in office for 6 week pp check following c-section delivery. Patient stated she is breast and bottle feeding, but still having back pain despite taking Flexeril. Patient stated she was sexually active a week ago without protection, and she is unsure about birth control options.

## 2015-11-24 ENCOUNTER — Emergency Department (HOSPITAL_COMMUNITY)
Admission: EM | Admit: 2015-11-24 | Discharge: 2015-11-24 | Disposition: A | Discharge status: Home / Self Care | Payer: Medicaid Other | Attending: Emergency Medicine | Admitting: Emergency Medicine

## 2015-11-24 ENCOUNTER — Emergency Department (HOSPITAL_COMMUNITY): Payer: Medicaid Other

## 2015-11-24 ENCOUNTER — Encounter (HOSPITAL_COMMUNITY): Payer: Self-pay

## 2015-11-24 DIAGNOSIS — S63693A Other sprain of left middle finger, initial encounter: Secondary | ICD-10-CM | POA: Insufficient documentation

## 2015-11-24 DIAGNOSIS — Y999 Unspecified external cause status: Secondary | ICD-10-CM | POA: Insufficient documentation

## 2015-11-24 DIAGNOSIS — X501XXA Overexertion from prolonged static or awkward postures, initial encounter: Secondary | ICD-10-CM | POA: Insufficient documentation

## 2015-11-24 DIAGNOSIS — F17210 Nicotine dependence, cigarettes, uncomplicated: Secondary | ICD-10-CM | POA: Diagnosis not present

## 2015-11-24 DIAGNOSIS — Y939 Activity, unspecified: Secondary | ICD-10-CM | POA: Diagnosis not present

## 2015-11-24 DIAGNOSIS — S6992XA Unspecified injury of left wrist, hand and finger(s), initial encounter: Secondary | ICD-10-CM | POA: Diagnosis present

## 2015-11-24 DIAGNOSIS — Y929 Unspecified place or not applicable: Secondary | ICD-10-CM | POA: Insufficient documentation

## 2015-11-24 NOTE — Discharge Instructions (Signed)
Ice and elevate finger. Ibuprofen or tylenol for pain. Splint for immobilization for 2 weeks. Follow up with family doctor as needed.

## 2015-11-24 NOTE — ED Triage Notes (Signed)
Patient here with lef hand middle finger pain x 1 week after bending same backwards. Swelling and pain with any ROM

## 2015-11-24 NOTE — ED Notes (Signed)
Pt is in stable condition upon d/c and ambulates from ED. [id number]

## 2015-11-24 NOTE — ED Provider Notes (Signed)
[facility]-EMERGENCY DEPT Provider Note   CSN: [id number] Arrival date & time: 11/24/15  1151  By signing my name below, I, Suzanne Bush, attest that this documentation has been prepared under the direction and in the presence of Dixie Jafri, PA-C. Electronically Signed: Sonum Bush, Neurosurgeon. 11/24/15. 1:35 PM.  History   Chief Complaint No chief complaint on file.   The history is provided by the patient. No language interpreter was used.     HPI Comments: Suzanne Bush is a 30 y.o. female who presents to the Emergency Department complaining of constant left middle and ring finger pain for the past 1 week. Patient states the affected fingers were hyperextended causing the injury. She has associated swelling to both fingers but the middle finger is worse. She has applied heat and ice without relief. She has taken ibuprofen without relief. She denies numbness, paresthesias, hand pain, wrist pain.    Past Medical History:  Diagnosis Date  . Medical history non-contributory     Patient Active Problem List   Diagnosis Date Noted  . Status post cesarean delivery 09/21/2015  . Preeclampsia 09/20/2015  . Gestational hypertension w/o significant proteinuria in 3rd trimester 09/13/2015  . Supervision of normal pregnancy in third trimester 08/26/2015  . Obesity affecting pregnancy in third trimester 08/26/2015  . BMI 40.0-44.9, adult (HCC) 08/26/2015  . History of cesarean delivery 08/26/2015    Past Surgical History:  Procedure Laterality Date  . CESAREAN SECTION    . CESAREAN SECTION N/A 09/20/2015   Procedure: CESAREAN SECTION;  Surgeon: Levie Heritage, DO;  Location: [facility] BIRTHING SUITES;  Service: Obstetrics;  Laterality: N/A;  . CHOLECYSTECTOMY  after baby was born    OB History    Gravida Para Term Preterm AB Living   4 2 2  0 2 2   SAB TAB Ectopic Multiple Live Births   2 0 0 0 2      Obstetric Comments   05/2009: pLTCS for NRFHT. 7lbs 2oz. 1 layer chromic closure         Home Medications    Prior to Admission medications   Medication Sig Start Date End Date Taking? Authorizing Provider  amLODipine (NORVASC) 5 MG tablet Take 1 tablet (5 mg total) by mouth daily. Patient not taking: Reported on 11/04/2015 09/23/15   Lorne Skeens, MD  cyclobenzaprine (FLEXERIL) 10 MG tablet Take 1 tablet (10 mg total) by mouth 3 (three) times daily as needed for muscle spasms. 10/14/15   Adam Phenix, MD  docusate sodium (COLACE) 100 MG capsule Take 1 capsule (100 mg total) by mouth 2 (two) times daily. Patient not taking: Reported on 11/04/2015 09/23/15   Lorne Skeens, MD  ferrous sulfate 325 (65 FE) MG tablet Take 325 mg by mouth daily with breakfast.    Historical Provider, MD  ibuprofen (ADVIL,MOTRIN) 600 MG tablet Take 1 tablet (600 mg total) by mouth every 6 (six) hours. Patient not taking: Reported on 11/04/2015 09/23/15   Lorne Skeens, MD  ibuprofen (ADVIL,MOTRIN) 600 MG tablet Take 1 tablet (600 mg total) by mouth every 6 (six) hours as needed. 10/14/15   Adam Phenix, MD  oxyCODONE-acetaminophen (PERCOCET/ROXICET) 5-325 MG tablet Take 1 tablet by mouth every 4 (four) hours as needed (pain scale 4-7). Patient not taking: Reported on 11/04/2015 09/23/15   Lorne Skeens, MD  Prenatal Vit-Fe Fumarate-FA (PRENATAL MULTIVITAMIN) TABS tablet Take 1 tablet by mouth daily at 12 noon.    Historical Provider, MD  Family History No family history on file.  Social History Social History  Substance Use Topics  . Smoking status: Former Smoker    Packs/day: 0.25    Types: Cigarettes  . Smokeless tobacco: Never Used  . Alcohol use No     Allergies   Review of patient's allergies indicates no known allergies.   Review of Systems Review of Systems  Musculoskeletal: Positive for arthralgias and joint swelling.  Neurological: Negative for weakness and numbness.     Physical Exam Updated Vital Signs BP 140/98 (BP Location:  Right Arm)   Pulse 72   Temp 97.8 F (36.6 C) (Oral)   Resp 17   LMP 12/25/2014   SpO2 97%   Physical Exam  Constitutional: She is oriented to person, place, and time. She appears well-developed and well-nourished.  HENT:  Head: Normocephalic and atraumatic.  Cardiovascular: Normal rate.   Pulmonary/Chest: Effort normal.  Musculoskeletal:  Swelling noted to the left middle finger, tenderness to palpation over PIP and DIP joints. Patient is able to fully flex the finger every joint. Patient is able fully extend the finger at every joint. Strength intact with flexion and extension against resistance. Normal rest of the exam.   Neurological: She is alert and oriented to person, place, and time.  Skin: Skin is warm and dry.  Psychiatric: She has a normal mood and affect.  Nursing note and vitals reviewed.    ED Treatments / Results  DIAGNOSTIC STUDIES: Oxygen Saturation is 97% on RA, adequate by my interpretation.    COORDINATION OF CARE: 1:35 PM Discussed treatment plan with pt at bedside and pt agreed to plan.   Labs (all labs ordered are listed, but only abnormal results are displayed) Labs Reviewed - No data to display  EKG  EKG Interpretation None       Radiology No results found.  Procedures Procedures (including critical care time)  Medications Ordered in ED Medications - No data to display   Initial Impression / Assessment and Plan / ED Course  I have reviewed the triage vital signs and the nursing notes.  Pertinent labs & imaging results that were available during my care of the patient were reviewed by me and considered in my medical decision making (see chart for details).  Clinical Course    Pt in ED with left middle finger pain and swelling After injuring it 2 weeks ago. Continues to have some swelling and pain. Full range of motion of the finger actively and passively. Strength is intact. Cap refill is 2 seconds distally. Will place in a splint.  Most likely a sprain. Will have her follow-up with a primary care doctor. Ice, NSAIDs, follow-up as needed.  Vitals:   11/24/15 1154 11/24/15 1438  BP: 140/98 144/91  Pulse: 72 (!) 56  Resp: 17 16  Temp: 97.8 F (36.6 C)   TempSrc: Oral   SpO2: 97% 100%     Final Clinical Impressions(s) / ED Diagnoses   Final diagnoses:  Other sprain of left middle finger, initial encounter    New Prescriptions Discharge Medication List as of 11/24/2015  2:31 PM      I personally performed the services described in this documentation, which was scribed in my presence. The recorded information has been reviewed and is accurate.    Jaynie Crumbleatyana Fernando Stoiber, PA-C 11/24/15 1459    Benjiman CoreNathan Pickering, MD 11/24/15 [phone]

## 2016-03-01 ENCOUNTER — Inpatient Hospital Stay (HOSPITAL_COMMUNITY)
Admission: AD | Admit: 2016-03-01 | Discharge: 2016-03-01 | Disposition: A | Discharge status: Home / Self Care | Payer: Medicaid Other | Source: Ambulatory Visit | Attending: Family Medicine | Admitting: Family Medicine

## 2016-03-01 ENCOUNTER — Encounter (HOSPITAL_COMMUNITY): Payer: Self-pay | Admitting: *Deleted

## 2016-03-01 ENCOUNTER — Inpatient Hospital Stay (HOSPITAL_COMMUNITY): Payer: Medicaid Other

## 2016-03-01 DIAGNOSIS — O34211 Maternal care for low transverse scar from previous cesarean delivery: Secondary | ICD-10-CM | POA: Insufficient documentation

## 2016-03-01 DIAGNOSIS — Z87891 Personal history of nicotine dependence: Secondary | ICD-10-CM | POA: Diagnosis not present

## 2016-03-01 DIAGNOSIS — O26891 Other specified pregnancy related conditions, first trimester: Secondary | ICD-10-CM | POA: Insufficient documentation

## 2016-03-01 DIAGNOSIS — R109 Unspecified abdominal pain: Secondary | ICD-10-CM

## 2016-03-01 DIAGNOSIS — Z3A01 Less than 8 weeks gestation of pregnancy: Secondary | ICD-10-CM | POA: Diagnosis not present

## 2016-03-01 DIAGNOSIS — O26899 Other specified pregnancy related conditions, unspecified trimester: Secondary | ICD-10-CM

## 2016-03-01 DIAGNOSIS — R1030 Lower abdominal pain, unspecified: Secondary | ICD-10-CM | POA: Diagnosis present

## 2016-03-01 HISTORY — DX: Essential (primary) hypertension: I10

## 2016-03-01 LAB — URINALYSIS, ROUTINE W REFLEX MICROSCOPIC
BILIRUBIN URINE: NEGATIVE
Glucose, UA: NEGATIVE mg/dL
Hgb urine dipstick: NEGATIVE
KETONES UR: NEGATIVE mg/dL
Leukocytes, UA: NEGATIVE
NITRITE: NEGATIVE
PH: 5 (ref 5.0–8.0)
Protein, ur: NEGATIVE mg/dL
Specific Gravity, Urine: 1.026 (ref 1.005–1.030)

## 2016-03-01 LAB — CBC
HCT: 36 % (ref 36.0–46.0)
Hemoglobin: 11.5 g/dL — ABNORMAL LOW (ref 12.0–15.0)
MCH: 25.4 pg — AB (ref 26.0–34.0)
MCHC: 31.9 g/dL (ref 30.0–36.0)
MCV: 79.6 fL (ref 78.0–100.0)
PLATELETS: 314 10*3/uL (ref 150–400)
RBC: 4.52 MIL/uL (ref 3.87–5.11)
RDW: 16.1 % — AB (ref 11.5–15.5)
WBC: 10.5 10*3/uL (ref 4.0–10.5)

## 2016-03-01 LAB — HCG, QUANTITATIVE, PREGNANCY: hCG, Beta Chain, Quant, S: 2673 m[IU]/mL — ABNORMAL HIGH (ref <5)

## 2016-03-01 LAB — WET PREP, GENITAL
Clue Cells Wet Prep HPF POC: NONE SEEN
Sperm: NONE SEEN
TRICH WET PREP: NONE SEEN
WBC, Wet Prep HPF POC: NONE SEEN
Yeast Wet Prep HPF POC: NONE SEEN

## 2016-03-01 LAB — POCT PREGNANCY, URINE: Preg Test, Ur: POSITIVE — AB

## 2016-03-01 NOTE — Discharge Instructions (Signed)
Abdominal Pain During Pregnancy  Abdominal pain is common in pregnancy. Most of the time, it does not cause harm. There are many causes of abdominal pain. Some causes are more serious than others and sometimes the cause is not known. Abdominal pain can be a sign that something is very wrong with the pregnancy or the pain may have nothing to do with the pregnancy. Always tell your health care provider if you have any abdominal pain.  Follow these instructions at home:  · Do not have sex or put anything in your vagina until your symptoms go away completely.  · Watch your abdominal pain for any changes.  · Get plenty of rest until your pain improves.  · Drink enough fluid to keep your urine clear or pale yellow.  · Take over-the-counter or prescription medicines only as told by your health care provider.  · Keep all follow-up visits as told by your health care provider. This is important.  Contact a health care provider if:  · You have a fever.  · Your pain gets worse or you have cramping.  · Your pain continues after resting.  Get help right away if:  · You are bleeding, leaking fluid, or passing tissue from the vagina.  · You have vomiting or diarrhea that does not go away.  · You have painful or bloody urination.  · You notice a decrease in your baby's movements.  · You feel very weak or faint.  · You have shortness of breath.  · You develop a severe headache with abdominal pain.  · You have abnormal vaginal discharge with abdominal pain.  This information is not intended to replace advice given to you by your health care provider. Make sure you discuss any questions you have with your health care provider.  Document Released: 01/16/2005 Document Revised: 10/28/2015 Document Reviewed: 08/15/2012  Elsevier Interactive Patient Education © 2017 [employer]

## 2016-03-01 NOTE — MAU Note (Signed)
Been having back pains and stomach pain for over a wk, comes and goes.  Like gas pains.

## 2016-03-01 NOTE — MAU Provider Note (Signed)
History     CSN: [id number]  Arrival date and time: 03/01/16 1125   None     Chief Complaint  Patient presents with  . Abdominal Pain  . Back Pain   HPI   [name] is a 31 y.o. female [phone] @ 1541w6d here in MAU with lower abdominal cramping that started a few weeks ago. She thought it was her period starting however she missed her period. She suspected she was pregnant. She denies pain at this time in her abdomen, however her back is hurting. The back pain is located in the lower-middle part of her abdomen.   OB History    Gravida Para Term Preterm AB Living   5 2 2  0 2 2   SAB TAB Ectopic Multiple Live Births   2 0 0 0 2      Obstetric Comments   05/2009: pLTCS for NRFHT. 7lbs 2oz. 1 layer chromic closure      Past Medical History:  Diagnosis Date  . Hypertension   . Medical history non-contributory     Past Surgical History:  Procedure Laterality Date  . CESAREAN SECTION    . CESAREAN SECTION N/A 09/20/2015   Procedure: CESAREAN SECTION;  Surgeon: Levie HeritageJacob J Stinson, DO;  [facility];  Service: Obstetrics;  Laterality: N/A;  . CHOLECYSTECTOMY  after baby was born    No family history on file.  Social History  Substance Use Topics  . Smoking status: Former Smoker    Packs/day: 0.25    Types: Cigarettes  . Smokeless tobacco: Never Used  . Alcohol use No    Allergies: No Known Allergies  Prescriptions Prior to Admission  Medication Sig Dispense Refill Last Dose  . amLODipine (NORVASC) 5 MG tablet Take 1 tablet (5 mg total) by mouth daily. (Patient not taking: Reported on 11/04/2015) 30 tablet 1 Not Taking  . cyclobenzaprine (FLEXERIL) 10 MG tablet Take 1 tablet (10 mg total) by mouth 3 (three) times daily as needed for muscle spasms. 30 tablet 2 Taking  . docusate sodium (COLACE) 100 MG capsule Take 1 capsule (100 mg total) by mouth 2 (two) times daily. (Patient not taking: Reported on 11/04/2015) 30 capsule 0 Not Taking  . ferrous  sulfate 325 (65 FE) MG tablet Take 325 mg by mouth daily with breakfast.   Not Taking  . ibuprofen (ADVIL,MOTRIN) 600 MG tablet Take 1 tablet (600 mg total) by mouth every 6 (six) hours. (Patient not taking: Reported on 11/04/2015) 30 tablet 0 Not Taking  . ibuprofen (ADVIL,MOTRIN) 600 MG tablet Take 1 tablet (600 mg total) by mouth every 6 (six) hours as needed. 30 tablet 1 Taking  . oxyCODONE-acetaminophen (PERCOCET/ROXICET) 5-325 MG tablet Take 1 tablet by mouth every 4 (four) hours as needed (pain scale 4-7). (Patient not taking: Reported on 11/04/2015) 20 tablet 0 Not Taking  . Prenatal Vit-Fe Fumarate-FA (PRENATAL MULTIVITAMIN) TABS tablet Take 1 tablet by mouth daily at 12 noon.   Not Taking   Results for orders placed or performed during the hospital encounter of 03/01/16 (from the past 48 hour(s))  Urinalysis, Routine w reflex microscopic     Status: Abnormal   Collection Time: 03/01/16 11:40 AM  Result Value Ref Range   Color, Urine YELLOW YELLOW   APPearance HAZY (A) CLEAR   Specific Gravity, Urine 1.026 1.005 - 1.030   pH 5.0 5.0 - 8.0   Glucose, UA NEGATIVE NEGATIVE mg/dL   Hgb urine dipstick NEGATIVE NEGATIVE  Bilirubin Urine NEGATIVE NEGATIVE   Ketones, ur NEGATIVE NEGATIVE mg/dL   Protein, ur NEGATIVE NEGATIVE mg/dL   Nitrite NEGATIVE NEGATIVE   Leukocytes, UA NEGATIVE NEGATIVE  Pregnancy, urine POC     Status: Abnormal   Collection Time: 03/01/16 11:56 AM  Result Value Ref Range   Preg Test, Ur POSITIVE (A) NEGATIVE    Comment:        THE SENSITIVITY OF THIS METHODOLOGY IS >24 mIU/mL   CBC     Status: Abnormal   Collection Time: 03/01/16 12:41 PM  Result Value Ref Range   WBC 10.5 4.0 - 10.5 K/uL   RBC 4.52 3.87 - 5.11 MIL/uL   Hemoglobin 11.5 (L) 12.0 - 15.0 g/dL   HCT 16.1 09.6 - 04.5 %   MCV 79.6 78.0 - 100.0 fL   MCH 25.4 (L) 26.0 - 34.0 pg   MCHC 31.9 30.0 - 36.0 g/dL   RDW 40.9 (H) 81.1 - 91.4 %   Platelets 314 150 - 400 K/uL  hCG, quantitative,  pregnancy     Status: Abnormal   Collection Time: 03/01/16 12:41 PM  Result Value Ref Range   hCG, Beta Chain, Quant, S 2,673 (H) <5 mIU/mL    Comment:          GEST. AGE      CONC.  (mIU/mL)   <=1 WEEK        5 - 50     2 WEEKS       50 - 500     3 WEEKS       100 - 10,000     4 WEEKS     1,000 - 30,000     5 WEEKS     3,500 - 115,000   6-8 WEEKS     12,000 - 270,000    12 WEEKS     15,000 - 220,000        FEMALE AND NON-PREGNANT FEMALE:     LESS THAN 5 mIU/mL   Wet prep, genital     Status: None   Collection Time: 03/01/16  2:13 PM  Result Value Ref Range   Yeast Wet Prep HPF POC NONE SEEN NONE SEEN   Trich, Wet Prep NONE SEEN NONE SEEN   Clue Cells Wet Prep HPF POC NONE SEEN NONE SEEN   WBC, Wet Prep HPF POC NONE SEEN NONE SEEN    Comment: FEW BACTERIA SEEN   Sperm NONE SEEN     US Ob Comp Less 14 Wks  Result Date: 03/01/2016 CLINICAL DATA:  31 year old pregnant female presents with pelvic pain. Quantitative beta HCG 2,673. EDC by LMP: 10/26/2016, projecting to an expected gestational age of [redacted] weeks 6 days. EXAM: OBSTETRIC <14 WK [place] AND TRANSVAGINAL OB US TECHNIQUE: Both transabdominal and transvaginal ultrasound examinations were performed for complete evaluation of the gestation as well as the maternal uterus, adnexal regions, and pelvic cul-de-sac. Transvaginal technique was performed to assess early pregnancy. COMPARISON:  No prior scans from this gestation. FINDINGS: Intrauterine gestational sac: Single intrauterine gestational sac appears normal in shape and position. Yolk sac:  Visualized. Embryo:  Not Visualized. Embryonic Cardiac Activity: Not Visualized. MSD: 5.4  mm   5 w   2  d Subchorionic hemorrhage:  None visualized. Maternal uterus/adnexae: Right ovary measures 3.6 x 1.4 x 2.4 cm. Left ovary measures 2.7 x 2.6 x 3.5 cm and contains a corpus luteum. No suspicious ovarian or adnexal masses. No abnormal free fluid in the pelvis. No  uterine fibroids are demonstrated.  IMPRESSION: 1. Single intrauterine gestational sac with yolk sac, at 5 weeks 2 days by mean sac diameter. No embryo visualized, which could be due to early gestational age. A follow-up obstetric scan is recommended in 11-14 days. This recommendation follows SRU consensus guidelines: Diagnostic Criteria for Nonviable Pregnancy Early in the First Trimester. Malva Limes Med 2013; 161:0960-45. 2. No ovarian or adnexal abnormality. Electronically Signed   By: Delbert Phenix M.D.   On: 03/01/2016 14:22   US Ob Transvaginal  Result Date: 03/01/2016 CLINICAL DATA:  32 year old pregnant female presents with pelvic pain. Quantitative beta HCG 2,673. EDC by LMP: 10/26/2016, projecting to an expected gestational age of [redacted] weeks 6 days. EXAM: OBSTETRIC <14 WK [place] AND TRANSVAGINAL OB US TECHNIQUE: Both transabdominal and transvaginal ultrasound examinations were performed for complete evaluation of the gestation as well as the maternal uterus, adnexal regions, and pelvic cul-de-sac. Transvaginal technique was performed to assess early pregnancy. COMPARISON:  No prior scans from this gestation. FINDINGS: Intrauterine gestational sac: Single intrauterine gestational sac appears normal in shape and position. Yolk sac:  Visualized. Embryo:  Not Visualized. Embryonic Cardiac Activity: Not Visualized. MSD: 5.4  mm   5 w   2  d Subchorionic hemorrhage:  None visualized. Maternal uterus/adnexae: Right ovary measures 3.6 x 1.4 x 2.4 cm. Left ovary measures 2.7 x 2.6 x 3.5 cm and contains a corpus luteum. No suspicious ovarian or adnexal masses. No abnormal free fluid in the pelvis. No uterine fibroids are demonstrated. IMPRESSION: 1. Single intrauterine gestational sac with yolk sac, at 5 weeks 2 days by mean sac diameter. No embryo visualized, which could be due to early gestational age. A follow-up obstetric scan is recommended in 11-14 days. This recommendation follows SRU consensus guidelines: Diagnostic Criteria for Nonviable Pregnancy  Early in the First Trimester. Malva Limes Med 2013; 409:8119-14. 2. No ovarian or adnexal abnormality. Electronically Signed   By: Delbert Phenix M.D.   On: 03/01/2016 14:22    Review of Systems  Constitutional: Negative for chills and fever.  Genitourinary: Negative for vaginal bleeding.   Physical Exam   Blood pressure 132/81, pulse 73, temperature 98.6 F (37 C), temperature source Oral, resp. rate 18, height 5' 3.5" (1.613 m), weight 221 lb 9.6 oz (100.5 kg), last menstrual period 01/20/2016, currently breastfeeding.  Physical Exam  Constitutional: She is oriented to person, place, and time. She appears well-developed and well-nourished. No distress.  HENT:  Head: Normocephalic.  Eyes: Pupils are equal, round, and reactive to light.  Respiratory: Effort normal.  GI: Soft. She exhibits no distension. There is no tenderness. There is no rebound and no guarding.  Genitourinary:  Genitourinary Comments: Wet prep and GC collected without speculum  Bimanual exam: Cervix closed, no CMT  Uterus non tender, normal size Adnexa non tender, no masses bilaterally GC/Chlam, wet prep done Chaperone present for exam.   Musculoskeletal: Normal range of motion.  Neurological: She is alert and oriented to person, place, and time.  Skin: Skin is warm. She is not diaphoretic.  Psychiatric: Her behavior is normal.    MAU Course  Procedures  None  MDM  CBC ABO Hcg  [place]  Assessment and Plan   A:  1. Abdominal pain in pregnancy, antepartum   2. Abdominal pain in pregnancy, antepartum     P:  Discharge home in stable condition Start prenatal care Start prenatal vitamins daily as directed on the bottle Return to MAU if symptoms worsen  Duane Lope, NP 03/01/2016 2:41 PM

## 2016-03-02 LAB — HIV ANTIBODY (ROUTINE TESTING W REFLEX): HIV Screen 4th Generation wRfx: NONREACTIVE

## 2016-03-02 LAB — GC/CHLAMYDIA PROBE AMP (~~LOC~~) NOT AT ARMC
CHLAMYDIA, DNA PROBE: NEGATIVE
NEISSERIA GONORRHEA: NEGATIVE

## 2016-04-05 ENCOUNTER — Encounter (HOSPITAL_COMMUNITY): Payer: Self-pay | Admitting: Student

## 2016-04-05 ENCOUNTER — Inpatient Hospital Stay (HOSPITAL_COMMUNITY): Payer: Medicaid Other

## 2016-04-05 ENCOUNTER — Inpatient Hospital Stay (HOSPITAL_COMMUNITY)
Admission: AD | Admit: 2016-04-05 | Discharge: 2016-04-05 | Disposition: A | Discharge status: Home / Self Care | Payer: Medicaid Other | Source: Ambulatory Visit | Attending: Obstetrics & Gynecology | Admitting: Obstetrics & Gynecology

## 2016-04-05 DIAGNOSIS — O200 Threatened abortion: Secondary | ICD-10-CM | POA: Diagnosis not present

## 2016-04-05 DIAGNOSIS — O23591 Infection of other part of genital tract in pregnancy, first trimester: Secondary | ICD-10-CM

## 2016-04-05 DIAGNOSIS — A5901 Trichomonal vulvovaginitis: Secondary | ICD-10-CM | POA: Diagnosis not present

## 2016-04-05 DIAGNOSIS — O98311 Other infections with a predominantly sexual mode of transmission complicating pregnancy, first trimester: Secondary | ICD-10-CM | POA: Diagnosis not present

## 2016-04-05 DIAGNOSIS — Z87891 Personal history of nicotine dependence: Secondary | ICD-10-CM | POA: Diagnosis not present

## 2016-04-05 DIAGNOSIS — IMO0002 Reserved for concepts with insufficient information to code with codable children: Secondary | ICD-10-CM

## 2016-04-05 DIAGNOSIS — Z3A10 10 weeks gestation of pregnancy: Secondary | ICD-10-CM | POA: Insufficient documentation

## 2016-04-05 DIAGNOSIS — R109 Unspecified abdominal pain: Secondary | ICD-10-CM | POA: Diagnosis present

## 2016-04-05 DIAGNOSIS — O209 Hemorrhage in early pregnancy, unspecified: Secondary | ICD-10-CM

## 2016-04-05 HISTORY — DX: Personal history of other infectious and parasitic diseases: Z86.19

## 2016-04-05 LAB — CBC
HCT: 38.2 % (ref 36.0–46.0)
Hemoglobin: 12.3 g/dL (ref 12.0–15.0)
MCH: 26.6 pg (ref 26.0–34.0)
MCHC: 32.2 g/dL (ref 30.0–36.0)
MCV: 82.7 fL (ref 78.0–100.0)
Platelets: 331 10*3/uL (ref 150–400)
RBC: 4.62 MIL/uL (ref 3.87–5.11)
RDW: 16.7 % — AB (ref 11.5–15.5)
WBC: 11.5 10*3/uL — AB (ref 4.0–10.5)

## 2016-04-05 LAB — URINALYSIS, ROUTINE W REFLEX MICROSCOPIC
Bacteria, UA: NONE SEEN
Glucose, UA: NEGATIVE mg/dL
KETONES UR: 5 mg/dL — AB
Nitrite: NEGATIVE
PH: 5 (ref 5.0–8.0)
Protein, ur: 30 mg/dL — AB
SPECIFIC GRAVITY, URINE: 1.033 — AB (ref 1.005–1.030)

## 2016-04-05 LAB — WET PREP, GENITAL
Sperm: NONE SEEN
Yeast Wet Prep HPF POC: NONE SEEN

## 2016-04-05 MED ORDER — METRONIDAZOLE 500 MG PO TABS
2000.0000 mg | ORAL_TABLET | Freq: Once | ORAL | Status: AC
  Administered 2016-04-05: 2000 mg via ORAL
  Filled 2016-04-05: qty 4

## 2016-04-05 NOTE — Discharge Instructions (Signed)
Pelvic Rest Pelvic rest may be recommended if:  Your placenta is partially or completely covering the opening of your cervix (placenta previa).  There is bleeding between the wall of the uterus and the amniotic sac in the first trimester of pregnancy (subchorionic hemorrhage).  You went into labor too early (preterm labor). Based on your overall health and the health of your baby, your health care provider will decide if pelvic rest is right for you. How do I rest my pelvis? For as long as told by your health care provider:  Do not have sex, sexual stimulation, or an orgasm.  Do not use tampons. Do not douche. Do not put anything in your vagina.  Do not lift anything that is heavier than 10 lb (4.5 kg).  Avoid activities that take a lot of effort (are strenuous).  Avoid any activity in which your pelvic muscles could become strained. When should I seek medical care? Seek medical care if you have:  Cramping pain in your lower abdomen.  Vaginal discharge.  A low, dull backache.  Regular contractions.  Uterine tightening. When should I seek immediate medical care? Seek immediate medical care if:  You have vaginal bleeding and you are pregnant. This information is not intended to replace advice given to you by your health care provider. Make sure you discuss any questions you have with your health care provider. Document Released: 05/13/2010 Document Revised: 06/24/2015 Document Reviewed: 07/20/2014 Elsevier Interactive Patient Education  2017 [employer].  Trichomoniasis Trichomoniasis is an STI (sexually transmitted infection) that can affect both women and men. In women, the outer area of the female genitalia (vulva) and the vagina are affected. In men, the penis is mainly affected, but the prostate and other reproductive organs can also be involved. This condition can be treated with medicine. It often has no symptoms (is asymptomatic), especially in men. What are the  causes? This condition is caused by an organism called Trichomonas vaginalis. Trichomoniasis most often spreads from person to person (is contagious) through sexual contact. What increases the risk? The following factors may make you more likely to develop this condition:  Having unprotected sexual intercourse.  Having sexual intercourse with a partner who has trichomoniasis.  Having multiple sexual partners.  Having had previous trichomoniasis infections or other STIs. What are the signs or symptoms? In women, symptoms of trichomoniasis include:  Abnormal vaginal discharge that is clear, white, gray, or yellow-green and foamy and has an unusual "fishy" odor.  Itching and irritation of the vagina and vulva.  Burning or pain during urination or sexual intercourse.  Genital redness and swelling. In men, symptoms of trichomoniasis include:  Penile discharge that may be foamy or contain pus.  Pain in the penis. This may happen only when urinating.  Itching or irritation inside the penis.  Burning after urination or ejaculation. How is this diagnosed? In women, this condition may be found during a routine Pap test or physical exam. It may be found in men during a routine physical exam. Your health care provider may perform tests to help diagnose this infection, such as:  Urine tests (men and women).  The following in women:  Testing the pH of the vagina.  A vaginal swab test that checks for the Trichomonas vaginalis organism.  Testing vaginal secretions. Your health care provider may test you for other STIs, including HIV (human immunodeficiency virus). How is this treated? This condition is treated with medicine taken by mouth (orally), such as metronidazole or tinidazole  to fight the infection. Your sexual partner(s) may also need to be tested and treated.  If you are a woman and you plan to become pregnant or think you may be pregnant, tell your health care provider right  away. Some medicines that are used to treat the infection should not be taken during pregnancy. Your health care provider may recommend over-the-counter medicines or creams to help relieve itching or irritation. You may be tested for infection again 3 months after treatment. Follow these instructions at home:  Take and use over-the-counter and prescription medicines, including creams, only as told by your health care provider.  Do not have sexual intercourse until one week after you finish your medicine, or until your health care provider approves. Ask your health care provider when you may resume sexual intercourse.  (Women) Do not douche or wear tampons while you have the infection.  Discuss your infection with your sexual partner(s). Make sure that your partner gets tested and treated, if necessary.  Keep all follow-up visits as told by your health care provider. This is important. How is this prevented?  Use condoms every time you have sex. Using condoms correctly and consistently can help protect against STIs.  Avoid having multiple sexual partners.  Talk with your sexual partner about any symptoms that either of you may have, as well as any history of STIs.  Get tested for STIs and STDs (sexually transmitted diseases) before you have sex. Ask your partner to do the same.  Do not have sexual contact if you have symptoms of trichomoniasis or another STI. Contact a health care provider if:  You still have symptoms after you finish your medicine.  You develop pain in your abdomen.  You have pain when you urinate.  You have bleeding after sexual intercourse.  You develop a rash.  You feel nauseous or you vomit.  You plan to become pregnant or think you may be pregnant. Summary  Trichomoniasis is an STI (sexually transmitted infection) that can affect both women and men.  This condition often has no symptoms (is asymptomatic), especially in men.  You should not have  sexual intercourse until one week after you finish your medicine, or until your health care provider approves. Ask your health care provider when you may resume sexual intercourse.  Discuss your infection with your sexual partner. Make sure that your partner gets tested and treated, if necessary. This information is not intended to replace advice given to you by your health care provider. Make sure you discuss any questions you have with your health care provider. Document Released: 07/12/2000 Document Revised: 12/10/2015 Document Reviewed: 12/10/2015 Elsevier Interactive Patient Education  2017 [employer] Threatened Miscarriage A threatened miscarriage occurs when you have vaginal bleeding during your first 20 weeks of pregnancy but the pregnancy has not ended. If you have vaginal bleeding during this time, your health care provider will do tests to make sure you are still pregnant. If the tests show you are still pregnant and the developing baby (fetus) inside your womb (uterus) is still growing, your condition is considered a threatened miscarriage. A threatened miscarriage does not mean your pregnancy will end, but it does increase the risk of losing your pregnancy (complete miscarriage). What are the causes? The cause of a threatened miscarriage is usually not known. If you go on to have a complete miscarriage, the most common cause is an abnormal number of chromosomes in the developing baby. Chromosomes are the structures inside cells that hold all  your genetic material. Some causes of vaginal bleeding that do not result in miscarriage include:  Having sex.  Having an infection.  Normal hormone changes of pregnancy.  Bleeding that occurs when an egg implants in your uterus. What increases the risk? Risk factors for bleeding in early pregnancy include:  Obesity.  Smoking.  Drinking excessive amounts of alcohol or caffeine.  Recreational drug use. What are the signs or  symptoms?  Light vaginal bleeding.  Mild abdominal pain or cramps. How is this diagnosed? If you have bleeding with or without abdominal pain before 20 weeks of pregnancy, your health care provider will do tests to check whether you are still pregnant. One important test involves using sound waves and a computer (ultrasound) to create images of the inside of your uterus. Other tests include an internal exam of your vagina and uterus (pelvic exam) and measurement of your babys heart rate. You may be diagnosed with a threatened miscarriage if:  Ultrasound testing shows you are still pregnant.  Your babys heart rate is strong.  A pelvic exam shows that the opening between your uterus and your vagina (cervix) is closed.  Your heart rate and blood pressure are stable.  Blood tests confirm you are still pregnant. How is this treated? No treatments have been shown to prevent a threatened miscarriage from going on to a complete miscarriage. However, the right home care is important. Follow these instructions at home:  Make sure you keep all your appointments for prenatal care. This is very important.  Get plenty of rest.  Do not have sex or use tampons if you have vaginal bleeding.  Do not douche.  Do not smoke or use recreational drugs.  Do not drink alcohol.  Avoid caffeine. Contact a health care provider if:  You have light vaginal bleeding or spotting while pregnant.  You have abdominal pain or cramping.  You have a fever. Get help right away if:  You have heavy vaginal bleeding.  You have blood clots coming from your vagina.  You have severe low back pain or abdominal cramps.  You have fever, chills, and severe abdominal pain. This information is not intended to replace advice given to you by your health care provider. Make sure you discuss any questions you have with your health care provider. Document Released: 01/16/2005 Document Revised: 06/24/2015 Document  Reviewed: 11/12/2012 Elsevier Interactive Patient Education  2017 [employer].

## 2016-04-05 NOTE — MAU Note (Addendum)
Patient c/o vaginal bleeding that started this am; states it was red in color and noticeable when she wiped. Endorses lower back pain and lower abdominal pain that has continued since being seen in January. Has tried tylenol with no relief yesterday; has not taken any tylenol today.

## 2016-04-05 NOTE — MAU Provider Note (Signed)
History     CSN: [id number]  Arrival date and time: 04/05/16 1044   First Provider Initiated Contact with Patient 04/05/16 1115     Chief Complaint  Patient presents with  . Back Pain  . Abdominal Cramping  . Vaginal Bleeding   HPI Suzanne Bush is a 31 y.o. [id number] at [redacted]w[redacted]d who presents with abdominal cramping & vaginal bleeding. Symptoms began this morning. Reports red spotting on toilet paper; has not had to wear & pad & has not passed clots. Also reports lower abdominal cramping that radiates to her lower back. Rates pain 6/10. Has not treated. Denies n/v/d, constipation, dysuria, vaginal discharge, or recent intercourse.   OB History    Gravida Para Term Preterm AB Living   5 2 2  0 2 2   SAB TAB Ectopic Multiple Live Births   2 0 0 0 2      Obstetric Comments   05/2009: pLTCS for NRFHT. 7lbs 2oz. 1 layer chromic closure      Past Medical History:  Diagnosis Date  . Hx of trichomoniasis   . Hypertension     Past Surgical History:  Procedure Laterality Date  . CESAREAN SECTION  2011  . CESAREAN SECTION N/A 09/20/2015   Procedure: CESAREAN SECTION;  Surgeon: Levie Heritage, DO;  Location: [facility] BIRTHING SUITES;  Service: Obstetrics;  Laterality: N/A;  . CHOLECYSTECTOMY  after baby was born    History reviewed. No pertinent family history.  Social History  Substance Use Topics  . Smoking status: Former Smoker    Packs/day: 0.25    Types: Cigarettes  . Smokeless tobacco: Never Used  . Alcohol use No    Allergies: No Known Allergies  Prescriptions Prior to Admission  Medication Sig Dispense Refill Last Dose  . acetaminophen (TYLENOL) 325 MG tablet Take 650 mg by mouth every 6 (six) hours as needed for mild pain or moderate pain.   04/04/2016 at Unknown time    Review of Systems  Constitutional: Negative for chills and fever.  Gastrointestinal: Positive for abdominal pain. Negative for constipation, diarrhea, nausea and vomiting.  Genitourinary: Positive for  vaginal bleeding. Negative for dysuria and vaginal discharge.  Musculoskeletal: Positive for back pain.   Physical Exam   Blood pressure 118/75, pulse 79, temperature 97.6 F (36.4 C), temperature source Oral, resp. rate 18, height 5\' 5"  (1.651 m), weight 222 lb 1.9 oz (100.8 kg), last menstrual period 01/20/2016, SpO2 100 %, currently breastfeeding.  Physical Exam  Nursing note and vitals reviewed. Constitutional: She is oriented to person, place, and time. She appears well-developed and well-nourished. No distress.  HENT:  Head: Normocephalic and atraumatic.  Eyes: Conjunctivae are normal. Right eye exhibits no discharge. Left eye exhibits no discharge. No scleral icterus.  Neck: Normal range of motion.  Cardiovascular: Normal rate, regular rhythm and normal heart sounds.   No murmur heard. Respiratory: Effort normal and breath sounds normal. No respiratory distress. She has no wheezes.  GI: Soft. Bowel sounds are normal. She exhibits no distension. There is no tenderness. There is no rebound and no guarding.  Genitourinary: Uterus normal. Cervix exhibits no motion tenderness and no friability. No bleeding in the vagina. Vaginal discharge (small amount of thin white discharge) found.  Genitourinary Comments: Cervix closed  Neurological: She is alert and oriented to person, place, and time.  Skin: Skin is warm and dry. She is not diaphoretic.  Psychiatric: She has a normal mood and affect. Her behavior is normal. Judgment and thought  content normal.    MAU Course  Procedures Results for orders placed or performed during the hospital encounter of 04/05/16 (from the past 24 hour(s))  Urinalysis, Routine w reflex microscopic     Status: Abnormal   Collection Time: 04/05/16 10:46 AM  Result Value Ref Range   Color, Urine YELLOW YELLOW   APPearance HAZY (A) CLEAR   Specific Gravity, Urine 1.033 (H) 1.005 - 1.030   pH 5.0 5.0 - 8.0   Glucose, UA NEGATIVE NEGATIVE mg/dL   Hgb urine  dipstick MODERATE (A) NEGATIVE   Bilirubin Urine SMALL (A) NEGATIVE   Ketones, ur 5 (A) NEGATIVE mg/dL   Protein, ur 30 (A) NEGATIVE mg/dL   Nitrite NEGATIVE NEGATIVE   Leukocytes, UA TRACE (A) NEGATIVE   RBC / HPF 0-5 0 - 5 RBC/hpf   WBC, UA 0-5 0 - 5 WBC/hpf   Bacteria, UA NONE SEEN NONE SEEN   Squamous Epithelial / LPF 6-30 (A) NONE SEEN   Mucous PRESENT   Wet prep, genital     Status: Abnormal   Collection Time: 04/05/16 11:35 AM  Result Value Ref Range   Yeast Wet Prep HPF POC NONE SEEN NONE SEEN   Trich, Wet Prep PRESENT (A) NONE SEEN   Clue Cells Wet Prep HPF POC PRESENT (A) NONE SEEN   WBC, Wet Prep HPF POC MANY (A) NONE SEEN   Sperm NONE SEEN   CBC     Status: Abnormal   Collection Time: 04/05/16 12:33 PM  Result Value Ref Range   WBC 11.5 (H) 4.0 - 10.5 K/uL   RBC 4.62 3.87 - 5.11 MIL/uL   Hemoglobin 12.3 12.0 - 15.0 g/dL   HCT 30.8 65.7 - 84.6 %   MCV 82.7 78.0 - 100.0 fL   MCH 26.6 26.0 - 34.0 pg   MCHC 32.2 30.0 - 36.0 g/dL   RDW 96.2 (H) 95.2 - 84.1 %   Platelets 331 150 - 400 K/uL   US Ob Transvaginal  Result Date: 04/05/2016 CLINICAL DATA:  Pregnant, low back pain, vaginal bleeding, no fetal heart tones EXAM: TRANSVAGINAL OB ULTRASOUND TECHNIQUE: Transvaginal ultrasound was performed for complete evaluation of the gestation as well as the maternal uterus, adnexal regions, and pelvic cul-de-sac. COMPARISON:  03/01/2016 FINDINGS: Intrauterine gestational sac: Single, mildly irregular Yolk sac:  Visualized. Embryo:  Visualized. Cardiac Activity: Not Visualized. CRL:   2.0  mm   5 w 5 d                  [place] EDC: 12/01/2016 Subchorionic hemorrhage:  Small subchronic hemorrhage. Maternal uterus/adnexae: Bilateral ovaries are within normal limits, noting a left corpus luteal cyst. No free fluid. IMPRESSION: Single intrauterine gestation, measuring 5 weeks 5 days by crown-rump length. No cardiac activity is visualized, possibly reflecting early gestational age. Recommend  follow-up US in 10-14 days for definitive diagnosis. This recommendation follows SRU consensus guidelines: Diagnostic Criteria for Nonviable Pregnancy Early in the First Trimester. Malva Limes Med 2013; 324:4010-27. Electronically Signed   By: Charline Bills M.D.   On: 04/05/2016 12:23    MDM U/a, gc/ct, wet prep Unable to doppler FHTs -- ultrasound ordered B positive Wet prep -- + trich -- flagyl 2 gm PO in MAU Ultrasound shows IUP, embryo measuring [redacted]w[redacted]d, no cardiac activity -- s/w radiologist who states he can't confirm whether this is failed pregnancy vs a new pregnancy s/p miscarriage of pregnancy from 1/31.  S/w Patient regarding results. Prior to today, patient has  had no bleeding since last MAU visit in January when her ultrasound shows IUGS with yolk sac. Discussed with patient that this is likely a failed pregnancy as there is not appropriate growth. Chances that this is a new pregnancy are minimal given that she had no episode of bleeding since previous visit. Gave patient option of expectant management, cytotec, or f/u ultrasound to confirm loss as recommended by radiologist. Patient opts for f/u ultrasound.   Assessment and Plan  A: 1. Trichomonal vaginitis during pregnancy in first trimester   2. Fetal heart tones not heard   3. Vaginal bleeding in pregnancy, first trimester   4. Threatened miscarriage    P: Discharge home Expedited partner treatment rx & info sheet given No intercourse x 1 week after both have been treated Discussed reasons to return to MAU Outpatient ultrasound in 10 days for viability ordered  GC/CT pending   Judeth Hornrin Arlee Bossard 04/05/2016, 11:14 AM

## 2016-04-06 LAB — GC/CHLAMYDIA PROBE AMP (~~LOC~~) NOT AT ARMC
CHLAMYDIA, DNA PROBE: NEGATIVE
NEISSERIA GONORRHEA: NEGATIVE

## 2016-04-09 ENCOUNTER — Encounter (HOSPITAL_COMMUNITY): Payer: Self-pay | Admitting: *Deleted

## 2016-04-09 ENCOUNTER — Inpatient Hospital Stay (HOSPITAL_COMMUNITY)
Admission: AD | Admit: 2016-04-09 | Discharge: 2016-04-09 | Disposition: A | Discharge status: Home / Self Care | Payer: Medicaid Other | Source: Ambulatory Visit | Attending: Obstetrics & Gynecology | Admitting: Obstetrics & Gynecology

## 2016-04-09 ENCOUNTER — Inpatient Hospital Stay (HOSPITAL_COMMUNITY): Payer: Medicaid Other

## 2016-04-09 DIAGNOSIS — Z6790 Unspecified blood type, Rh positive: Secondary | ICD-10-CM | POA: Diagnosis not present

## 2016-04-09 DIAGNOSIS — Z3A10 10 weeks gestation of pregnancy: Secondary | ICD-10-CM | POA: Diagnosis not present

## 2016-04-09 DIAGNOSIS — O209 Hemorrhage in early pregnancy, unspecified: Secondary | ICD-10-CM | POA: Diagnosis not present

## 2016-04-09 DIAGNOSIS — O039 Complete or unspecified spontaneous abortion without complication: Secondary | ICD-10-CM

## 2016-04-09 DIAGNOSIS — Z87891 Personal history of nicotine dependence: Secondary | ICD-10-CM | POA: Insufficient documentation

## 2016-04-09 LAB — CBC
HCT: 34.9 % — ABNORMAL LOW (ref 36.0–46.0)
Hemoglobin: 11.5 g/dL — ABNORMAL LOW (ref 12.0–15.0)
MCH: 26.9 pg (ref 26.0–34.0)
MCHC: 33 g/dL (ref 30.0–36.0)
MCV: 81.5 fL (ref 78.0–100.0)
Platelets: 331 10*3/uL (ref 150–400)
RBC: 4.28 MIL/uL (ref 3.87–5.11)
RDW: 16.6 % — ABNORMAL HIGH (ref 11.5–15.5)
WBC: 16 10*3/uL — ABNORMAL HIGH (ref 4.0–10.5)

## 2016-04-09 LAB — HCG, QUANTITATIVE, PREGNANCY: hCG, Beta Chain, Quant, S: 7654 m[IU]/mL — ABNORMAL HIGH (ref <5)

## 2016-04-09 MED ORDER — HYDROMORPHONE HCL 1 MG/ML IJ SOLN
1.0000 mg | Freq: Once | INTRAMUSCULAR | Status: DC
  Filled 2016-04-09: qty 1

## 2016-04-09 MED ORDER — OXYCODONE-ACETAMINOPHEN 5-325 MG PO TABS: 1.0000 | ORAL_TABLET | Freq: Four times a day (QID) | ORAL | 0 refills | Status: DC | PRN

## 2016-04-09 MED ORDER — HYDROMORPHONE HCL 1 MG/ML IJ SOLN
1.0000 mg | Freq: Once | INTRAMUSCULAR | Status: AC
  Administered 2016-04-09: 1 mg via INTRAVENOUS

## 2016-04-09 MED ORDER — LACTATED RINGERS IV BOLUS (SEPSIS)
1000.0000 mL | Freq: Once | INTRAVENOUS | Status: AC
  Administered 2016-04-09: 1000 mL via INTRAVENOUS

## 2016-04-09 MED ORDER — MISOPROSTOL 200 MCG PO TABS
800.0000 ug | ORAL_TABLET | Freq: Once | ORAL | Status: AC
  Administered 2016-04-09: 800 ug via RECTAL
  Filled 2016-04-09: qty 4

## 2016-04-09 MED ORDER — IBUPROFEN 800 MG PO TABS: 800.0000 mg | ORAL_TABLET | Freq: Three times a day (TID) | ORAL | 0 refills | Status: DC | PRN

## 2016-04-09 MED ORDER — HYDROMORPHONE HCL 1 MG/ML IJ SOLN
1.0000 mg | Freq: Once | INTRAMUSCULAR | Status: AC
Start: 2016-04-09 — End: 2016-04-09
  Administered 2016-04-09: 1 mg via INTRAVENOUS
  Filled 2016-04-09: qty 1

## 2016-04-09 NOTE — Discharge Instructions (Signed)

## 2016-04-09 NOTE — MAU Provider Note (Signed)
History     CSN: [id number]  Arrival date and time: 04/09/16 4010   First Provider Initiated Contact with Patient 04/09/16 [phone]      Chief Complaint  Patient presents with  . Vaginal Bleeding  . Abdominal Pain   [id number] @[redacted]w[redacted]d  by LMP here with VB and cramping. Bleeding started 4 days ago and worsened around midnight when she notes becoming as heavy as a menstrual cycle. Cramping started around the same time. She has not used anything for the pain. Rates pain now a 1 after pain medication. She was seen 4 days ago in MAU for VB and found to have a viable IUP measuring [redacted]w[redacted]d however this was discordant with her LMP and a previous US 5 weeks earlier in which the IUGS measured [redacted]w[redacted]d, ultimately making unclear of failed pregnancy vs. new pregnancy.   OB History    Gravida Para Term Preterm AB Living   5 2 2  0 2 2   SAB TAB Ectopic Multiple Live Births   2 0 0 0 2      Obstetric Comments   05/2009: pLTCS for NRFHT. 7lbs 2oz. 1 layer chromic closure      Past Medical History:  Diagnosis Date  . Hx of trichomoniasis   . Hypertension     Past Surgical History:  Procedure Laterality Date  . CESAREAN SECTION  2011  . CESAREAN SECTION N/A 09/20/2015   Procedure: CESAREAN SECTION;  Surgeon: Levie Heritage, DO;  Location: [facility] BIRTHING SUITES;  Service: Obstetrics;  Laterality: N/A;  . CHOLECYSTECTOMY  after baby was born    History reviewed. No pertinent family history.  Social History  Substance Use Topics  . Smoking status: Former Smoker    Packs/day: 0.25    Types: Cigarettes  . Smokeless tobacco: Never Used  . Alcohol use No    Allergies: No Known Allergies  Prescriptions Prior to Admission  Medication Sig Dispense Refill Last Dose  . acetaminophen (TYLENOL) 325 MG tablet Take 650 mg by mouth every 6 (six) hours as needed for mild pain or moderate pain.   04/04/2016 at Unknown time    Review of Systems  Gastrointestinal: Positive for abdominal pain.  Genitourinary:  Positive for vaginal bleeding.  Neurological: Negative for dizziness and light-headedness.   Physical Exam   Blood pressure 141/85, pulse 87, temperature 98.7 F (37.1 C), temperature source Oral, resp. rate 20, last menstrual period 01/20/2016, SpO2 100 %, currently breastfeeding.  Physical Exam  Nursing note and vitals reviewed. Constitutional: She is oriented to person, place, and time. She appears well-developed and well-nourished. No distress.  HENT:  Head: Normocephalic and atraumatic.  Neck: Normal range of motion.  Cardiovascular: Normal rate.   Respiratory: Effort normal.  GI: Soft. She exhibits no distension.  Genitourinary:  Genitourinary Comments: External: no lesions or erythema Vagina: rugated, parous, moderate amt clots removed from vault, no POC seen    Musculoskeletal: Normal range of motion.  Neurological: She is alert and oriented to person, place, and time.  Skin: Skin is warm and dry.  Psychiatric: She has a normal mood and affect.   US Ob Transvaginal  Result Date: 04/09/2016 CLINICAL DATA:  31 year old pregnant female presents with vaginal bleeding and pelvic pain since midnight. EXAM: TRANSVAGINAL OB ULTRASOUND TECHNIQUE: Transvaginal ultrasound was performed for complete evaluation of the gestation as well as the maternal uterus, adnexal regions, and pelvic cul-de-sac. COMPARISON:  04/05/2016 obstetric scan. FINDINGS: Anteverted uterus measures 11.5 x 5.3 x 5.1 cm. No uterine fibroids  or other myometrial abnormalities. Endometrium is thickened (19 mm) and heterogeneous. No intrauterine gestational sac on this scan. No focal endometrial mass. Endocervical canal is prominently distended with heterogeneous blood products up to 25 mm thickness. Left ovary measures 3.6 x 2.1 x 2.8 cm and contains a corpus luteum. Right ovary measures 3.4 x 2.4 x 1.6 cm. No abnormal ovarian or adnexal masses. No abnormal free fluid in the pelvis. IMPRESSION: 1. Thickened heterogeneous  endometrium. Endocervical canal distended with heterogeneous blood products. Previously visualized intrauterine gestation is absent on this scan. Findings are compatible with spontaneous abortion in progress. 2. No ovarian or adnexal abnormality. Electronically Signed   By: Delbert PhenixJason A Poff M.D.   On: 04/09/2016 09:09   Results for orders placed or performed during the hospital encounter of 04/09/16 (from the past 24 hour(s))  CBC     Status: Abnormal   Collection Time: 04/09/16  7:39 AM  Result Value Ref Range   WBC 16.0 (H) 4.0 - 10.5 K/uL   RBC 4.28 3.87 - 5.11 MIL/uL   Hemoglobin 11.5 (L) 12.0 - 15.0 g/dL   HCT 78.234.9 (L) 95.636.0 - 21.346.0 %   MCV 81.5 78.0 - 100.0 fL   MCH 26.9 26.0 - 34.0 pg   MCHC 33.0 30.0 - 36.0 g/dL   RDW 08.616.6 (H) 57.811.5 - 46.915.5 %   Platelets 331 150 - 400 K/uL  hCG, quantitative, pregnancy     Status: Abnormal   Collection Time: 04/09/16  7:39 AM  Result Value Ref Range   hCG, Beta Chain, Quant, S 7,654 (H) <5 mIU/mL    MAU Course  Procedures LR 1 L Dilaudid 1mg  IV x2 doses Cytotec 800 mcg pr  MDM Labs and US ordered and reviewed. US consistent with SAB. Cytotec to facilitate completion.   1036: bleeding small, no clots, comfortable and pain relieved. Stable for discharge home. Follow up in office in 2 weeks, follow quant to negative.  Assessment and Plan   1. Spontaneous abortion   2. Vaginal bleeding in pregnancy, first trimester   3. Blood type, Rh positive    Discharge home Follow up in 2 weeks with Dr. Clearance CootsHarper Bleeding precautions Abstain from IC Rx Oxycodone #6 Rx Ibuprofen  Allergies as of 04/09/2016   No Known Allergies     Medication List    STOP taking these medications   acetaminophen 325 MG tablet Commonly known as:  TYLENOL     TAKE these medications   ibuprofen 800 MG tablet Commonly known as:  ADVIL,MOTRIN Take 1 tablet (800 mg total) by mouth every 8 (eight) hours as needed.   oxyCODONE-acetaminophen 5-325 MG tablet Commonly  known as:  ROXICET Take 1 tablet by mouth every 6 (six) hours as needed for severe pain.      Donette LarryMelanie Elizabelle Fite, CNM 04/09/2016, 8:30 AM

## 2016-04-09 NOTE — MAU Note (Signed)
Patient presents to mau via ems with c/o vaginal bleeding since midnight; red in color. Lower abdominal pain 10/10 numerically that feels cramping in nature. Patient states she feels like she is having another miscarriage. 20G IV noted on arrival; per EMS 4mg  zofran iv received in route to hospital; vomited x1 .

## 2016-04-17 ENCOUNTER — Ambulatory Visit (HOSPITAL_COMMUNITY): Payer: Medicaid Other | Attending: Student

## 2016-04-18 ENCOUNTER — Ambulatory Visit: Payer: Self-pay

## 2016-10-03 ENCOUNTER — Emergency Department (HOSPITAL_COMMUNITY): Payer: Medicaid Other

## 2016-10-03 ENCOUNTER — Encounter (HOSPITAL_COMMUNITY): Payer: Self-pay | Admitting: Emergency Medicine

## 2016-10-03 ENCOUNTER — Emergency Department (HOSPITAL_COMMUNITY)
Admission: EM | Admit: 2016-10-03 | Discharge: 2016-10-03 | Disposition: A | Discharge status: Home / Self Care | Payer: Medicaid Other | Attending: Emergency Medicine | Admitting: Emergency Medicine

## 2016-10-03 DIAGNOSIS — I10 Essential (primary) hypertension: Secondary | ICD-10-CM | POA: Diagnosis not present

## 2016-10-03 DIAGNOSIS — K529 Noninfective gastroenteritis and colitis, unspecified: Secondary | ICD-10-CM | POA: Diagnosis not present

## 2016-10-03 DIAGNOSIS — Z79899 Other long term (current) drug therapy: Secondary | ICD-10-CM | POA: Diagnosis not present

## 2016-10-03 DIAGNOSIS — R112 Nausea with vomiting, unspecified: Secondary | ICD-10-CM

## 2016-10-03 DIAGNOSIS — R109 Unspecified abdominal pain: Secondary | ICD-10-CM | POA: Diagnosis present

## 2016-10-03 DIAGNOSIS — F17210 Nicotine dependence, cigarettes, uncomplicated: Secondary | ICD-10-CM | POA: Diagnosis not present

## 2016-10-03 LAB — URINALYSIS, ROUTINE W REFLEX MICROSCOPIC
Bilirubin Urine: NEGATIVE
Glucose, UA: NEGATIVE mg/dL
HGB URINE DIPSTICK: NEGATIVE
Ketones, ur: NEGATIVE mg/dL
Leukocytes, UA: NEGATIVE
NITRITE: NEGATIVE
Protein, ur: 30 mg/dL — AB
SPECIFIC GRAVITY, URINE: 1.03 (ref 1.005–1.030)
pH: 5 (ref 5.0–8.0)

## 2016-10-03 LAB — COMPREHENSIVE METABOLIC PANEL
ALBUMIN: 3.9 g/dL (ref 3.5–5.0)
ALK PHOS: 72 U/L (ref 38–126)
ALT: 11 U/L — ABNORMAL LOW (ref 14–54)
AST: 15 U/L (ref 15–41)
Anion gap: 8 (ref 5–15)
BILIRUBIN TOTAL: 0.4 mg/dL (ref 0.3–1.2)
BUN: 15 mg/dL (ref 6–20)
CALCIUM: 9.1 mg/dL (ref 8.9–10.3)
CO2: 23 mmol/L (ref 22–32)
CREATININE: 1.25 mg/dL — AB (ref 0.44–1.00)
Chloride: 105 mmol/L (ref 101–111)
GFR calc Af Amer: >60 mL/min (ref >60)
GFR calc non Af Amer: 57 mL/min — ABNORMAL LOW (ref >60)
GLUCOSE: 128 mg/dL — AB (ref 65–99)
Potassium: 4.2 mmol/L (ref 3.5–5.1)
Sodium: 136 mmol/L (ref 135–145)
TOTAL PROTEIN: 7.5 g/dL (ref 6.5–8.1)

## 2016-10-03 LAB — CBC
HCT: 41.3 % (ref 36.0–46.0)
Hemoglobin: 13.2 g/dL (ref 12.0–15.0)
MCH: 26 pg (ref 26.0–34.0)
MCHC: 32 g/dL (ref 30.0–36.0)
MCV: 81.3 fL (ref 78.0–100.0)
PLATELETS: 318 10*3/uL (ref 150–400)
RBC: 5.08 MIL/uL (ref 3.87–5.11)
RDW: 15.8 % — AB (ref 11.5–15.5)
WBC: 15.3 10*3/uL — ABNORMAL HIGH (ref 4.0–10.5)

## 2016-10-03 LAB — LIPASE, BLOOD: Lipase: 32 U/L (ref 11–51)

## 2016-10-03 LAB — POC URINE PREG, ED: PREG TEST UR: NEGATIVE

## 2016-10-03 MED ORDER — IOPAMIDOL (ISOVUE-300) INJECTION 61%
INTRAVENOUS | Status: AC
  Administered 2016-10-03: 100 mL
  Filled 2016-10-03: qty 100

## 2016-10-03 MED ORDER — PROMETHAZINE HCL 25 MG PO TABS: 25.0000 mg | ORAL_TABLET | Freq: Four times a day (QID) | ORAL | 0 refills | Status: DC | PRN

## 2016-10-03 MED ORDER — DICYCLOMINE HCL 10 MG PO CAPS: 10.0000 mg | ORAL_CAPSULE | Freq: Three times a day (TID) | ORAL | 0 refills | Status: DC

## 2016-10-03 MED ORDER — SODIUM CHLORIDE 0.9 % IV BOLUS (SEPSIS)
1500.0000 mL | Freq: Once | INTRAVENOUS | Status: AC
  Administered 2016-10-03: 1500 mL via INTRAVENOUS

## 2016-10-03 NOTE — Discharge Instructions (Signed)
CALL MEDICAID WORKER TO FIND OUT WHERE YOUR PRIMARY CARE PROVIDER CLINIC IS TO ESTABLISH CARE. PLEASE CALL AS SOON AS POSSIBLE. RETURN TO ER IF YOU HAVE BLOODY STOOLS, SEVERE VOMITING, FEVER, OR WORSENING PAIN.

## 2016-10-03 NOTE — ED Provider Notes (Addendum)
[facility]-EMERGENCY DEPT Provider Note   CSN: [id number] Arrival date & time: 10/03/16  1128     History   Chief Complaint Chief Complaint  Patient presents with  . Abdominal Pain    HPI Suzanne Bush is a 31 y.o. female.  31yo F w/ h/o cholecystectomy who p/w abdominal pain and vomiting. She reports a few weeks of intermittent abdominal pain across the middle of her abdomen that is worsened by eating or smoking. Some radiation to lower back. She has had associated cold sweats, nausea and non-bloody vomiting. Last BM was this morning and was normal, no diarrhea, black stools or bloody stools. No urinary sx, vaginal bleeding or discharge, fevers, sick contacts, or recent travel. She has never had this before.      Past Medical History:  Diagnosis Date  . Hx of trichomoniasis   . Hypertension     Patient Active Problem List   Diagnosis Date Noted  . Status post cesarean delivery 09/21/2015  . Preeclampsia 09/20/2015  . Gestational hypertension w/o significant proteinuria in 3rd trimester 09/13/2015  . Supervision of normal pregnancy in third trimester 08/26/2015  . Obesity affecting pregnancy in third trimester 08/26/2015  . BMI 40.0-44.9, adult (HCC) 08/26/2015  . History of cesarean delivery 08/26/2015    Past Surgical History:  Procedure Laterality Date  . CESAREAN SECTION  2011  . CESAREAN SECTION N/A 09/20/2015   Procedure: CESAREAN SECTION;  Surgeon: Levie HeritageJacob J Stinson, DO;  [facility];  Service: Obstetrics;  Laterality: N/A;  . CHOLECYSTECTOMY  after baby was born    OB History    Gravida Para Term Preterm AB Living   5 2 2  0 2 2   SAB TAB Ectopic Multiple Live Births   2 0 0 0 2      Obstetric Comments   05/2009: pLTCS for NRFHT. 7lbs 2oz. 1 layer chromic closure       Home Medications    Prior to Admission medications   Medication Sig Start Date End Date Taking? Authorizing Provider  dicyclomine (BENTYL) 10 MG capsule Take 1 capsule  (10 mg total) by mouth 4 (four) times daily -  before meals and at bedtime. For cramping 10/03/16   Pantera Winterrowd, Ambrose Finlandachel Morgan, MD  ibuprofen (ADVIL,MOTRIN) 800 MG tablet Take 1 tablet (800 mg total) by mouth every 8 (eight) hours as needed. 04/09/16   Donette LarryBhambri, Melanie, CNM  oxyCODONE-acetaminophen (ROXICET) 5-325 MG tablet Take 1 tablet by mouth every 6 (six) hours as needed for severe pain. 04/09/16   Donette LarryBhambri, Melanie, CNM  promethazine (PHENERGAN) 25 MG tablet Take 1 tablet (25 mg total) by mouth every 6 (six) hours as needed for nausea or vomiting. 10/03/16   Cande Mastropietro, Ambrose Finlandachel Morgan, MD    Family History History reviewed. No pertinent family history.  Social History Social History  Substance Use Topics  . Smoking status: Current Every Day Smoker    Packs/day: 0.25    Types: Cigarettes  . Smokeless tobacco: Never Used  . Alcohol use No     Allergies   Patient has no known allergies.   Review of Systems Review of Systems All other systems reviewed and are negative except that which was mentioned in HPI   Physical Exam Updated Vital Signs BP (!) 127/94 (BP Location: Right Arm)   Pulse 64   Temp 98.3 F (36.8 C) (Oral)   Resp 16   Ht 5\' 5"  (1.651 m)   Wt 100.2 kg (221 lb)   LMP 01/20/2016  SpO2 100%   Breastfeeding? Unknown   BMI 36.78 kg/m   Physical Exam  Constitutional: She is oriented to person, place, and time. She appears well-developed and well-nourished. No distress.  HENT:  Head: Normocephalic and atraumatic.  Moist mucous membranes  Eyes: Pupils are equal, round, and reactive to light. Conjunctivae are normal.  Neck: Neck supple.  Cardiovascular: Normal rate, regular rhythm and normal heart sounds.   No murmur heard. Pulmonary/Chest: Effort normal and breath sounds normal.  Abdominal: Soft. Bowel sounds are normal. She exhibits no distension. There is tenderness (periumbilical, RLQ). There is no rebound and no guarding.  Musculoskeletal: She exhibits no edema.    Neurological: She is alert and oriented to person, place, and time.  Fluent speech  Skin: Skin is warm and dry.  Psychiatric: She has a normal mood and affect. Judgment normal.  Nursing note and vitals reviewed.    ED Treatments / Results  Labs (all labs ordered are listed, but only abnormal results are displayed) Labs Reviewed  COMPREHENSIVE METABOLIC PANEL - Abnormal; Notable for the following:       Result Value   Glucose, Bld 128 (*)    Creatinine, Ser 1.25 (*)    ALT 11 (*)    GFR calc non Af Amer 57 (*)    All other components within normal limits  CBC - Abnormal; Notable for the following:    WBC 15.3 (*)    RDW 15.8 (*)    All other components within normal limits  URINALYSIS, ROUTINE W REFLEX MICROSCOPIC - Abnormal; Notable for the following:    Protein, ur 30 (*)    Bacteria, UA RARE (*)    Squamous Epithelial / LPF 0-5 (*)    All other components within normal limits  LIPASE, BLOOD  POC URINE PREG, ED    EKG  EKG Interpretation None       Radiology Ct Abdomen Pelvis W Contrast  Result Date: 10/03/2016 CLINICAL DATA:  Intermittent abdominal pain with nausea and vomiting EXAM: CT ABDOMEN AND PELVIS WITH CONTRAST TECHNIQUE: Multidetector CT imaging of the abdomen and pelvis was performed using the standard protocol following bolus administration of intravenous contrast. CONTRAST:  ISOVUE-300 IOPAMIDOL (ISOVUE-300) INJECTION 61% COMPARISON:  07/17/2009 FINDINGS: Lower chest: Lung bases demonstrate no acute consolidation or pleural effusion. Normal heart size. Hepatobiliary: No focal hepatic abnormality. Surgical clips in the gallbladder fossa. No biliary dilatation. Pancreas: Unremarkable. No pancreatic ductal dilatation or surrounding inflammatory changes. Spleen: Normal in size without focal abnormality. Adrenals/Urinary Tract: Adrenal glands are within normal limits. Kidneys show no hydronephrosis. The bladder is unremarkable. Stomach/Bowel: Stomach within  normal limits. Normal appendix. Scattered fluid-filled but nondilated small bowel within the abdomen and lower pelvis. Questionable mild wall thickening at the ileocecal junction Vascular/Lymphatic: No significant vascular findings are present. No enlarged abdominal or pelvic lymph nodes. Reproductive: Uterus and bilateral adnexa are unremarkable. Other: Negative for free air or free fluid.  Small in the umbilicus. Musculoskeletal: No acute or significant osseous findings. IMPRESSION: 1. Scattered fluid-filled but non enlarged bowel loops in the abdomen and pelvis. Possible mild wall thickening at the ileocecal junction and ascending colon, findings could relate to mild enterocolitis of infectious or inflammatory etiology. Negative for a bowel obstruction. Normal appendix. 2. Interval cholecystectomy.  Negative for biliary dilatation. Electronically Signed   By: Jasmine Pang M.D.   On: 10/03/2016 18:49    Procedures Procedures (including critical care time)  Medications Ordered in ED Medications  sodium chloride 0.9 %  bolus 1,500 mL (1,500 mLs Intravenous New Bag/Given 10/03/16 1755)  iopamidol (ISOVUE-300) 61 % injection (100 mLs  Contrast Given 10/03/16 1818)     Initial Impression / Assessment and Plan / ED Course  I have reviewed the triage vital signs and the nursing notes.  Pertinent labs & imaging results that were available during my care of the patient were reviewed by me and considered in my medical decision making (see chart for details).     PT w/ Several weeks of central abdominal pain intermittently with nausea and vomiting. She was nontoxic on exam with normal vital signs. She had tenderness of the paralumbar umbilical abdomen and right lower quadrant with no distention or guarding. Her labs show mild leukocytosis, mild AK I, no other acute findings. I obtained CT given leukocytosis and tenderness on exam. CT shows mild inflammation c/w entercolitis, infectious versus  inflammatory.  On reexamination, patient is tolerating liquids and is comfortable. She has had no bloody stools, no severe vomiting and no fevers therefore I feel she is safe for discharge at this time but I have emphasized the importance of follow-up with a PCP. I explained that a viral or bacterial process is the most likely explanation of this inflammatory process but occasionally someone with these findings will require gastroenterology evaluation. Provided with Bentyl and Phenergan to use at home and discussed supportive measures. Emphasized return precautions including bloody stools, severe vomiting, fever, or worsening symptoms. Patient voiced understanding and was discharged in satisfactory condition.  Final Clinical Impressions(s) / ED Diagnoses   Final diagnoses:  Colitis  Non-intractable vomiting with nausea, unspecified vomiting type    New Prescriptions New Prescriptions   DICYCLOMINE (BENTYL) 10 MG CAPSULE    Take 1 capsule (10 mg total) by mouth 4 (four) times daily -  before meals and at bedtime. For cramping   PROMETHAZINE (PHENERGAN) 25 MG TABLET    Take 1 tablet (25 mg total) by mouth every 6 (six) hours as needed for nausea or vomiting.     Tynell Winchell, Ambrose Finland, MD 10/03/16 2012    Clarene Duke Ambrose Finland, MD 10/03/16 2029

## 2016-10-03 NOTE — ED Triage Notes (Signed)
Pt states she has been having abd pain on and off for a few weeks. Pt states she "breaks out in cold sweats when the pain gets bad." Pt also complains of some n/v. Denies diarrhea

## 2016-11-28 ENCOUNTER — Encounter (HOSPITAL_COMMUNITY): Payer: Self-pay | Admitting: *Deleted

## 2016-11-28 ENCOUNTER — Emergency Department (HOSPITAL_COMMUNITY)
Admission: EM | Admit: 2016-11-28 | Discharge: 2016-11-28 | Disposition: A | Discharge status: Home / Self Care | Payer: Medicaid Other | Attending: Emergency Medicine | Admitting: Emergency Medicine

## 2016-11-28 ENCOUNTER — Ambulatory Visit (HOSPITAL_COMMUNITY)
Admission: EM | Admit: 2016-11-28 | Discharge: 2016-11-28 | Disposition: A | Discharge status: Home / Self Care | Payer: Medicaid Other | Attending: Family Medicine | Admitting: Family Medicine

## 2016-11-28 ENCOUNTER — Encounter (HOSPITAL_COMMUNITY): Payer: Self-pay | Admitting: Family Medicine

## 2016-11-28 DIAGNOSIS — Y929 Unspecified place or not applicable: Secondary | ICD-10-CM | POA: Insufficient documentation

## 2016-11-28 DIAGNOSIS — S40021A Contusion of right upper arm, initial encounter: Secondary | ICD-10-CM | POA: Diagnosis present

## 2016-11-28 DIAGNOSIS — X58XXXA Exposure to other specified factors, initial encounter: Secondary | ICD-10-CM | POA: Insufficient documentation

## 2016-11-28 DIAGNOSIS — S0083XA Contusion of other part of head, initial encounter: Secondary | ICD-10-CM | POA: Insufficient documentation

## 2016-11-28 DIAGNOSIS — Y939 Activity, unspecified: Secondary | ICD-10-CM | POA: Diagnosis not present

## 2016-11-28 DIAGNOSIS — T07XXXA Unspecified multiple injuries, initial encounter: Secondary | ICD-10-CM | POA: Diagnosis not present

## 2016-11-28 DIAGNOSIS — Y998 Other external cause status: Secondary | ICD-10-CM | POA: Diagnosis not present

## 2016-11-28 NOTE — ED Provider Notes (Signed)
[facility]   [id number] 11/28/16 Arrival Time: 1000   SUBJECTIVE:  Suzanne Bush is a 31 y.o. female who presents to the urgent care with complaint of multiple painful bruises.  Patient states that she developed these bruises overnight on Saturday. She had spent the day remembering the father of her child, the man having been murdered a year ago this past Saturday.  Patient remembers driving home after releasing balloons but then having no recollection of anything until she woke up Sunday morning. At that time she noticed multiple bruises and was quite uncomfortable.  Patient works in [employer] and has a daughter with the school right now.  Patient denies any history of prior loss of consciousness like this, seizure disorder, diabetes, hypertension, or other chronic disease.  The bruises are diffuse. There's been no bleeding in the urine or in the mouth. She's not had a tongue laceration. Patient has no recent fever or head injury.     Past Medical History:  Diagnosis Date  . Hx of trichomoniasis   . Hypertension    History reviewed. No pertinent family history. Social History   Social History  . Marital status: Single    Spouse name: N/A  . Number of children: N/A  . Years of education: N/A   Occupational History  . Not on file.   Social History Main Topics  . Smoking status: Current Every Day Smoker    Packs/day: 0.25    Types: Cigarettes  . Smokeless tobacco: Never Used  . Alcohol use No  . Drug use: No  . Sexual activity: Yes    Birth control/ protection: None   Other Topics Concern  . Not on file   Social History Narrative  . No narrative on file   No outpatient prescriptions have been marked as taking for the 11/28/16 encounter [facility] Encounter).   No Known Allergies    ROS: As per HPI, remainder of ROS negative.   OBJECTIVE:   Vitals:   11/28/16 1041 11/28/16 1042  BP: (!) 157/94   Pulse: 62   Resp: 16   Temp: 98.1  F (36.7 C)   TempSrc: Oral   SpO2: 98%   Weight:  221 lb (100.2 kg)  Height:  5\' 5"  (1.651 m)     General appearance: alert; no distress Eyes: PERRL; EOMI; conjunctiva normal HENT: normocephalic; with those on the right cheek, external ears normal without trauma; nasal mucosa normal; oral mucosa normal with no tongue laceration. Patient has a piercing of her tongue however. Neck: supple, no adenopathy Lungs: clear to auscultation bilaterally Heart: regular rate and rhythm Abdomen: soft, non-tender; bowel sounds normal; no masses or organomegaly; no guarding or rebound tenderness Back: no CVA tenderness Extremities: no cyanosis or edema; symmetrical with no gross deformities, large ecchymotic area of the right upper biceps, left arm, right leg. No bruising is seen in the abdomen or back. Skin: warm and dry, see above Neurologic: normal gait; grossly normal Psychological: alert and cooperative; normal mood and affect      Labs:  Results for orders placed or performed during the hospital encounter of 10/03/16  Lipase, blood  Result Value Ref Range   Lipase 32 11 - 51 U/L  Comprehensive metabolic panel  Result Value Ref Range   Sodium 136 135 - 145 mmol/L   Potassium 4.2 3.5 - 5.1 mmol/L   Chloride 105 101 - 111 mmol/L   CO2 23 22 - 32 mmol/L   Glucose, Bld 128 (H)  65 - 99 mg/dL   BUN 15 6 - 20 mg/dL   Creatinine, Ser 4.541.25 (H) 0.44 - 1.00 mg/dL   Calcium 9.1 8.9 - 09.810.3 mg/dL   Total Protein 7.5 6.5 - 8.1 g/dL   Albumin 3.9 3.5 - 5.0 g/dL   AST 15 15 - 41 U/L   ALT 11 (L) 14 - 54 U/L   Alkaline Phosphatase 72 38 - 126 U/L   Total Bilirubin 0.4 0.3 - 1.2 mg/dL   GFR calc non Af Amer 57 (L) >60 mL/min   GFR calc Af Amer >60 >60 mL/min   Anion gap 8 5 - 15  CBC  Result Value Ref Range   WBC 15.3 (H) 4.0 - 10.5 K/uL   RBC 5.08 3.87 - 5.11 MIL/uL   Hemoglobin 13.2 12.0 - 15.0 g/dL   HCT 11.941.3 14.736.0 - 82.946.0 %   MCV 81.3 78.0 - 100.0 fL   MCH 26.0 26.0 - 34.0 pg   MCHC  32.0 30.0 - 36.0 g/dL   RDW 56.215.8 (H) 13.011.5 - 86.515.5 %   Platelets 318 150 - 400 K/uL  Urinalysis, Routine w reflex microscopic  Result Value Ref Range   Color, Urine YELLOW YELLOW   APPearance CLEAR CLEAR   Specific Gravity, Urine 1.030 1.005 - 1.030   pH 5.0 5.0 - 8.0   Glucose, UA NEGATIVE NEGATIVE mg/dL   Hgb urine dipstick NEGATIVE NEGATIVE   Bilirubin Urine NEGATIVE NEGATIVE   Ketones, ur NEGATIVE NEGATIVE mg/dL   Protein, ur 30 (A) NEGATIVE mg/dL   Nitrite NEGATIVE NEGATIVE   Leukocytes, UA NEGATIVE NEGATIVE   RBC / HPF 0-5 0 - 5 RBC/hpf   WBC, UA 0-5 0 - 5 WBC/hpf   Bacteria, UA RARE (A) NONE SEEN   Squamous Epithelial / LPF 0-5 (A) NONE SEEN   Mucus PRESENT   POC urine preg, ED  Result Value Ref Range   Preg Test, Ur NEGATIVE NEGATIVE    Labs Reviewed - No data to display    ASSESSMENT & PLAN:  1. Multiple bruises     I am concerned that we have no explanation for the multiple bruises nor the loss of consciousness on Saturday. It's possible he may have had a seizure. It's important to rule out serious blood problems or seizure disorder, so you will need further evaluation in the emergency department were the appropriate test can be run.  Reviewed expectations re: course of current medical issues. Questions answered. Outlined signs and symptoms indicating need for more acute intervention. Patient verbalized understanding. After Visit Summary given.       Elvina SidleLauenstein, Martez Weiand, MD 11/28/16 1101

## 2016-11-28 NOTE — ED Notes (Signed)
Pt stated that she wants to leave. Pt encouraged to stay. Pt will try to wait around, but stated that she "has things to do".

## 2016-11-28 NOTE — ED Triage Notes (Signed)
Pt here for bruising all over body. Reports she doesn't remember how they got there.
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
## 2016-11-28 NOTE — ED Notes (Signed)
Pt requesting to leave, pt encouraged to stay, pt LWBS after triage

## 2016-11-28 NOTE — Discharge Instructions (Signed)
I am concerned that we have no explanation for the multiple bruises nor the loss of consciousness on Saturday. It's possible he may have had a seizure. It's important to rule out serious blood problems or seizure disorder, so you will need further evaluation in the emergency department were the appropriate test can be run.

## 2016-12-27 ENCOUNTER — Encounter (HOSPITAL_COMMUNITY): Payer: Self-pay | Admitting: *Deleted

## 2016-12-27 ENCOUNTER — Inpatient Hospital Stay (HOSPITAL_COMMUNITY)
Admission: AD | Admit: 2016-12-27 | Discharge: 2016-12-27 | Disposition: A | Discharge status: Home / Self Care | Payer: Medicaid Other | Source: Ambulatory Visit | Attending: Obstetrics & Gynecology | Admitting: Obstetrics & Gynecology

## 2016-12-27 DIAGNOSIS — Z79899 Other long term (current) drug therapy: Secondary | ICD-10-CM | POA: Insufficient documentation

## 2016-12-27 DIAGNOSIS — Z9889 Other specified postprocedural states: Secondary | ICD-10-CM | POA: Diagnosis not present

## 2016-12-27 DIAGNOSIS — F17210 Nicotine dependence, cigarettes, uncomplicated: Secondary | ICD-10-CM | POA: Diagnosis not present

## 2016-12-27 DIAGNOSIS — Z90710 Acquired absence of both cervix and uterus: Secondary | ICD-10-CM | POA: Diagnosis not present

## 2016-12-27 DIAGNOSIS — G8929 Other chronic pain: Secondary | ICD-10-CM | POA: Diagnosis not present

## 2016-12-27 DIAGNOSIS — M545 Low back pain: Secondary | ICD-10-CM | POA: Insufficient documentation

## 2016-12-27 LAB — POCT PREGNANCY, URINE: PREG TEST UR: NEGATIVE

## 2016-12-27 LAB — URINALYSIS, ROUTINE W REFLEX MICROSCOPIC
Bilirubin Urine: NEGATIVE
Glucose, UA: NEGATIVE mg/dL
Ketones, ur: NEGATIVE mg/dL
Leukocytes, UA: NEGATIVE
Nitrite: POSITIVE — AB
PROTEIN: NEGATIVE mg/dL
Specific Gravity, Urine: 1.024 (ref 1.005–1.030)
pH: 5 (ref 5.0–8.0)

## 2016-12-27 LAB — RAPID URINE DRUG SCREEN, HOSP PERFORMED
Amphetamines: NOT DETECTED
BENZODIAZEPINES: POSITIVE — AB
Barbiturates: NOT DETECTED
COCAINE: POSITIVE — AB
Opiates: NOT DETECTED
Tetrahydrocannabinol: POSITIVE — AB
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
## 2016-12-27 NOTE — MAU Note (Signed)
Pt reports she has had lower back pain "for years", states she has been evaluated for it a lot of times but they can't do anything about it. Also reports right heel pain.

## 2016-12-27 NOTE — MAU Provider Note (Signed)
History     CSN: [id number]  Arrival date and time: 12/27/16 1147   None     Chief Complaint  Patient presents with  . Back Pain   Suzanne Bush is a 31 y.o. [id number] who presents today with back pain. She states that she has had this pain for about 2-3 years, but it has never gotten better. She reports that it was worse "with all the spinal taps and epidurals I have had". She has no other complaints today.    Back Pain  This is a new problem. Episode onset: has had the pain for more than 2 years. States that it was from the epidurals and spinal taps she has had.  The problem occurs intermittently. The problem is unchanged. The pain is present in the thoracic spine and lumbar spine. The quality of the pain is described as aching. The pain does not radiate. The pain is at a severity of 9/10. The pain is the same all the time. The symptoms are aggravated by standing. Pertinent negatives include no dysuria or fever. Risk factors include obesity. Treatments tried: tylenol, ibuprofen  The treatment provided no relief.   Past Medical History:  Diagnosis Date  . Hx of trichomoniasis   . Hypertension     Past Surgical History:  Procedure Laterality Date  . CESAREAN SECTION  2011  . CESAREAN SECTION N/A 09/20/2015   Procedure: CESAREAN SECTION;  Surgeon: Levie HeritageJacob J Stinson, DO;  [facility];  Service: Obstetrics;  Laterality: N/A;  . CHOLECYSTECTOMY  after baby was born    History reviewed. No pertinent family history.  Social History   Tobacco Use  . Smoking status: Current Every Day Smoker    Packs/day: 0.25    Types: Cigarettes  . Smokeless tobacco: Never Used  Substance Use Topics  . Alcohol use: No  . Drug use: No    Allergies: No Known Allergies  Medications Prior to Admission  Medication Sig Dispense Refill Last Dose  . dicyclomine (BENTYL) 10 MG capsule Take 1 capsule (10 mg total) by mouth 4 (four) times daily -  before meals and at bedtime. For  cramping 10 capsule 0   . ibuprofen (ADVIL,MOTRIN) 800 MG tablet Take 1 tablet (800 mg total) by mouth every 8 (eight) hours as needed. 30 tablet 0   . oxyCODONE-acetaminophen (ROXICET) 5-325 MG tablet Take 1 tablet by mouth every 6 (six) hours as needed for severe pain. 6 tablet 0   . promethazine (PHENERGAN) 25 MG tablet Take 1 tablet (25 mg total) by mouth every 6 (six) hours as needed for nausea or vomiting. 8 tablet 0     Review of Systems  Constitutional: Negative for chills and fever.  Gastrointestinal: Negative for nausea and vomiting.  Genitourinary: Negative for dysuria, frequency and urgency.  Musculoskeletal: Positive for back pain.   Physical Exam   Blood pressure (!) 143/98, pulse 75, temperature 98.4 F (36.9 C), temperature source Oral, resp. rate 15, height 5\' 5"  (1.651 m), weight 219 lb (99.3 kg), last menstrual period 01/07/2016, SpO2 100 %, not currently breastfeeding.  Physical Exam  Nursing note and vitals reviewed. Constitutional: She is oriented to person, place, and time. She appears well-developed and well-nourished. No distress.  HENT:  Head: Normocephalic.  Cardiovascular: Normal rate.  Respiratory: Effort normal.  GI: Soft. There is no tenderness. There is no rebound.  Genitourinary:  Genitourinary Comments: No CVA tenderness   Musculoskeletal: Normal range of motion. She exhibits no tenderness.  Neurological: She is alert and oriented to person, place, and time.  Skin: Skin is warm and dry.  Psychiatric: She has a normal mood and affect.   Results for orders placed or performed during the hospital encounter of 12/27/16 (from the past 24 hour(s))  Urinalysis, Routine w reflex microscopic     Status: Abnormal   Collection Time: 12/27/16 12:47 PM  Result Value Ref Range   Color, Urine YELLOW YELLOW   APPearance CLEAR CLEAR   Specific Gravity, Urine 1.024 1.005 - 1.030   pH 5.0 5.0 - 8.0   Glucose, UA NEGATIVE NEGATIVE mg/dL   Hgb urine dipstick  MODERATE (A) NEGATIVE   Bilirubin Urine NEGATIVE NEGATIVE   Ketones, ur NEGATIVE NEGATIVE mg/dL   Protein, ur NEGATIVE NEGATIVE mg/dL   Nitrite POSITIVE (A) NEGATIVE   Leukocytes, UA NEGATIVE NEGATIVE   RBC / HPF 0-5 0 - 5 RBC/hpf   WBC, UA 0-5 0 - 5 WBC/hpf   Bacteria, UA FEW (A) NONE SEEN   Squamous Epithelial / LPF 0-5 (A) NONE SEEN   Mucus PRESENT   Pregnancy, urine POC     Status: None   Collection Time: 12/27/16  1:38 PM  Result Value Ref Range   Preg Test, Ur NEGATIVE NEGATIVE    MAU Course  Procedures  MDM Patient informed that no emergency condition apparent today. This has been a chronic issue for her, and at this time we are unable to manage it for her. I recommend that she see her PCP or orthopedic doctor to have this evaluated or referral for PT.  Patient looked up in the RX database, narcotic RX from 09/24/15 and 04/09/16. Benzo RX from 12/08/16, and 12/16/16.   Assessment and Plan   1. Chronic bilateral low back pain without sciatica    DC home Comfort measures reviewed  RX: none  Return to MAU as needed FU with OB as planned  Follow-up Information    [facility]   Contact information: [address] [place] [id number] [phone]            Thressa ShellerHeather Aryaa Bunting 12/27/2016, 2:44 PM

## 2016-12-27 NOTE — Discharge Instructions (Signed)
In late 2019, the [facility] will be moving to the [facility]. At that time, the MAU (Maternity Admissions Unit), where you are being seen today, will no longer see non-pregnant patients. We strongly encourage you to find a doctor's office before that time, so that you can be seen with any GYN concerns, like vaginal discharge, urinary tract infection, etc.. in a timely manner.   In order to make the office visit more convenient, the Center for [facility] will be offering evening hours from 4pm-7:30pm on Monday. There will be same-day appointments, walk-in appointments and scheduled appointments available during this time. We will be adding more evening hours over the next year before the move.   [facility] @ [facility] [phone]- [phone]  For urgent needs, [facility] is also available for management of urgent GYN complaints such as vaginal discharge or urinary tract infections.   Primary care follow up  [facility] (will see you even if you do not have sickle cell): [phone] [facility]: [phone] [facility]: [phone]    Back Exercises If you have pain in your back, do these exercises 2-3 times each day or as told by your doctor. When the pain goes away, do the exercises once each day, but repeat the steps more times for each exercise (do more repetitions). If you do not have pain in your back, do these exercises once each day or as told by your doctor. Exercises Single Knee to Chest  Do these steps 3-5 times in a row for each leg: 1. Lie on your back on a firm bed or the floor with your legs stretched out. 2. Bring one knee to your chest. 3. Hold your knee to your chest by grabbing your knee or thigh. 4. Pull on your knee until you feel a gentle stretch in your lower back. 5. Keep doing the stretch for 10-30 seconds. 6. Slowly let go of your leg and straighten  it.  Pelvic Tilt  Do these steps 5-10 times in a row: 1. Lie on your back on a firm bed or the floor with your legs stretched out. 2. Bend your knees so they point up to the ceiling. Your feet should be flat on the floor. 3. Tighten your lower belly (abdomen) muscles to press your lower back against the floor. This will make your tailbone point up to the ceiling instead of pointing down to your feet or the floor. 4. Stay in this position for 5-10 seconds while you gently tighten your muscles and breathe evenly.  Cat-Cow  Do these steps until your lower back bends more easily: 1. Get on your hands and knees on a firm surface. Keep your hands under your shoulders, and keep your knees under your hips. You may put padding under your knees. 2. Let your head hang down, and make your tailbone point down to the floor so your lower back is round like the back of a cat. 3. Stay in this position for 5 seconds. 4. Slowly lift your head and make your tailbone point up to the ceiling so your back hangs low (sags) like the back of a cow. 5. Stay in this position for 5 seconds.  Press-Ups  Do these steps 5-10 times in a row: 1. Lie on your belly (face-down) on the floor. 2. Place your hands near your head, about shoulder-width apart. 3. While you keep your back relaxed and keep  your hips on the floor, slowly straighten your arms to raise the top half of your body and lift your shoulders. Do not use your back muscles. To make yourself more comfortable, you may change where you place your hands. 4. Stay in this position for 5 seconds. 5. Slowly return to lying flat on the floor.  Bridges  Do these steps 10 times in a row: 1. Lie on your back on a firm surface. 2. Bend your knees so they point up to the ceiling. Your feet should be flat on the floor. 3. Tighten your butt muscles and lift your butt off of the floor until your waist is almost as high as your knees. If you do not feel the muscles working  in your butt and the back of your thighs, slide your feet 1-2 inches farther away from your butt. 4. Stay in this position for 3-5 seconds. 5. Slowly lower your butt to the floor, and let your butt muscles relax.  If this exercise is too easy, try doing it with your arms crossed over your chest. Belly Crunches  Do these steps 5-10 times in a row: 1. Lie on your back on a firm bed or the floor with your legs stretched out. 2. Bend your knees so they point up to the ceiling. Your feet should be flat on the floor. 3. Cross your arms over your chest. 4. Tip your chin a little bit toward your chest but do not bend your neck. 5. Tighten your belly muscles and slowly raise your chest just enough to lift your shoulder blades a tiny bit off of the floor. 6. Slowly lower your chest and your head to the floor.  Back Lifts Do these steps 5-10 times in a row: 1. Lie on your belly (face-down) with your arms at your sides, and rest your forehead on the floor. 2. Tighten the muscles in your legs and your butt. 3. Slowly lift your chest off of the floor while you keep your hips on the floor. Keep the back of your head in line with the curve in your back. Look at the floor while you do this. 4. Stay in this position for 3-5 seconds. 5. Slowly lower your chest and your face to the floor.  Contact a doctor if:  Your back pain gets a lot worse when you do an exercise.  Your back pain does not lessen 2 hours after you exercise. If you have any of these problems, stop doing the exercises. Do not do them again unless your doctor says it is okay. Get help right away if:  You have sudden, very bad back pain. If this happens, stop doing the exercises. Do not do them again unless your doctor says it is okay. This information is not intended to replace advice given to you by your health care provider. Make sure you discuss any questions you have with your health care provider. Document Released: 02/18/2010  Document Revised: 06/24/2015 Document Reviewed: 03/12/2014 Elsevier Interactive Patient Education  [employer]2018 [employer]

## 2016-12-28 ENCOUNTER — Other Ambulatory Visit: Payer: Self-pay | Admitting: Certified Nurse Midwife

## 2017-01-24 ENCOUNTER — Other Ambulatory Visit: Payer: Self-pay | Admitting: Nurse Practitioner

## 2017-02-01 ENCOUNTER — Other Ambulatory Visit: Payer: Self-pay | Admitting: Nurse Practitioner

## 2017-02-01 DIAGNOSIS — N6310 Unspecified lump in the right breast, unspecified quadrant: Secondary | ICD-10-CM

## 2017-02-04 ENCOUNTER — Ambulatory Visit (HOSPITAL_COMMUNITY)
Admission: RE | Admit: 2017-02-04 | Discharge: 2017-02-04 | Disposition: A | Discharge status: Home / Self Care | Payer: Medicaid Other | Source: Ambulatory Visit | Attending: *Deleted | Admitting: *Deleted

## 2017-02-04 ENCOUNTER — Other Ambulatory Visit (HOSPITAL_COMMUNITY): Payer: Self-pay | Admitting: *Deleted

## 2017-02-04 DIAGNOSIS — M779 Enthesopathy, unspecified: Secondary | ICD-10-CM

## 2017-02-04 DIAGNOSIS — R0989 Other specified symptoms and signs involving the circulatory and respiratory systems: Secondary | ICD-10-CM | POA: Diagnosis not present

## 2017-02-04 DIAGNOSIS — M2042 Other hammer toe(s) (acquired), left foot: Secondary | ICD-10-CM | POA: Insufficient documentation

## 2017-02-04 DIAGNOSIS — R05 Cough: Secondary | ICD-10-CM | POA: Diagnosis not present

## 2017-02-04 DIAGNOSIS — M7731 Calcaneal spur, right foot: Secondary | ICD-10-CM | POA: Diagnosis not present

## 2017-02-06 ENCOUNTER — Other Ambulatory Visit: Payer: Self-pay

## 2017-02-09 ENCOUNTER — Other Ambulatory Visit: Payer: Self-pay

## 2017-02-09 ENCOUNTER — Inpatient Hospital Stay: Admission: RE | Admit: 2017-02-09 | Payer: Self-pay | Source: Ambulatory Visit

## 2017-02-09 ENCOUNTER — Emergency Department (HOSPITAL_COMMUNITY): Payer: Self-pay

## 2017-02-09 ENCOUNTER — Emergency Department (HOSPITAL_COMMUNITY)
Admission: EM | Admit: 2017-02-09 | Discharge: 2017-02-09 | Payer: Self-pay | Attending: Emergency Medicine | Admitting: Emergency Medicine

## 2017-02-09 ENCOUNTER — Inpatient Hospital Stay
Admission: RE | Admit: 2017-02-09 | Discharge: 2017-02-09 | Disposition: A | Discharge status: Home / Self Care | Payer: Self-pay | Source: Ambulatory Visit | Attending: Nurse Practitioner | Admitting: Nurse Practitioner

## 2017-02-09 ENCOUNTER — Encounter (HOSPITAL_COMMUNITY): Payer: Self-pay

## 2017-02-09 DIAGNOSIS — F17210 Nicotine dependence, cigarettes, uncomplicated: Secondary | ICD-10-CM | POA: Insufficient documentation

## 2017-02-09 DIAGNOSIS — R6884 Jaw pain: Secondary | ICD-10-CM | POA: Insufficient documentation

## 2017-02-09 DIAGNOSIS — F4329 Adjustment disorder with other symptoms: Secondary | ICD-10-CM | POA: Insufficient documentation

## 2017-02-09 DIAGNOSIS — Y939 Activity, unspecified: Secondary | ICD-10-CM | POA: Insufficient documentation

## 2017-02-09 DIAGNOSIS — Z79899 Other long term (current) drug therapy: Secondary | ICD-10-CM | POA: Insufficient documentation

## 2017-02-09 DIAGNOSIS — R45851 Suicidal ideations: Secondary | ICD-10-CM | POA: Insufficient documentation

## 2017-02-09 DIAGNOSIS — Y929 Unspecified place or not applicable: Secondary | ICD-10-CM | POA: Insufficient documentation

## 2017-02-09 DIAGNOSIS — I10 Essential (primary) hypertension: Secondary | ICD-10-CM | POA: Insufficient documentation

## 2017-02-09 DIAGNOSIS — S01511A Laceration without foreign body of lip, initial encounter: Secondary | ICD-10-CM | POA: Insufficient documentation

## 2017-02-09 DIAGNOSIS — Z8659 Personal history of other mental and behavioral disorders: Secondary | ICD-10-CM | POA: Insufficient documentation

## 2017-02-09 DIAGNOSIS — Y998 Other external cause status: Secondary | ICD-10-CM | POA: Insufficient documentation

## 2017-02-09 DIAGNOSIS — Z046 Encounter for general psychiatric examination, requested by authority: Secondary | ICD-10-CM | POA: Insufficient documentation

## 2017-02-09 HISTORY — DX: Major depressive disorder, single episode, unspecified: F32.9

## 2017-02-09 HISTORY — DX: Depression, unspecified: F32.A

## 2017-02-09 HISTORY — DX: Attention-deficit hyperactivity disorder, unspecified type: F90.9

## 2017-02-09 HISTORY — DX: Bipolar disorder, unspecified: F31.9

## 2017-02-09 LAB — COMPREHENSIVE METABOLIC PANEL
ALBUMIN: 4.1 g/dL (ref 3.5–5.0)
ALK PHOS: 78 U/L (ref 38–126)
ALT: 15 U/L (ref 14–54)
ANION GAP: 6 (ref 5–15)
AST: 17 U/L (ref 15–41)
BUN: 22 mg/dL — ABNORMAL HIGH (ref 6–20)
CALCIUM: 9 mg/dL (ref 8.9–10.3)
CO2: 25 mmol/L (ref 22–32)
Chloride: 107 mmol/L (ref 101–111)
Creatinine, Ser: 0.98 mg/dL (ref 0.44–1.00)
GFR calc Af Amer: >60 mL/min (ref >60)
GFR calc non Af Amer: >60 mL/min (ref >60)
GLUCOSE: 90 mg/dL (ref 65–99)
POTASSIUM: 3.7 mmol/L (ref 3.5–5.1)
SODIUM: 138 mmol/L (ref 135–145)
Total Bilirubin: 0.3 mg/dL (ref 0.3–1.2)
Total Protein: 7.6 g/dL (ref 6.5–8.1)

## 2017-02-09 LAB — CBC
HEMATOCRIT: 37.8 % (ref 36.0–46.0)
HEMOGLOBIN: 12.1 g/dL (ref 12.0–15.0)
MCH: 26.5 pg (ref 26.0–34.0)
MCHC: 32 g/dL (ref 30.0–36.0)
MCV: 82.9 fL (ref 78.0–100.0)
Platelets: 300 10*3/uL (ref 150–400)
RBC: 4.56 MIL/uL (ref 3.87–5.11)
RDW: 14.9 % (ref 11.5–15.5)
WBC: 14.1 10*3/uL — AB (ref 4.0–10.5)

## 2017-02-09 LAB — RAPID URINE DRUG SCREEN, HOSP PERFORMED
AMPHETAMINES: POSITIVE — AB
BARBITURATES: NOT DETECTED
BENZODIAZEPINES: POSITIVE — AB
COCAINE: NOT DETECTED
Opiates: NOT DETECTED
TETRAHYDROCANNABINOL: NOT DETECTED

## 2017-02-09 LAB — POC URINE PREG, ED: Preg Test, Ur: NEGATIVE

## 2017-02-09 LAB — ETHANOL: Alcohol, Ethyl (B): <10 mg/dL (ref <10)

## 2017-02-09 LAB — ACETAMINOPHEN LEVEL

## 2017-02-09 LAB — SALICYLATE LEVEL: Salicylate Lvl: <7 mg/dL (ref 2.8–30.0)

## 2017-02-09 NOTE — ED Notes (Signed)
Bed: WA28 Expected date:  Expected time:  Means of arrival:  Comments: [employer] SI 
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
## 2017-02-09 NOTE — Progress Notes (Signed)
[facility].  On assessment with the psychiatric team, she denies suicidal/homicidal ideations, hallucinations, and substance abuse.  Psychiatrically cleared by the psychiatrist and this provider.    Suzanne Bush, PMHNP

## 2017-02-09 NOTE — ED Notes (Signed)
PT UP WALKING IN ROOM TALKING ON CELL PHONE.

## 2017-02-09 NOTE — ED Notes (Signed)
TOLERATING ORAL FLUIDS AND MEAL
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
## 2017-02-09 NOTE — [facility] Assessment (Signed)
[facility] Assessment Progress Note  Case was staffed with Cresenciano GenreLord DNP, who recommended patient be discharged after being medically cleared.

## 2017-02-09 NOTE — ED Notes (Signed)
Patient transported to CT. [employer] ESCORT

## 2017-02-09 NOTE — ED Notes (Signed)
[employer] STAYED WITH PT THAT ARRIVED WITH PT. AWARE OF PT'S DISCHARGE
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
## 2017-02-09 NOTE — ED Notes (Signed)
TTS AT BEDSIDE. WILL AWAIT MEDICAL CLEARANCE

## 2017-02-09 NOTE — ED Notes (Signed)
EDPA Provider at bedside. 

## 2017-02-09 NOTE — ED Notes (Signed)
TTS HERE FOR RE EVALUATION

## 2017-02-09 NOTE — ED Notes (Addendum)
SPOKE WITH TTS- WORKING ON DISCHARGE AT THIS TIME. EDPA TYLER MADE AWARE

## 2017-02-09 NOTE — ED Notes (Signed)
EDPA HERE TO RE EVALUATE

## 2017-02-23 ENCOUNTER — Other Ambulatory Visit: Payer: Self-pay

## 2017-04-02 ENCOUNTER — Encounter (HOSPITAL_COMMUNITY): Payer: Self-pay | Admitting: Emergency Medicine

## 2017-04-02 ENCOUNTER — Ambulatory Visit (HOSPITAL_COMMUNITY)
Admission: EM | Admit: 2017-04-02 | Discharge: 2017-04-02 | Disposition: A | Discharge status: Home / Self Care | Payer: Self-pay

## 2017-04-02 ENCOUNTER — Other Ambulatory Visit: Payer: Self-pay

## 2017-04-02 DIAGNOSIS — R52 Pain, unspecified: Secondary | ICD-10-CM

## 2017-04-02 DIAGNOSIS — R05 Cough: Secondary | ICD-10-CM

## 2017-04-02 DIAGNOSIS — R0789 Other chest pain: Secondary | ICD-10-CM

## 2017-04-02 DIAGNOSIS — J111 Influenza due to unidentified influenza virus with other respiratory manifestations: Secondary | ICD-10-CM

## 2017-04-02 DIAGNOSIS — J029 Acute pharyngitis, unspecified: Secondary | ICD-10-CM

## 2017-04-02 DIAGNOSIS — R69 Illness, unspecified: Secondary | ICD-10-CM

## 2017-04-02 DIAGNOSIS — R059 Cough, unspecified: Secondary | ICD-10-CM

## 2017-04-02 DIAGNOSIS — J020 Streptococcal pharyngitis: Secondary | ICD-10-CM

## 2017-04-02 LAB — POCT RAPID STREP A: STREPTOCOCCUS, GROUP A SCREEN (DIRECT): POSITIVE — AB

## 2017-04-02 MED ORDER — OSELTAMIVIR PHOSPHATE 75 MG PO CAPS: 75.0000 mg | ORAL_CAPSULE | Freq: Two times a day (BID) | ORAL | 0 refills | Status: DC

## 2017-04-02 MED ORDER — BENZONATATE 100 MG PO CAPS: 100.0000 mg | ORAL_CAPSULE | Freq: Three times a day (TID) | ORAL | 0 refills | Status: DC | PRN

## 2017-04-02 MED ORDER — AMOXICILLIN-POT CLAVULANATE 875-125 MG PO TABS: 1.0000 | ORAL_TABLET | Freq: Two times a day (BID) | ORAL | 0 refills | Status: DC

## 2017-04-02 NOTE — Discharge Instructions (Addendum)
You may take 500mg Tylenol with ibuprofen 400-600mg every 6 hours for pain and inflammation. °

## 2017-04-02 NOTE — ED Provider Notes (Signed)
  MRN: [id number] DOB: 07-Oct-1985  Subjective:   Suzanne Bush is a 32 y.o. female presenting for 1 day history of fever, body aches, sore throat, headaches, productive cough, chest pain, malaise. Has not tried otc medications. Denies sinus pain, sinus congestion, shob, wheezing, n/v, abdominal pain, rashes. Smokes 1/2ppd.   No current facility-administered medications for this encounter.   Current Outpatient Medications:  .  amphetamine-dextroamphetamine (ADDERALL) 30 MG tablet, Take 30 mg by mouth daily., Disp: , Rfl:  .  Brexpiprazole (REXULTI) 1 MG TABS, Take 1 tablet by mouth daily., Disp: , Rfl:  .  hydrochlorothiazide (MICROZIDE) 12.5 MG capsule, Take 12.5 mg by mouth daily., Disp: , Rfl:  .  ibuprofen (ADVIL,MOTRIN) 800 MG tablet, Take 800 mg by mouth every 8 (eight) hours as needed., Disp: , Rfl:  .  benzonatate (TESSALON) 100 MG capsule, Take 1-2 capsules (100-200 mg total) by mouth 3 (three) times daily as needed for cough., Disp: 40 capsule, Rfl: 0 .  oseltamivir (TAMIFLU) 75 MG capsule, Take 1 capsule (75 mg total) by mouth 2 (two) times daily., Disp: 10 capsule, Rfl: 0 .  VRAYLAR capsule, Take 3 mg by mouth daily., Disp: , Rfl: 0   Suzanne Bush has No Known Allergies.  Suzanne Bush  has a past medical history of ADHD, Bipolar 1 disorder (HCC), Depression, trichomoniasis, and Hypertension. Also  has a past surgical history that includes Cholecystectomy (after baby was born); Cesarean section (2011); and Cesarean section (N/A, 09/20/2015).  Objective:   Vitals: BP (!) 146/93 (BP Location: Left Arm)   Pulse 92   Temp 100.1 F (37.8 C) (Oral)   LMP 03/28/2017 (Approximate)   SpO2 100%   Physical Exam  Constitutional: She is oriented to person, place, and time. She appears well-developed and well-nourished.  HENT:  Right Ear: Tympanic membrane normal. No drainage or tenderness.  Left Ear: Tympanic membrane normal. No drainage or tenderness.  Mouth/Throat: Uvula is midline. No uvula  swelling. Oropharyngeal exudate and posterior oropharyngeal erythema present. No posterior oropharyngeal edema or tonsillar abscesses.  Eyes: Right eye exhibits no discharge. Left eye exhibits no discharge.  Neck: Normal range of motion. Neck supple.  Cardiovascular: Normal rate, regular rhythm and intact distal pulses. Exam reveals no gallop and no friction rub.  No murmur heard. Pulmonary/Chest: No respiratory distress. She has no wheezes. She has no rales.  Lymphadenopathy:    She has no cervical adenopathy.  Neurological: She is alert and oriented to person, place, and time.  Skin: Skin is warm and dry.  Psychiatric: She has a normal mood and affect.   Strep test positive in clinic.   Assessment and Plan :   Influenza-like illness  Body aches  Cough  Atypical chest pain  Sore throat  Strep pharyngitis  Start Tamiflu and supportive care for flu like illness. Will treat with amoxicillin for concurrent Strep pharyngitis.   Suzanne Bush, Suzanne Bush, [place]PA-C 04/02/17 2044

## 2017-04-02 NOTE — ED Triage Notes (Signed)
Pt reports cough, sore throat and body aches since yesterday.  Pt has not taken any OTC medications.

## 2017-09-25 ENCOUNTER — Inpatient Hospital Stay (HOSPITAL_COMMUNITY)
Admission: AD | Admit: 2017-09-25 | Discharge: 2017-09-26 | Disposition: A | Discharge status: Home / Self Care | Payer: Medicaid Other | Source: Ambulatory Visit | Attending: Obstetrics & Gynecology | Admitting: Obstetrics & Gynecology

## 2017-09-25 DIAGNOSIS — O26899 Other specified pregnancy related conditions, unspecified trimester: Secondary | ICD-10-CM

## 2017-09-25 DIAGNOSIS — R109 Unspecified abdominal pain: Secondary | ICD-10-CM

## 2017-09-25 DIAGNOSIS — Z3A01 Less than 8 weeks gestation of pregnancy: Secondary | ICD-10-CM | POA: Insufficient documentation

## 2017-09-25 DIAGNOSIS — O26891 Other specified pregnancy related conditions, first trimester: Secondary | ICD-10-CM | POA: Insufficient documentation

## 2017-09-25 DIAGNOSIS — R1030 Lower abdominal pain, unspecified: Secondary | ICD-10-CM | POA: Insufficient documentation

## 2017-09-26 ENCOUNTER — Encounter (HOSPITAL_COMMUNITY): Payer: Self-pay | Admitting: *Deleted

## 2017-09-26 ENCOUNTER — Inpatient Hospital Stay (HOSPITAL_COMMUNITY): Payer: Medicaid Other

## 2017-09-26 DIAGNOSIS — O26891 Other specified pregnancy related conditions, first trimester: Secondary | ICD-10-CM | POA: Diagnosis not present

## 2017-09-26 DIAGNOSIS — R1030 Lower abdominal pain, unspecified: Secondary | ICD-10-CM | POA: Diagnosis not present

## 2017-09-26 DIAGNOSIS — R109 Unspecified abdominal pain: Secondary | ICD-10-CM | POA: Diagnosis not present

## 2017-09-26 DIAGNOSIS — Z3A01 Less than 8 weeks gestation of pregnancy: Secondary | ICD-10-CM | POA: Diagnosis not present

## 2017-09-26 LAB — URINALYSIS, ROUTINE W REFLEX MICROSCOPIC
BILIRUBIN URINE: NEGATIVE
Glucose, UA: NEGATIVE mg/dL
KETONES UR: NEGATIVE mg/dL
LEUKOCYTES UA: NEGATIVE
Nitrite: POSITIVE — AB
PROTEIN: NEGATIVE mg/dL
SPECIFIC GRAVITY, URINE: 1.031 — AB (ref 1.005–1.030)
pH: 5 (ref 5.0–8.0)

## 2017-09-26 LAB — HCG, QUANTITATIVE, PREGNANCY: HCG, BETA CHAIN, QUANT, S: 305 m[IU]/mL — AB (ref <5)

## 2017-09-26 LAB — POCT PREGNANCY, URINE: PREG TEST UR: POSITIVE — AB

## 2017-09-26 NOTE — MAU Provider Note (Signed)
History     CSN: [id number]  Arrival date and time: 09/25/17 2357   First Provider Initiated Contact with Patient 09/26/17 0202      Chief Complaint  Patient presents with  . Abdominal Pain   HPI   Suzanne Bush is a 32 y.o. [id number] female at 2776w3d who presents to MAU with sudden onset of cramping midline abdominal pain that started yesterday evening around 6 pm. She reports positive HPT. Denies vaginal bleeding, vaginal discharge and urinary complaints.    OB History    Gravida  6   Para  2   Term  2   Preterm  0   AB  3   Living  2     SAB  3   TAB  0   Ectopic  0   Multiple  0   Live Births  2        Obstetric Comments  05/2009: pLTCS for NRFHT. 7lbs 2oz. 1 layer chromic closure        Past Medical History:  Diagnosis Date  . ADHD   . Bipolar 1 disorder (HCC)   . Depression   . Hx of trichomoniasis   . Hypertension     Past Surgical History:  Procedure Laterality Date  . CESAREAN SECTION  2011  . CESAREAN SECTION N/A 09/20/2015   Procedure: CESAREAN SECTION;  Surgeon: Levie HeritageJacob J Stinson, DO;  [facility];  Service: Obstetrics;  Laterality: N/A;  . CHOLECYSTECTOMY  after baby was born    History reviewed. No pertinent family history.  Social History   Tobacco Use  . Smoking status: Current Every Day Smoker    Packs/day: 0.25    Types: Cigarettes  . Smokeless tobacco: Never Used  . Tobacco comment: VAPE- USED  LAST MTH  Substance Use Topics  . Alcohol use: No  . Drug use: No    Frequency: 2.0 times per week    Types: Marijuana    Comment: LAST SMOKED 10-2016    Allergies: No Known Allergies  Medications Prior to Admission  Medication Sig Dispense Refill Last Dose  . amphetamine-dextroamphetamine (ADDERALL) 30 MG tablet Take 30 mg by mouth daily.   Past Week at Unknown time  . hydrochlorothiazide (MICROZIDE) 12.5 MG capsule Take 12.5 mg by mouth daily.   Past Month at Unknown time  . VRAYLAR capsule Take 3 mg by  mouth daily.  0 Past Week at Unknown time  . amoxicillin-clavulanate (AUGMENTIN) 875-125 MG tablet Take 1 tablet by mouth every 12 (twelve) hours. 10 tablet 0 NONE  . benzonatate (TESSALON) 100 MG capsule Take 1-2 capsules (100-200 mg total) by mouth 3 (three) times daily as needed for cough. 40 capsule 0   . Brexpiprazole (REXULTI) 1 MG TABS Take 1 tablet by mouth daily.   NONE  . ibuprofen (ADVIL,MOTRIN) 800 MG tablet Take 800 mg by mouth every 8 (eight) hours as needed.   More than a month at Unknown time  . oseltamivir (TAMIFLU) 75 MG capsule Take 1 capsule (75 mg total) by mouth 2 (two) times daily. 10 capsule 0 NONE    Review of Systems  Constitutional: Negative for chills and fever.  Gastrointestinal: Positive for abdominal pain. Negative for diarrhea and vomiting.  Genitourinary: Negative for difficulty urinating, dysuria, vaginal bleeding, vaginal discharge and vaginal pain.   Physical Exam   Blood pressure 132/87, pulse 73, temperature 98.2 F (36.8 C), temperature source Oral, resp. rate 20, height 5\' 5"  (1.651 m), weight 99.5  kg, last menstrual period 08/19/2017.  Physical Exam  Constitutional: She is oriented to person, place, and time. She appears well-developed and well-nourished. No distress.  HENT:  Head: Normocephalic and atraumatic.  Eyes: Conjunctivae and EOM are normal.  Neck: Normal range of motion.  Cardiovascular: Normal rate and regular rhythm.  Respiratory: Effort normal and breath sounds normal.  GI: Soft. She exhibits no distension. There is no tenderness. There is no rebound and no guarding.  Neurological: She is alert and oriented to person, place, and time.  Skin: Skin is warm and dry. She is not diaphoretic.  Psychiatric: She has a normal mood and affect. Thought content normal.    MAU Course  Procedures  MDM Upreg obtained and positive. Given reported sudden cramping abdominal pain with early pregnancy at [redacted]w[redacted]d without confirmed IUP, will obtain  TVUS. Have also ordered b-HCG.    b-HCG positive with low value of 305. TVUS remarkable for no intrauterine gestational sac. No adnexal masses present and ovaries appear normal.   Assessment and Plan   1. Cramping affecting pregnancy, antepartum   Given that IUP may not be identified with b-HCG <3000, patient will need repeat b-HCG and likely sono to evaluate pregnancy location. Patient reports that she has to report to jail each weekend starting at 10 am fridays. Since unable to schedule clinic lab visit, will have patient return to MAU early Friday morning for repeat lab test. Have placed future order. Strict return precautions discussed.    De Hollingshead 09/26/2017, 2:04 AM

## 2017-09-26 NOTE — MAU Note (Signed)
PT SAYS SHE SHARP LOWER ABD PAIN- STARTED  6PM- WHILE AT WORK- FEELS NAUSEA.   ATE SALAD  AND HAM/ CHEESE SANDWICH   AT  3 PM.   NO VOMITING.   NEVER HAPPENED  BEFORE.  NO MEDS.  DID HPT -  ON Sunday-  POSITIVE.  LAST SEX-  8-6.

## 2017-09-26 NOTE — Discharge Instructions (Signed)
Your pregnancy blood test level is very low, likely due to very early pregnancy. However, at this level we are unable to confirm that pregnancy is in the uterus and not in the correct location like within one of your tubes. Therefore, we need to repeat your blood work. Please return to the MAU on Friday morning at 6 am to have this repeated. At some point, you will likely need a repeat ultrasound. Return sooner if pain increases or you begin to have vaginal bleeding.

## 2017-10-04 ENCOUNTER — Encounter (HOSPITAL_COMMUNITY): Payer: Self-pay | Admitting: *Deleted

## 2017-10-04 ENCOUNTER — Inpatient Hospital Stay (HOSPITAL_COMMUNITY): Payer: Medicaid Other

## 2017-10-04 ENCOUNTER — Other Ambulatory Visit: Payer: Self-pay

## 2017-10-04 ENCOUNTER — Inpatient Hospital Stay (HOSPITAL_COMMUNITY)
Admission: AD | Admit: 2017-10-04 | Discharge: 2017-10-04 | Disposition: A | Discharge status: Home / Self Care | Payer: Medicaid Other | Source: Ambulatory Visit | Attending: Obstetrics and Gynecology | Admitting: Obstetrics and Gynecology

## 2017-10-04 DIAGNOSIS — O99341 Other mental disorders complicating pregnancy, first trimester: Secondary | ICD-10-CM | POA: Insufficient documentation

## 2017-10-04 DIAGNOSIS — Z3A01 Less than 8 weeks gestation of pregnancy: Secondary | ICD-10-CM | POA: Diagnosis not present

## 2017-10-04 DIAGNOSIS — Z79899 Other long term (current) drug therapy: Secondary | ICD-10-CM | POA: Diagnosis not present

## 2017-10-04 DIAGNOSIS — O161 Unspecified maternal hypertension, first trimester: Secondary | ICD-10-CM | POA: Diagnosis not present

## 2017-10-04 DIAGNOSIS — O26891 Other specified pregnancy related conditions, first trimester: Secondary | ICD-10-CM

## 2017-10-04 DIAGNOSIS — F319 Bipolar disorder, unspecified: Secondary | ICD-10-CM | POA: Insufficient documentation

## 2017-10-04 DIAGNOSIS — M545 Low back pain: Secondary | ICD-10-CM | POA: Diagnosis present

## 2017-10-04 DIAGNOSIS — O34219 Maternal care for unspecified type scar from previous cesarean delivery: Secondary | ICD-10-CM | POA: Insufficient documentation

## 2017-10-04 DIAGNOSIS — F909 Attention-deficit hyperactivity disorder, unspecified type: Secondary | ICD-10-CM | POA: Insufficient documentation

## 2017-10-04 DIAGNOSIS — F17210 Nicotine dependence, cigarettes, uncomplicated: Secondary | ICD-10-CM | POA: Diagnosis not present

## 2017-10-04 DIAGNOSIS — O3680X0 Pregnancy with inconclusive fetal viability, not applicable or unspecified: Secondary | ICD-10-CM

## 2017-10-04 DIAGNOSIS — Z9049 Acquired absence of other specified parts of digestive tract: Secondary | ICD-10-CM | POA: Insufficient documentation

## 2017-10-04 DIAGNOSIS — R109 Unspecified abdominal pain: Secondary | ICD-10-CM | POA: Diagnosis not present

## 2017-10-04 DIAGNOSIS — O99331 Smoking (tobacco) complicating pregnancy, first trimester: Secondary | ICD-10-CM | POA: Insufficient documentation

## 2017-10-04 DIAGNOSIS — F17290 Nicotine dependence, other tobacco product, uncomplicated: Secondary | ICD-10-CM | POA: Insufficient documentation

## 2017-10-04 LAB — WET PREP, GENITAL
Clue Cells Wet Prep HPF POC: NONE SEEN
Sperm: NONE SEEN
TRICH WET PREP: NONE SEEN
Yeast Wet Prep HPF POC: NONE SEEN

## 2017-10-04 LAB — URINALYSIS, ROUTINE W REFLEX MICROSCOPIC
BILIRUBIN URINE: NEGATIVE
Glucose, UA: NEGATIVE mg/dL
Ketones, ur: NEGATIVE mg/dL
LEUKOCYTES UA: NEGATIVE
NITRITE: NEGATIVE
PROTEIN: NEGATIVE mg/dL
SPECIFIC GRAVITY, URINE: 1.026 (ref 1.005–1.030)
pH: 5 (ref 5.0–8.0)

## 2017-10-04 LAB — CBC
HCT: 35.6 % — ABNORMAL LOW (ref 36.0–46.0)
Hemoglobin: 11.5 g/dL — ABNORMAL LOW (ref 12.0–15.0)
MCH: 26.9 pg (ref 26.0–34.0)
MCHC: 32.3 g/dL (ref 30.0–36.0)
MCV: 83.4 fL (ref 78.0–100.0)
PLATELETS: 317 10*3/uL (ref 150–400)
RBC: 4.27 MIL/uL (ref 3.87–5.11)
RDW: 15.1 % (ref 11.5–15.5)
WBC: 15.1 10*3/uL — AB (ref 4.0–10.5)

## 2017-10-04 LAB — HCG, QUANTITATIVE, PREGNANCY: hCG, Beta Chain, Quant, S: 11975 m[IU]/mL — ABNORMAL HIGH (ref <5)

## 2017-10-04 MED ORDER — ACETAMINOPHEN 500 MG PO TABS
1000.0000 mg | ORAL_TABLET | Freq: Once | ORAL | Status: AC
  Administered 2017-10-04: 1000 mg via ORAL
  Filled 2017-10-04: qty 2

## 2017-10-04 NOTE — MAU Provider Note (Signed)
History     CSN: [id number]  Arrival date and time: 10/04/17 4098   First Provider Initiated Contact with Patient 10/04/17 [phone]      Chief Complaint  Patient presents with  . Back Pain  . Abdominal Pain   HPI  Suzanne Bush is a 32 y.o. [id number] at [redacted]w[redacted]d who presents to MAU with chief complaint of back pain and low abdominal pain. Patient denies bleeding, abnormal vaginal discharge, fever or falls. Endorses sexual intercourse yesterday, no discomfort during sex.  Back pain New problem, onset last night. Locus of pain is lower back near SI joint, radiates across lower back to hips. Pain is rated 9-10/10. Denies aggravating or alleviating factors. Patient has not attempted to manage with medication  Low abdominal pain  New problem, onset last night. Locus of pain is suprapubic and radiates bilaterally across lower abdomen. Pain is 5/10. Denies aggravating or alleviating factors. Denies urinary symptoms.   OB History    Gravida  6   Para  2   Term  2   Preterm  0   AB  3   Living  2     SAB  3   TAB  0   Ectopic  0   Multiple  0   Live Births  2        Obstetric Comments  05/2009: pLTCS for NRFHT. 7lbs 2oz. 1 layer chromic closure        Past Medical History:  Diagnosis Date  . ADHD   . Bipolar 1 disorder (HCC)   . Depression   . Hx of trichomoniasis   . Hypertension     Past Surgical History:  Procedure Laterality Date  . CESAREAN SECTION  2011  . CESAREAN SECTION N/A 09/20/2015   Procedure: CESAREAN SECTION;  Surgeon: Levie Heritage, DO;  Location: [facility] BIRTHING SUITES;  Service: Obstetrics;  Laterality: N/A;  . CHOLECYSTECTOMY  after baby was born    No family history on file.  Social History   Tobacco Use  . Smoking status: Current Every Day Smoker    Packs/day: 0.25    Types: Cigarettes  . Smokeless tobacco: Never Used  . Tobacco comment: VAPE- USED  LAST MTH  Substance Use Topics  . Alcohol use: No  . Drug use: Not Currently   Frequency: 2.0 times per week    Types: Marijuana    Comment: LAST SMOKED 10-2016    Allergies: No Known Allergies  Medications Prior to Admission  Medication Sig Dispense Refill Last Dose  . amphetamine-dextroamphetamine (ADDERALL) 30 MG tablet Take 30 mg by mouth daily.   Past Week at Unknown time  . benzonatate (TESSALON) 100 MG capsule Take 1-2 capsules (100-200 mg total) by mouth 3 (three) times daily as needed for cough. 40 capsule 0   . Brexpiprazole (REXULTI) 1 MG TABS Take 1 tablet by mouth daily.   NONE  . hydrochlorothiazide (MICROZIDE) 12.5 MG capsule Take 12.5 mg by mouth daily.   Past Month at Unknown time  . ibuprofen (ADVIL,MOTRIN) 800 MG tablet Take 800 mg by mouth every 8 (eight) hours as needed.   More than a month at Unknown time  . VRAYLAR capsule Take 3 mg by mouth daily.  0 Past Week at Unknown time    Review of Systems  Respiratory: Negative for shortness of breath.   Gastrointestinal: Positive for abdominal pain. Negative for nausea and vomiting.  Musculoskeletal: Positive for back pain.  All other systems reviewed and are negative.  Physical Exam   Blood pressure 128/79, pulse 78, temperature 98.6 F (37 C), temperature source Oral, resp. rate 18, weight 98.4 kg, last menstrual period 08/19/2017.  Physical Exam  Nursing note and vitals reviewed. Constitutional: She is oriented to person, place, and time. She appears well-developed and well-nourished.  Cardiovascular: Normal rate.  Respiratory: Effort normal.  GI: Soft. She exhibits no distension. There is no tenderness. There is no rebound.  Genitourinary: Vagina normal. No vaginal discharge found.  Neurological: She is alert and oriented to person, place, and time. She has normal reflexes.  Skin: Skin is warm and dry.  Psychiatric: She has a normal mood and affect. Her behavior is normal. Judgment and thought content normal.    MAU Course  Procedures  MDM --Pale brown discharge visible on swab  collection  Patient Vitals for the past 24 hrs:  BP Temp Temp src Pulse Resp Weight  10/04/17 0913 128/79 98.6 F (37 C) Oral 78 18 -  10/04/17 0858 - - - - - 98.4 kg    Orders Placed This Encounter  Procedures  . Wet prep, genital  . US OB Transvaginal  . Urinalysis, Routine w reflex microscopic  . CBC  . hCG, quantitative, pregnancy   Results for orders placed or performed during the hospital encounter of 10/04/17 (from the past 24 hour(s))  Urinalysis, Routine w reflex microscopic     Status: Abnormal   Collection Time: 10/04/17  8:52 AM  Result Value Ref Range   Color, Urine YELLOW YELLOW   APPearance HAZY (A) CLEAR   Specific Gravity, Urine 1.026 1.005 - 1.030   pH 5.0 5.0 - 8.0   Glucose, UA NEGATIVE NEGATIVE mg/dL   Hgb urine dipstick MODERATE (A) NEGATIVE   Bilirubin Urine NEGATIVE NEGATIVE   Ketones, ur NEGATIVE NEGATIVE mg/dL   Protein, ur NEGATIVE NEGATIVE mg/dL   Nitrite NEGATIVE NEGATIVE   Leukocytes, UA NEGATIVE NEGATIVE   RBC / HPF 0-5 0 - 5 RBC/hpf   WBC, UA 0-5 0 - 5 WBC/hpf   Bacteria, UA RARE (A) NONE SEEN   Squamous Epithelial / LPF 6-10 0 - 5   Mucus PRESENT   Wet prep, genital     Status: Abnormal   Collection Time: 10/04/17  9:05 AM  Result Value Ref Range   Yeast Wet Prep HPF POC NONE SEEN NONE SEEN   Trich, Wet Prep NONE SEEN NONE SEEN   Clue Cells Wet Prep HPF POC NONE SEEN NONE SEEN   WBC, Wet Prep HPF POC FEW (A) NONE SEEN   Sperm NONE SEEN   CBC     Status: Abnormal   Collection Time: 10/04/17  9:39 AM  Result Value Ref Range   WBC 15.1 (H) 4.0 - 10.5 K/uL   RBC 4.27 3.87 - 5.11 MIL/uL   Hemoglobin 11.5 (L) 12.0 - 15.0 g/dL   HCT 40.9 (L) 81.1 - 91.4 %   MCV 83.4 78.0 - 100.0 fL   MCH 26.9 26.0 - 34.0 pg   MCHC 32.3 30.0 - 36.0 g/dL   RDW 78.2 95.6 - 21.3 %   Platelets 317 150 - 400 K/uL  hCG, quantitative, pregnancy     Status: Abnormal   Collection Time: 10/04/17  9:39 AM  Result Value Ref Range   hCG, Beta Chain, Quant, S  11,975 (H) <5 mIU/mL   US Ob Transvaginal  Result Date: 10/04/2017 CLINICAL DATA:  Patient with abdominal pain. First-trimester pregnancy. EXAM: OBSTETRIC <14 WK ULTRASOUND TECHNIQUE: Transabdominal ultrasound  was performed for evaluation of the gestation as well as the maternal uterus and adnexal regions. COMPARISON:  Pelvic ultrasound 09/26/2017 FINDINGS: Intrauterine gestational sac: Single Yolk sac:  Visualized. Embryo:  Not Visualized. Cardiac Activity: Not Visualized. MSD: 8.1 mm   5 w   4 d Subchorionic hemorrhage:  None visualized. Maternal uterus/adnexae: Normal right and left ovaries. No free fluid in the pelvis. IMPRESSION: Probable early intrauterine gestational sac and yolk sac but no fetal pole or cardiac activity yet visualized. Recommend follow-up quantitative B-HCG levels and follow-up US in 14 days to assess viability. This recommendation follows SRU consensus guidelines: Diagnostic Criteria for Nonviable Pregnancy Early in the First Trimester. Malva Limes Med 2013; 937:3428-76. Electronically Signed   By: Annia Belt M.D.   On: 10/04/2017 11:20     Assessment and Plan  --32 y.o. [id number] at 5w by [place] today --Pregnancy of unknown viability --Reviewed possible outcomes and plan of care with patient --Continue Prenatal vitamin, given safe medication handout (see AVS) --Discharge home in stable condition  F/u: Messaged [facility] to schedule repeat quant hCG for Monday 10/08/2017        Outpatient repeat ultrasound ordered Patient verbalized understanding that she will receive two different phone calls to schedule next steps  Calvert Cantor, CNM 10/04/2017, 11:37 AM

## 2017-10-04 NOTE — Discharge Instructions (Signed)

## 2017-10-04 NOTE — MAU Note (Signed)
Pt woke up this morning having lower back pain, then started having lower abd pain.  Denies bleeding.

## 2017-10-05 LAB — GC/CHLAMYDIA PROBE AMP (~~LOC~~) NOT AT ARMC
Chlamydia: NEGATIVE
Neisseria Gonorrhea: NEGATIVE

## 2017-10-08 ENCOUNTER — Other Ambulatory Visit: Payer: Self-pay

## 2017-12-06 ENCOUNTER — Other Ambulatory Visit (HOSPITAL_COMMUNITY)
Admission: RE | Admit: 2017-12-06 | Discharge: 2017-12-06 | Disposition: A | Discharge status: Home / Self Care | Payer: Medicaid Other | Source: Ambulatory Visit | Attending: Obstetrics | Admitting: Obstetrics

## 2017-12-06 ENCOUNTER — Encounter: Payer: Self-pay | Admitting: Obstetrics

## 2017-12-06 ENCOUNTER — Ambulatory Visit (INDEPENDENT_AMBULATORY_CARE_PROVIDER_SITE_OTHER): Payer: Medicaid Other | Admitting: Obstetrics

## 2017-12-06 VITALS — BP 99/67 | HR 82 | Wt 224.4 lb

## 2017-12-06 DIAGNOSIS — O99210 Obesity complicating pregnancy, unspecified trimester: Secondary | ICD-10-CM

## 2017-12-06 DIAGNOSIS — Z3482 Encounter for supervision of other normal pregnancy, second trimester: Secondary | ICD-10-CM

## 2017-12-06 DIAGNOSIS — E669 Obesity, unspecified: Secondary | ICD-10-CM

## 2017-12-06 DIAGNOSIS — F319 Bipolar disorder, unspecified: Secondary | ICD-10-CM

## 2017-12-06 DIAGNOSIS — Z3490 Encounter for supervision of normal pregnancy, unspecified, unspecified trimester: Secondary | ICD-10-CM

## 2017-12-06 DIAGNOSIS — O34219 Maternal care for unspecified type scar from previous cesarean delivery: Secondary | ICD-10-CM

## 2017-12-06 DIAGNOSIS — F909 Attention-deficit hyperactivity disorder, unspecified type: Secondary | ICD-10-CM

## 2017-12-06 DIAGNOSIS — I10 Essential (primary) hypertension: Secondary | ICD-10-CM

## 2017-12-06 DIAGNOSIS — O99212 Obesity complicating pregnancy, second trimester: Secondary | ICD-10-CM

## 2017-12-06 DIAGNOSIS — Z98891 History of uterine scar from previous surgery: Secondary | ICD-10-CM

## 2017-12-06 MED ORDER — PRENATE PIXIE 10-0.6-0.4-200 MG PO CAPS: 1.0000 | ORAL_CAPSULE | Freq: Every day | ORAL | 4 refills | Status: DC

## 2017-12-06 NOTE — Progress Notes (Signed)
Subjective:    Suzanne Bush is being seen today for her first obstetrical visit.  This is not a planned pregnancy. She is at [redacted]w[redacted]d gestation. Her obstetrical history is significant for obesity, smoker and previous C/S x 2. Relationship with FOB: unknown. Patient does intend to breast feed. Pregnancy history fully reviewed.  The information documented in the HPI was reviewed and verified.  Menstrual History: OB History    Gravida  6   Para  2   Term  2   Preterm  0   AB  3   Living  2     SAB  3   TAB  0   Ectopic  0   Multiple  0   Live Births  2        Obstetric Comments  05/2009: pLTCS for NRFHT. 7lbs 2oz. 1 layer chromic closure         Patient's last menstrual period was 08/19/2017.    Past Medical History:  Diagnosis Date  . ADHD   . Bipolar 1 disorder (HCC)   . Depression   . Hx of trichomoniasis   . Hypertension     Past Surgical History:  Procedure Laterality Date  . CESAREAN SECTION  2011  . CESAREAN SECTION N/A 09/20/2015   Procedure: CESAREAN SECTION;  Surgeon: Levie Heritage, DO;  Location: [facility] BIRTHING SUITES;  Service: Obstetrics;  Laterality: N/A;  . CHOLECYSTECTOMY  after baby was born     (Not in a hospital admission) No Known Allergies  Social History   Tobacco Use  . Smoking status: Current Every Day Smoker    Packs/day: 0.50    Types: Cigarettes  . Smokeless tobacco: Never Used  Substance Use Topics  . Alcohol use: No    History reviewed. No pertinent family history.   Review of Systems Constitutional: negative for weight loss Gastrointestinal: negative for vomiting Genitourinary:negative for genital lesions and vaginal discharge and dysuria Musculoskeletal:negative for back pain Behavioral/Psych: negative for abusive relationship, depression, illegal drug usage and tobacco use    Objective:    BP 99/67   Pulse 82   Wt 224 lb 6.4 oz (101.8 kg)   LMP 08/19/2017   BMI 37.34 kg/m  General Appearance:    Alert,  cooperative, no distress, appears stated age  Head:    Normocephalic, without obvious abnormality, atraumatic  Eyes:    PERRL, conjunctiva/corneas clear, EOM's intact, fundi    benign, both eyes  Ears:    Normal TM's and external ear canals, both ears  Nose:   Nares normal, septum midline, mucosa normal, no drainage    or sinus tenderness  Throat:   Lips, mucosa, and tongue normal; teeth and gums normal  Neck:   Supple, symmetrical, trachea midline, no adenopathy;    thyroid:  no enlargement/tenderness/nodules; no carotid   bruit or JVD  Back:     Symmetric, no curvature, ROM normal, no CVA tenderness  Lungs:     Clear to auscultation bilaterally, respirations unlabored  Chest Wall:    No tenderness or deformity   Heart:    Regular rate and rhythm, S1 and S2 normal, no murmur, rub   or gallop  Breast Exam:    No tenderness, masses, or nipple abnormality  Abdomen:     Soft, non-tender, bowel sounds active all four quadrants,    no masses, no organomegaly  Genitalia:    Normal female without lesion, discharge or tenderness  Extremities:   Extremities normal, atraumatic, no  cyanosis or edema  Pulses:   2+ and symmetric all extremities  Skin:   Skin color, texture, turgor normal, no rashes or lesions  Lymph nodes:   Cervical, supraclavicular, and axillary nodes normal  Neurologic:   CNII-XII intact, normal strength, sensation and reflexes    throughout      Lab Review Urine pregnancy test Labs reviewed yes Radiologic studies reviewed no  Assessment:    Pregnancy at [redacted]w[redacted]d weeks    Plan:     1. Encounter for supervision of normal pregnancy, antepartum, unspecified gravidity Rx: - Cytology - PAP - Cervicovaginal ancillary only - Culture, OB Urine - Cystic Fibrosis Mutation 97 - Hemoglobinopathy evaluation - Obstetric Panel, Including HIV - Genetic Screening - AFP, Serum, Open Spina Bifida - Prenat-FeAsp-Meth-FA-DHA w/o A (PRENATE PIXIE) 10-0.6-0.4-200 MG CAPS; Take 1 capsule  by mouth daily before breakfast.  Dispense: 90 capsule; Refill: 4 - [place] MFM OB COMP + 14 WK; Future  2. History of 2 cesarean sections  3. Obesity affecting pregnancy, antepartum  4. Bipolar 1 disorder (HCC) - stable, on meds.  Managed by PCP  5. Attention deficit hyperactivity disorder (ADHD), unspecified ADHD type - stable, on meds.  Managed by PCP  6. HTN (hypertension), benign - stable  Prenatal vitamins.  Counseling provided regarding continued use of seat belts, cessation of alcohol consumption, smoking or use of illicit drugs; infection precautions i.e., influenza/TDAP immunizations, toxoplasmosis,CMV, parvovirus, listeria and varicella; workplace safety, exercise during pregnancy; routine dental care, safe medications, sexual activity, hot tubs, saunas, pools, travel, caffeine use, fish and methlymercury, potential toxins, hair treatments, varicose veins Weight gain recommendations per IOM guidelines reviewed: underweight/BMI< 18.5--> gain 28 - 40 lbs; normal weight/BMI 18.5 - 24.9--> gain 25 - 35 lbs; overweight/BMI 25 - 29.9--> gain 15 - 25 lbs; obese/BMI >30->gain  11 - 20 lbs Problem list reviewed and updated. FIRST/CF mutation testing/NIPT/QUAD SCREEN/fragile X/Ashkenazi Jewish population testing/Spinal muscular atrophy discussed: requested. Role of ultrasound in pregnancy discussed; fetal survey: requested. Amniocentesis discussed: not indicated.  Meds ordered this encounter  Medications  . Prenat-FeAsp-Meth-FA-DHA w/o A (PRENATE PIXIE) 10-0.6-0.4-200 MG CAPS    Sig: Take 1 capsule by mouth daily before breakfast.    Dispense:  90 capsule    Refill:  4   Orders Placed This Encounter  Procedures  . Culture, OB Urine  . [place] MFM OB COMP + 14 WK    Standing Status:   Future    Standing Expiration Date:   02/06/2019    Order Specific Question:   Reason for Exam (SYMPTOM  OR DIAGNOSIS REQUIRED)    Answer:   Anatomy    Order Specific Question:   Preferred Imaging Location?     Answer:   [facility]-Ultrasound  . Cystic Fibrosis Mutation 97  . Hemoglobinopathy evaluation  . Obstetric Panel, Including HIV  . Genetic Screening  . AFP, Serum, Open Spina Bifida    Follow up in 4 weeks. 50% of 20 min visit spent on counseling and coordination of care.     Brock Bad MD 12-06-2017

## 2017-12-06 NOTE — Progress Notes (Signed)
Pt presents for New OB. She does complain of lower abdominal pain.

## 2017-12-07 LAB — CERVICOVAGINAL ANCILLARY ONLY
BACTERIAL VAGINITIS: POSITIVE — AB
CANDIDA VAGINITIS: POSITIVE — AB
TRICH (WINDOWPATH): NEGATIVE

## 2017-12-09 ENCOUNTER — Other Ambulatory Visit: Payer: Self-pay | Admitting: Obstetrics

## 2017-12-09 DIAGNOSIS — B9689 Other specified bacterial agents as the cause of diseases classified elsewhere: Secondary | ICD-10-CM

## 2017-12-09 DIAGNOSIS — B3731 Acute candidiasis of vulva and vagina: Secondary | ICD-10-CM

## 2017-12-09 DIAGNOSIS — N760 Acute vaginitis: Principal | ICD-10-CM

## 2017-12-09 DIAGNOSIS — B373 Candidiasis of vulva and vagina: Secondary | ICD-10-CM

## 2017-12-09 DIAGNOSIS — N3000 Acute cystitis without hematuria: Secondary | ICD-10-CM

## 2017-12-09 LAB — URINE CULTURE, OB REFLEX

## 2017-12-09 LAB — CULTURE, OB URINE

## 2017-12-09 MED ORDER — TERCONAZOLE 0.4 % VA CREA: 1.0000 | TOPICAL_CREAM | Freq: Every day | VAGINAL | 0 refills | Status: DC

## 2017-12-09 MED ORDER — TINIDAZOLE 500 MG PO TABS: 1000.0000 mg | ORAL_TABLET | Freq: Every day | ORAL | 2 refills | Status: DC

## 2017-12-09 MED ORDER — SULFAMETHOXAZOLE-TRIMETHOPRIM 800-160 MG PO TABS: 1.0000 | ORAL_TABLET | Freq: Two times a day (BID) | ORAL | 0 refills | Status: DC

## 2017-12-10 ENCOUNTER — Telehealth: Payer: Self-pay

## 2017-12-10 NOTE — Telephone Encounter (Signed)
Received call from "[facility]" [facility] regarding missing AFP clinical information. Info given and they will reprocess today.

## 2017-12-11 LAB — CYTOLOGY - PAP
Diagnosis: NEGATIVE
HPV (WINDOPATH): NOT DETECTED

## 2017-12-11 LAB — HEMOGLOBINOPATHY EVALUATION
HEMOGLOBIN A2 QUANTITATION: 2.3 % (ref 1.8–3.2)
HGB A: 97.7 % (ref 96.4–98.8)
HGB C: 0 %
HGB S: 0 %
HGB VARIANT: 0 %
Hemoglobin F Quantitation: 0 % (ref 0.0–2.0)

## 2017-12-11 LAB — OBSTETRIC PANEL, INCLUDING HIV
Antibody Screen: NEGATIVE
Basophils Absolute: 0 10*3/uL (ref 0.0–0.2)
Basos: 0 %
EOS (ABSOLUTE): 0.1 10*3/uL (ref 0.0–0.4)
EOS: 1 %
HEMOGLOBIN: 11.3 g/dL (ref 11.1–15.9)
HIV Screen 4th Generation wRfx: NONREACTIVE
Hematocrit: 34.4 % (ref 34.0–46.6)
Hepatitis B Surface Ag: NEGATIVE
Immature Grans (Abs): 0.1 10*3/uL (ref 0.0–0.1)
Immature Granulocytes: 1 %
Lymphocytes Absolute: 3.8 10*3/uL — ABNORMAL HIGH (ref 0.7–3.1)
Lymphs: 25 %
MCH: 27.2 pg (ref 26.6–33.0)
MCHC: 32.8 g/dL (ref 31.5–35.7)
MCV: 83 fL (ref 79–97)
MONOS ABS: 0.9 10*3/uL (ref 0.1–0.9)
Monocytes: 6 %
Neutrophils Absolute: 10.3 10*3/uL — ABNORMAL HIGH (ref 1.4–7.0)
Neutrophils: 67 %
Platelets: 322 10*3/uL (ref 150–450)
RBC: 4.16 x10E6/uL (ref 3.77–5.28)
RDW: 13.9 % (ref 12.3–15.4)
RH TYPE: POSITIVE
RPR Ser Ql: NONREACTIVE
Rubella Antibodies, IGG: 2.54 index (ref >0.99)
WBC: 15.1 10*3/uL — ABNORMAL HIGH (ref 3.4–10.8)

## 2017-12-14 LAB — CYSTIC FIBROSIS MUTATION 97: Interpretation: NOT DETECTED

## 2017-12-14 LAB — AFP, SERUM, OPEN SPINA BIFIDA
AFP MoM: 0.91
AFP Value: 24.5 ng/mL
GEST. AGE ON COLLECTION DATE: 15.6 wk
Maternal Age At EDD: 32.1 yr
OSBR RISK 1 IN: 10000
TEST RESULTS AFP: NEGATIVE
Weight: 224 [lb_av]

## 2017-12-24 ENCOUNTER — Encounter (HOSPITAL_COMMUNITY): Payer: Self-pay

## 2017-12-26 ENCOUNTER — Encounter (HOSPITAL_COMMUNITY): Payer: Self-pay | Admitting: *Deleted

## 2017-12-26 ENCOUNTER — Emergency Department (HOSPITAL_COMMUNITY): Payer: Medicaid Other

## 2017-12-26 ENCOUNTER — Emergency Department (HOSPITAL_COMMUNITY)
Admission: EM | Admit: 2017-12-26 | Discharge: 2017-12-26 | Disposition: A | Discharge status: Home / Self Care | Payer: Medicaid Other | Attending: Emergency Medicine | Admitting: Emergency Medicine

## 2017-12-26 ENCOUNTER — Inpatient Hospital Stay (HOSPITAL_COMMUNITY)
Admission: AD | Admit: 2017-12-26 | Discharge: 2017-12-26 | Disposition: A | Discharge status: Home / Self Care | Payer: Medicaid Other | Source: Ambulatory Visit | Attending: Obstetrics and Gynecology | Admitting: Obstetrics and Gynecology

## 2017-12-26 ENCOUNTER — Other Ambulatory Visit: Payer: Self-pay

## 2017-12-26 DIAGNOSIS — O99342 Other mental disorders complicating pregnancy, second trimester: Secondary | ICD-10-CM | POA: Insufficient documentation

## 2017-12-26 DIAGNOSIS — F17210 Nicotine dependence, cigarettes, uncomplicated: Secondary | ICD-10-CM | POA: Insufficient documentation

## 2017-12-26 DIAGNOSIS — O9A212 Injury, poisoning and certain other consequences of external causes complicating pregnancy, second trimester: Secondary | ICD-10-CM | POA: Insufficient documentation

## 2017-12-26 DIAGNOSIS — Y998 Other external cause status: Secondary | ICD-10-CM | POA: Diagnosis not present

## 2017-12-26 DIAGNOSIS — S29012A Strain of muscle and tendon of back wall of thorax, initial encounter: Secondary | ICD-10-CM | POA: Insufficient documentation

## 2017-12-26 DIAGNOSIS — Z9049 Acquired absence of other specified parts of digestive tract: Secondary | ICD-10-CM | POA: Diagnosis not present

## 2017-12-26 DIAGNOSIS — Z3492 Encounter for supervision of normal pregnancy, unspecified, second trimester: Secondary | ICD-10-CM

## 2017-12-26 DIAGNOSIS — M7918 Myalgia, other site: Secondary | ICD-10-CM | POA: Diagnosis not present

## 2017-12-26 DIAGNOSIS — O99332 Smoking (tobacco) complicating pregnancy, second trimester: Secondary | ICD-10-CM | POA: Diagnosis not present

## 2017-12-26 DIAGNOSIS — Z79899 Other long term (current) drug therapy: Secondary | ICD-10-CM | POA: Diagnosis not present

## 2017-12-26 DIAGNOSIS — R55 Syncope and collapse: Secondary | ICD-10-CM | POA: Diagnosis not present

## 2017-12-26 DIAGNOSIS — O9989 Other specified diseases and conditions complicating pregnancy, childbirth and the puerperium: Secondary | ICD-10-CM | POA: Diagnosis not present

## 2017-12-26 DIAGNOSIS — W19XXXA Unspecified fall, initial encounter: Secondary | ICD-10-CM

## 2017-12-26 DIAGNOSIS — F319 Bipolar disorder, unspecified: Secondary | ICD-10-CM | POA: Diagnosis not present

## 2017-12-26 DIAGNOSIS — S29019A Strain of muscle and tendon of unspecified wall of thorax, initial encounter: Secondary | ICD-10-CM

## 2017-12-26 DIAGNOSIS — Y9389 Activity, other specified: Secondary | ICD-10-CM | POA: Diagnosis not present

## 2017-12-26 DIAGNOSIS — Z3A18 18 weeks gestation of pregnancy: Secondary | ICD-10-CM

## 2017-12-26 DIAGNOSIS — O98512 Other viral diseases complicating pregnancy, second trimester: Secondary | ICD-10-CM | POA: Insufficient documentation

## 2017-12-26 DIAGNOSIS — Y92410 Unspecified street and highway as the place of occurrence of the external cause: Secondary | ICD-10-CM | POA: Insufficient documentation

## 2017-12-26 DIAGNOSIS — O34219 Maternal care for unspecified type scar from previous cesarean delivery: Secondary | ICD-10-CM | POA: Insufficient documentation

## 2017-12-26 DIAGNOSIS — J069 Acute upper respiratory infection, unspecified: Secondary | ICD-10-CM

## 2017-12-26 DIAGNOSIS — O26892 Other specified pregnancy related conditions, second trimester: Secondary | ICD-10-CM | POA: Insufficient documentation

## 2017-12-26 DIAGNOSIS — M549 Dorsalgia, unspecified: Secondary | ICD-10-CM | POA: Diagnosis present

## 2017-12-26 DIAGNOSIS — F909 Attention-deficit hyperactivity disorder, unspecified type: Secondary | ICD-10-CM | POA: Diagnosis not present

## 2017-12-26 DIAGNOSIS — O10012 Pre-existing essential hypertension complicating pregnancy, second trimester: Secondary | ICD-10-CM | POA: Diagnosis not present

## 2017-12-26 DIAGNOSIS — B349 Viral infection, unspecified: Secondary | ICD-10-CM | POA: Diagnosis not present

## 2017-12-26 LAB — CBC
HEMATOCRIT: 34.1 % — AB (ref 36.0–46.0)
Hemoglobin: 11.1 g/dL — ABNORMAL LOW (ref 12.0–15.0)
MCH: 28.1 pg (ref 26.0–34.0)
MCHC: 32.6 g/dL (ref 30.0–36.0)
MCV: 86.3 fL (ref 80.0–100.0)
PLATELETS: 322 10*3/uL (ref 150–400)
RBC: 3.95 MIL/uL (ref 3.87–5.11)
RDW: 14.5 % (ref 11.5–15.5)
WBC: 17.2 10*3/uL — ABNORMAL HIGH (ref 4.0–10.5)
nRBC: 0 % (ref 0.0–0.2)

## 2017-12-26 LAB — BASIC METABOLIC PANEL
ANION GAP: 9 (ref 5–15)
BUN: 11 mg/dL (ref 6–20)
CALCIUM: 9 mg/dL (ref 8.9–10.3)
CHLORIDE: 102 mmol/L (ref 98–111)
CO2: 23 mmol/L (ref 22–32)
Creatinine, Ser: 0.7 mg/dL (ref 0.44–1.00)
GFR calc non Af Amer: >60 mL/min (ref >60)
Glucose, Bld: 84 mg/dL (ref 70–99)
Potassium: 3.9 mmol/L (ref 3.5–5.1)
Sodium: 134 mmol/L — ABNORMAL LOW (ref 135–145)

## 2017-12-26 LAB — URINALYSIS, ROUTINE W REFLEX MICROSCOPIC
BILIRUBIN URINE: NEGATIVE
Glucose, UA: NEGATIVE mg/dL
Ketones, ur: NEGATIVE mg/dL
NITRITE: NEGATIVE
Protein, ur: NEGATIVE mg/dL
SPECIFIC GRAVITY, URINE: 1.026 (ref 1.005–1.030)
pH: 6 (ref 5.0–8.0)

## 2017-12-26 LAB — RAPID URINE DRUG SCREEN, HOSP PERFORMED
Amphetamines: NOT DETECTED
BARBITURATES: NOT DETECTED
Benzodiazepines: NOT DETECTED
Cocaine: POSITIVE — AB
OPIATES: NOT DETECTED
TETRAHYDROCANNABINOL: POSITIVE — AB

## 2017-12-26 MED ORDER — ACETAMINOPHEN 500 MG PO TABS
1000.0000 mg | ORAL_TABLET | Freq: Once | ORAL | Status: AC
  Administered 2017-12-26: 1000 mg via ORAL
  Filled 2017-12-26: qty 2

## 2017-12-26 NOTE — Discharge Instructions (Addendum)
It was our pleasure to provide your ER care today - we hope that you feel better.  Rest. Drink adequate fluids. Take acetaminophen as need for pain.  Follow up with primary care doctor, and ob/gyn doctor in the coming week.  Return to ER if worse, new or severe pain, other concern.
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
## 2017-12-26 NOTE — MAU Provider Note (Signed)
History     CSN: [id number]  Arrival date and time: 12/26/17 1241   First Provider Initiated Contact with Patient 12/26/17 1334      Chief Complaint  Patient presents with  . Fall  . Back Pain  . Shoulder Pain   [id number] @18 .3 wks here after syncope episode yesterday around 4pm. She was standing at the car when she felt hot and then leaned over, the next thing she remembers is waking up on the ground, she thinks she fell back. Unsure if she hit her head. The event was witnessed but she states the witnesses didn't given her anymore details. She was evaluated by EMS but refused transport because it was her sisters birthday. Prior to the episode it had been 5 hrs since she ate and she had no water intake, only juice and soda. She denies VB or abd pain. Reports bilateral shoulder pain, midline back pain, and right elbow pain today. Reports runny nose and productive cough (clear/white) since yesterday. Had upper chest tightness earlier today. No fevers. No body aches. No sick contacts. She admits to tobacco and marijuana use, and states marijuana use is safer than depression meds which she no longer takes. Last use 3 days ago.    OB History    Gravida  6   Para  2   Term  2   Preterm  0   AB  3   Living  2     SAB  3   TAB  0   Ectopic  0   Multiple  0   Live Births  2        Obstetric Comments  05/2009: pLTCS for NRFHT. 7lbs 2oz. 1 layer chromic closure        Past Medical History:  Diagnosis Date  . ADHD   . Bipolar 1 disorder (HCC)   . Depression   . Hx of trichomoniasis   . Hypertension     Past Surgical History:  Procedure Laterality Date  . CESAREAN SECTION  2011  . CESAREAN SECTION N/A 09/20/2015   Procedure: CESAREAN SECTION;  Surgeon: Levie Heritage, DO;  Location: [facility] BIRTHING SUITES;  Service: Obstetrics;  Laterality: N/A;  . CHOLECYSTECTOMY  after baby was born    History reviewed. No pertinent family history.  Social History   Tobacco Use   . Smoking status: Current Every Day Smoker    Packs/day: 0.50    Types: Cigarettes  . Smokeless tobacco: Never Used  Substance Use Topics  . Alcohol use: No  . Drug use: Yes    Frequency: 2.0 times per week    Types: Marijuana    Comment: Last smoked 12/24/2017    Allergies: No Known Allergies  Medications Prior to Admission  Medication Sig Dispense Refill Last Dose  . alprazolam (XANAX) 2 MG tablet Take 2 mg by mouth 2 (two) times daily.  1   . amphetamine-dextroamphetamine (ADDERALL) 30 MG tablet Take 30 mg by mouth daily.   Taking  . Prenat-FeAsp-Meth-FA-DHA w/o A (PRENATE PIXIE) 10-0.6-0.4-200 MG CAPS Take 1 capsule by mouth daily before breakfast. 90 capsule 4   . sulfamethoxazole-trimethoprim (BACTRIM DS,SEPTRA DS) 800-160 MG tablet Take 1 tablet by mouth 2 (two) times daily. 14 tablet 0   . terconazole (TERAZOL 7) 0.4 % vaginal cream Place 1 applicator vaginally at bedtime. 45 g 0   . tinidazole (TINDAMAX) 500 MG tablet Take 2 tablets (1,000 mg total) by mouth daily with breakfast. 10 tablet 2   .  VRAYLAR capsule Take 3 mg by mouth daily.  0 Taking    Review of Systems  Constitutional: Negative for chills and fever.  HENT: Positive for rhinorrhea. Negative for congestion and sore throat.   Respiratory: Positive for cough and chest tightness. Negative for shortness of breath.   Cardiovascular: Negative for chest pain.  Gastrointestinal: Negative for abdominal pain.  Genitourinary: Negative for vaginal bleeding.  Musculoskeletal: Positive for back pain.   Physical Exam   Blood pressure 120/77, pulse 83, temperature 98.3 F (36.8 C), temperature source Oral, resp. rate 18, weight 100 kg, last menstrual period 08/19/2017, SpO2 99 %.  Physical Exam  Nursing note and vitals reviewed. Constitutional: She is oriented to person, place, and time. She appears well-developed and well-nourished. No distress.  HENT:  Head: Normocephalic and atraumatic.  Neck: Normal range of  motion.  Cardiovascular: Normal rate, regular rhythm and normal heart sounds.  Respiratory: Effort normal and breath sounds normal. No respiratory distress. She has no wheezes. She has no rales.  GI: Soft. She exhibits no distension. There is no tenderness.  Musculoskeletal: Normal range of motion.       Right shoulder: Normal. She exhibits normal range of motion and no tenderness.       Left shoulder: Normal. She exhibits normal range of motion and no tenderness.       Right elbow: Normal.She exhibits normal range of motion, no swelling, no deformity and no laceration. No tenderness found.       Cervical back: Normal.       Thoracic back: Normal.       Lumbar back: Normal.  Neurological: She is alert and oriented to person, place, and time.  Skin: Skin is warm and dry.  Psychiatric: She has a normal mood and affect.  Unable to obtain FHT by doppler>limited bedside US: viable, active fetus, +cardiac activity, subj. nml AFV  Results for orders placed or performed during the hospital encounter of 12/26/17 (from the past 24 hour(s))  CBC     Status: Abnormal   Collection Time: 12/26/17  1:17 PM  Result Value Ref Range   WBC 17.2 (H) 4.0 - 10.5 K/uL   RBC 3.95 3.87 - 5.11 MIL/uL   Hemoglobin 11.1 (L) 12.0 - 15.0 g/dL   HCT 16.134.1 (L) 09.636.0 - 04.546.0 %   MCV 86.3 80.0 - 100.0 fL   MCH 28.1 26.0 - 34.0 pg   MCHC 32.6 30.0 - 36.0 g/dL   RDW 40.914.5 81.111.5 - 91.415.5 %   Platelets 322 150 - 400 K/uL   nRBC 0.0 0.0 - 0.2 %  Basic metabolic panel     Status: Abnormal   Collection Time: 12/26/17  1:17 PM  Result Value Ref Range   Sodium 134 (L) 135 - 145 mmol/L   Potassium 3.9 3.5 - 5.1 mmol/L   Chloride 102 98 - 111 mmol/L   CO2 23 22 - 32 mmol/L   Glucose, Bld 84 70 - 99 mg/dL   BUN 11 6 - 20 mg/dL   Creatinine, Ser 7.820.70 0.44 - 1.00 mg/dL   Calcium 9.0 8.9 - 95.610.3 mg/dL   GFR calc non Af Amer >60 >60 mL/min   GFR calc Af Amer >60 >60 mL/min   Anion gap 9 5 - 15  Urinalysis, Routine w reflex  microscopic     Status: Abnormal   Collection Time: 12/26/17  1:27 PM  Result Value Ref Range   Color, Urine YELLOW YELLOW   APPearance CLEAR CLEAR  Specific Gravity, Urine 1.026 1.005 - 1.030   pH 6.0 5.0 - 8.0   Glucose, UA NEGATIVE NEGATIVE mg/dL   Hgb urine dipstick SMALL (A) NEGATIVE   Bilirubin Urine NEGATIVE NEGATIVE   Ketones, ur NEGATIVE NEGATIVE mg/dL   Protein, ur NEGATIVE NEGATIVE mg/dL   Nitrite NEGATIVE NEGATIVE   Leukocytes, UA MODERATE (A) NEGATIVE   RBC / HPF 6-10 0 - 5 RBC/hpf   WBC, UA 11-20 0 - 5 WBC/hpf   Bacteria, UA FEW (A) NONE SEEN   Squamous Epithelial / LPF 0-5 0 - 5   Mucus PRESENT   Urine rapid drug screen (hosp performed)     Status: Abnormal   Collection Time: 12/26/17  1:27 PM  Result Value Ref Range   Opiates NONE DETECTED NONE DETECTED   Cocaine POSITIVE (A) NONE DETECTED   Benzodiazepines NONE DETECTED NONE DETECTED   Amphetamines NONE DETECTED NONE DETECTED   Tetrahydrocannabinol POSITIVE (A) NONE DETECTED   Barbiturates NONE DETECTED NONE DETECTED   MAU Course  Procedures  MDM Labs and EKG ordered. EKG interpreted by Dr. Alvester Morin normal. Syncope likely caused by dehydration and/or hypoglycemia. Discussed dietary modifications. Elevated WBC likely d/t resp virus, discussed OTC supportive measures. Recommend discontinuation of tobacco and marijuana d/t risks on pregnancy/fetus. UDS pending at time of discharge but +thc and cocaine. MSK pain can be treated with Tylenol and heat.   Assessment and Plan   1. [redacted] weeks gestation of pregnancy   2. Fall, initial encounter   3. Upper respiratory virus   4. Musculoskeletal pain    Discharge home Follow up in OB office next week as scheduled SAB precautions Tylenol and heat prn Mucinex and Sudafed OTC Hydrate with 6 bottles water per day Reduce sugar intake  Allergies as of 12/26/2017   No Known Allergies     Medication List    STOP taking these medications   alprazolam 2 MG  tablet Commonly known as:  XANAX   amphetamine-dextroamphetamine 30 MG tablet Commonly known as:  ADDERALL   sulfamethoxazole-trimethoprim 800-160 MG tablet Commonly known as:  BACTRIM DS,SEPTRA DS   terconazole 0.4 % vaginal cream Commonly known as:  TERAZOL 7   tinidazole 500 MG tablet Commonly known as:  TINDAMAX   VRAYLAR capsule Generic drug:  cariprazine     TAKE these medications   PRENATE PIXIE 10-0.6-0.4-200 MG Caps Take 1 capsule by mouth daily before breakfast.      Donette Larry, CNM 12/26/2017, 2:26 PM

## 2017-12-26 NOTE — MAU Note (Signed)
Fainted yesterday. Today has a dime sized bruise below rt elbow, her shoulder and her back is hurting.   Just wanted to make sure she and the baby are ok.

## 2017-12-26 NOTE — Discharge Instructions (Signed)
Upper Respiratory Infection, Adult Most upper respiratory infections (URIs) are caused by a virus. A URI affects the nose, throat, and upper air passages. The most common type of URI is often called "the common cold." Follow these instructions at home:  Take medicines only as told by your doctor.  Gargle warm saltwater or take cough drops to comfort your throat as told by your doctor.  Use a warm mist humidifier or inhale steam from a shower to increase air moisture. This may make it easier to breathe.  Drink enough fluid to keep your pee (urine) clear or pale yellow.  Eat soups and other clear broths.  Have a healthy diet.  Rest as needed.  Go back to work when your fever is gone or your doctor says it is okay. ? You may need to stay home longer to avoid giving your URI to others. ? You can also wear a face mask and wash your hands often to prevent spread of the virus.  Use your inhaler more if you have asthma.  Do not use any tobacco products, including cigarettes, chewing tobacco, or electronic cigarettes. If you need help quitting, ask your doctor. Contact a doctor if:  You are getting worse, not better.  Your symptoms are not helped by medicine.  You have chills.  You are getting more short of breath.  You have brown or red mucus.  You have yellow or brown discharge from your nose.  You have pain in your face, especially when you bend forward.  You have a fever.  You have puffy (swollen) neck glands.  You have pain while swallowing.  You have white areas in the back of your throat. Get help right away if:  You have very bad or constant: ? Headache. ? Ear pain. ? Pain in your forehead, behind your eyes, and over your cheekbones (sinus pain). ? Chest pain.  You have long-lasting (chronic) lung disease and any of the following: ? Wheezing. ? Long-lasting cough. ? Coughing up blood. ? A change in your usual mucus.  You have a stiff neck.  You have  changes in your: ? Vision. ? Hearing. ? Thinking. ? Mood. This information is not intended to replace advice given to you by your health care provider. Make sure you discuss any questions you have with your health care provider. Document Released: 07/05/2007 Document Revised: 09/19/2015 Document Reviewed: 04/23/2013 Elsevier Interactive Patient Education  2018 [employer]   Eating Plan for Pregnant Women While you are pregnant, your body will require additional nutrition to help support your growing baby. It is recommended that you consume:  150 additional calories each day during your first trimester.  300 additional calories each day during your second trimester.  300 additional calories each day during your third trimester.  Eating a healthy, well-balanced diet is very important for your health and for your baby's health. You also have a higher need for some vitamins and minerals, such as folic acid, calcium, iron, and vitamin D. What do I need to know about eating during pregnancy?  Do not try to lose weight or go on a diet during pregnancy.  Choose healthy, nutritious foods. Choose  of a sandwich with a glass of milk instead of a candy bar or a high-calorie sugar-sweetened beverage.  Limit your overall intake of foods that have "empty calories." These are foods that have little nutritional value, such as sweets, desserts, candies, sugar-sweetened beverages, and fried foods.  Eat a variety of foods, especially  fruits and vegetables.  Take a prenatal vitamin to help meet the additional needs during pregnancy, specifically for folic acid, iron, calcium, and vitamin D.  Remember to stay active. Ask your health care provider for exercise recommendations that are specific to you.  Practice good food safety and cleanliness, such as washing your hands before you eat and after you prepare raw meat. This helps to prevent foodborne illnesses, such as listeriosis, that can be very  dangerous for your baby. Ask your health care provider for more information about listeriosis. What does 150 extra calories look like? Healthy options for an additional 150 calories each day could be any of the following:  Plain low-fat yogurt (6-8 oz) with  cup of berries.  1 apple with 2 teaspoons of peanut butter.  Cut-up vegetables with  cup of hummus.  Low-fat chocolate milk (8 oz or 1 cup).  1 string cheese with 1 medium orange.   of a peanut butter and jelly sandwich on whole-wheat bread (1 tsp of peanut butter).  For 300 calories, you could eat two of those healthy options each day. What is a healthy amount of weight to gain? The recommended amount of weight for you to gain is based on your pre-pregnancy BMI. If your pre-pregnancy BMI was:  Less than 18 (underweight), you should gain 28-40 lb.  18-24.9 (normal), you should gain 25-35 lb.  25-29.9 (overweight), you should gain 15-25 lb.  Greater than 30 (obese), you should gain 11-20 lb.  What if I am having twins or multiples? Generally, pregnant women who will be having twins or multiples may need to increase their daily calories by 300-600 calories each day. The recommended range for total weight gain is 25-54 lb, depending on your pre-pregnancy BMI. Talk with your health care provider for specific guidance about additional nutritional needs, weight gain, and exercise during your pregnancy. What foods can I eat? Grains Any grains. Try to choose whole grains, such as whole-wheat bread, oatmeal, or brown rice. Vegetables Any vegetables. Try to eat a variety of colors and types of vegetables to get a full range of vitamins and minerals. Remember to wash your vegetables well before eating. Fruits Any fruits. Try to eat a variety of colors and types of fruit to get a full range of vitamins and minerals. Remember to wash your fruits well before eating. Meats and Other Protein Sources Lean meats, including chicken, [place],  fish, and lean cuts of beef, veal, or pork. Make sure that all meats are cooked to "well done." Tofu. Tempeh. Beans. Eggs. Peanut butter and other nut butters. Seafood, such as shrimp, crab, and lobster. If you choose fish, select types that are higher in omega-3 fatty acids, including salmon, herring, mussels, trout, sardines, and pollock. Make sure that all meats are cooked to food-safe temperatures. Dairy Pasteurized milk and milk alternatives. Pasteurized yogurt and pasteurized cheese. Cottage cheese. Sour cream. Beverages Water. Juices that contain 100% fruit juice or vegetable juice. Caffeine-free teas and decaffeinated coffee. Drinks that contain caffeine are okay to drink, but it is better to avoid caffeine. Keep your total caffeine intake to less than 200 mg each day (12 oz of coffee, tea, or soda) or as directed by your health care provider. Condiments Any pasteurized condiments. Sweets and Desserts Any sweets and desserts. Fats and Oils Any fats and oils. The items listed above may not be a complete list of recommended foods or beverages. Contact your dietitian for more options. What foods are not recommended? Vegetables  Unpasteurized (raw) vegetable juices. Fruits Unpasteurized (raw) fruit juices. Meats and Other Protein Sources Cured meats that have nitrates, such as bacon, salami, and hotdogs. Luncheon meats, bologna, or other deli meats (unless they are reheated until they are steaming hot). Refrigerated pate, meat spreads from a meat counter, smoked seafood that is found in the refrigerated section of a store. Raw fish, such as sushi or sashimi. High mercury content fish, such as tilefish, shark, swordfish, and king mackerel. Raw meats, such as tuna or beef tartare. Undercooked meats and poultry. Make sure that all meats are cooked to food-safe temperatures. Dairy Unpasteurized (raw) milk and any foods that have raw milk in them. Soft cheeses, such as feta, queso blanco, queso  fresco, Brie, Camembert cheeses, blue-veined cheeses, and Panela cheese (unless it is made with pasteurized milk, which must be stated on the label). Beverages Alcohol. Sugar-sweetened beverages, such as sodas, teas, or energy drinks. Condiments Homemade fermented foods and drinks, such as pickles, sauerkraut, or kombucha drinks. (Store-bought pasteurized versions of these are okay.) Other Salads that are made in the store, such as ham salad, chicken salad, egg salad, tuna salad, and seafood salad. The items listed above may not be a complete list of foods and beverages to avoid. Contact your dietitian for more information. This information is not intended to replace advice given to you by your health care provider. Make sure you discuss any questions you have with your health care provider. Document Released: 10/31/2013 Document Revised: 06/24/2015 Document Reviewed: 07/01/2013 Elsevier Interactive Patient Education  [employer].

## 2017-12-26 NOTE — Progress Notes (Addendum)
Attempted to doppler FHT, none heard.  Fabian NovemberM. Bhambri, CNM will perform bedside U/S.  FHT noted on U/S

## 2017-12-28 ENCOUNTER — Encounter: Payer: Self-pay | Admitting: Obstetrics and Gynecology

## 2017-12-28 DIAGNOSIS — F1910 Other psychoactive substance abuse, uncomplicated: Secondary | ICD-10-CM | POA: Insufficient documentation

## 2018-01-01 ENCOUNTER — Ambulatory Visit (HOSPITAL_COMMUNITY)
Admission: RE | Admit: 2018-01-01 | Discharge: 2018-01-01 | Disposition: A | Discharge status: Home / Self Care | Payer: Medicaid Other | Source: Ambulatory Visit | Attending: Obstetrics | Admitting: Obstetrics

## 2018-01-01 ENCOUNTER — Other Ambulatory Visit: Payer: Self-pay | Admitting: Obstetrics

## 2018-01-01 ENCOUNTER — Other Ambulatory Visit (HOSPITAL_COMMUNITY): Payer: Self-pay | Admitting: *Deleted

## 2018-01-01 DIAGNOSIS — Z3A18 18 weeks gestation of pregnancy: Secondary | ICD-10-CM | POA: Insufficient documentation

## 2018-01-01 DIAGNOSIS — Z3490 Encounter for supervision of normal pregnancy, unspecified, unspecified trimester: Secondary | ICD-10-CM

## 2018-01-01 DIAGNOSIS — Z362 Encounter for other antenatal screening follow-up: Secondary | ICD-10-CM

## 2018-01-01 DIAGNOSIS — O99212 Obesity complicating pregnancy, second trimester: Secondary | ICD-10-CM | POA: Insufficient documentation

## 2018-01-01 DIAGNOSIS — O99332 Smoking (tobacco) complicating pregnancy, second trimester: Secondary | ICD-10-CM | POA: Insufficient documentation

## 2018-01-01 DIAGNOSIS — O99322 Drug use complicating pregnancy, second trimester: Secondary | ICD-10-CM | POA: Diagnosis not present

## 2018-01-01 DIAGNOSIS — Z3687 Encounter for antenatal screening for uncertain dates: Secondary | ICD-10-CM | POA: Diagnosis not present

## 2018-01-01 DIAGNOSIS — Z363 Encounter for antenatal screening for malformations: Secondary | ICD-10-CM | POA: Diagnosis present

## 2018-01-01 DIAGNOSIS — F1910 Other psychoactive substance abuse, uncomplicated: Secondary | ICD-10-CM | POA: Diagnosis not present

## 2018-01-01 DIAGNOSIS — O4442 Low lying placenta NOS or without hemorrhage, second trimester: Secondary | ICD-10-CM | POA: Insufficient documentation

## 2018-01-01 DIAGNOSIS — O09292 Supervision of pregnancy with other poor reproductive or obstetric history, second trimester: Secondary | ICD-10-CM | POA: Insufficient documentation

## 2018-01-02 ENCOUNTER — Other Ambulatory Visit: Payer: Self-pay | Admitting: Obstetrics

## 2018-01-03 ENCOUNTER — Encounter: Payer: Self-pay | Admitting: Obstetrics

## 2018-01-03 ENCOUNTER — Ambulatory Visit (INDEPENDENT_AMBULATORY_CARE_PROVIDER_SITE_OTHER): Payer: Medicaid Other | Admitting: Obstetrics

## 2018-01-03 VITALS — BP 114/82 | HR 85 | Wt 223.0 lb

## 2018-01-03 DIAGNOSIS — Z3490 Encounter for supervision of normal pregnancy, unspecified, unspecified trimester: Secondary | ICD-10-CM

## 2018-01-03 DIAGNOSIS — F319 Bipolar disorder, unspecified: Secondary | ICD-10-CM

## 2018-01-03 DIAGNOSIS — Z3492 Encounter for supervision of normal pregnancy, unspecified, second trimester: Secondary | ICD-10-CM

## 2018-01-03 DIAGNOSIS — O99210 Obesity complicating pregnancy, unspecified trimester: Secondary | ICD-10-CM

## 2018-01-03 DIAGNOSIS — Z98891 History of uterine scar from previous surgery: Secondary | ICD-10-CM

## 2018-01-03 DIAGNOSIS — I10 Essential (primary) hypertension: Secondary | ICD-10-CM

## 2018-01-03 NOTE — Progress Notes (Signed)
Subjective:  Suzanne Bush is a 32 y.o. [id number] at 666w5d being seen today for ongoing prenatal care.  She is currently monitored for the following issues for this low-risk pregnancy and has Supervision of normal pregnancy in third trimester; Obesity affecting pregnancy in third trimester; BMI 40.0-44.9, adult (HCC); History of cesarean delivery; Gestational hypertension w/o significant proteinuria in 3rd trimester; Preeclampsia; Status post cesarean delivery; Adjustment disorder with disturbance of emotion; Encounter for supervision of normal pregnancy, unspecified, unspecified trimester; and Polysubstance abuse (HCC) on their problem list.  Patient reports no complaints.  Contractions: Not present. Vag. Bleeding: None.  Movement: Present. Denies leaking of fluid.   The following portions of the patient's history were reviewed and updated as appropriate: allergies, current medications, past family history, past medical history, past social history, past surgical history and problem list. Problem list updated.  Objective:   Vitals:   01/03/18 1019  BP: 114/82  Pulse: 85  Weight: 223 lb (101.2 kg)    Fetal Status: Fetal Heart Rate (bpm): 150   Movement: Present     General:  Alert, oriented and cooperative. Patient is in no acute distress.  Skin: Skin is warm and dry. No rash noted.   Cardiovascular: Normal heart rate noted  Respiratory: Normal respiratory effort, no problems with respiration noted  Abdomen: Soft, gravid, appropriate for gestational age. Pain/Pressure: Absent     Pelvic:  Cervical exam deferred        Extremities: Normal range of motion.  Edema: None  Mental Status: Normal mood and affect. Normal behavior. Normal judgment and thought content.   Urinalysis:      Assessment and Plan:  Pregnancy: [id number] at 786w5d  There are no diagnoses linked to this encounter. Preterm labor symptoms and general obstetric precautions including but not limited to vaginal bleeding,  contractions, leaking of fluid and fetal movement were reviewed in detail with the patient. Please refer to After Visit Summary for other counseling recommendations.  Return in about 4 weeks (around 01/31/2018) for ROB.   Brock BadHarper, Charles A, MD
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
## 2018-01-29 ENCOUNTER — Ambulatory Visit (HOSPITAL_COMMUNITY): Payer: Medicaid Other

## 2018-01-29 ENCOUNTER — Ambulatory Visit (HOSPITAL_COMMUNITY)
Admission: RE | Admit: 2018-01-29 | Discharge: 2018-01-29 | Disposition: A | Discharge status: Home / Self Care | Payer: Medicaid Other | Source: Ambulatory Visit | Attending: Obstetrics | Admitting: Obstetrics

## 2018-01-29 DIAGNOSIS — Z362 Encounter for other antenatal screening follow-up: Secondary | ICD-10-CM | POA: Insufficient documentation

## 2018-01-29 DIAGNOSIS — O99212 Obesity complicating pregnancy, second trimester: Secondary | ICD-10-CM | POA: Diagnosis not present

## 2018-01-29 DIAGNOSIS — O99332 Smoking (tobacco) complicating pregnancy, second trimester: Secondary | ICD-10-CM

## 2018-01-29 DIAGNOSIS — O4442 Low lying placenta NOS or without hemorrhage, second trimester: Secondary | ICD-10-CM

## 2018-01-29 DIAGNOSIS — O99322 Drug use complicating pregnancy, second trimester: Secondary | ICD-10-CM

## 2018-01-29 DIAGNOSIS — Z3A22 22 weeks gestation of pregnancy: Secondary | ICD-10-CM

## 2018-01-29 DIAGNOSIS — O09292 Supervision of pregnancy with other poor reproductive or obstetric history, second trimester: Secondary | ICD-10-CM

## 2018-01-31 ENCOUNTER — Encounter: Payer: Medicaid Other | Admitting: Obstetrics

## 2018-02-01 ENCOUNTER — Ambulatory Visit (INDEPENDENT_AMBULATORY_CARE_PROVIDER_SITE_OTHER): Payer: Medicaid Other | Admitting: Obstetrics

## 2018-02-01 VITALS — BP 111/74 | HR 96 | Wt 226.0 lb

## 2018-02-01 DIAGNOSIS — Z3492 Encounter for supervision of normal pregnancy, unspecified, second trimester: Secondary | ICD-10-CM

## 2018-02-01 DIAGNOSIS — Z3490 Encounter for supervision of normal pregnancy, unspecified, unspecified trimester: Secondary | ICD-10-CM

## 2018-02-01 DIAGNOSIS — F1911 Other psychoactive substance abuse, in remission: Secondary | ICD-10-CM

## 2018-02-01 DIAGNOSIS — Z98891 History of uterine scar from previous surgery: Secondary | ICD-10-CM

## 2018-02-01 DIAGNOSIS — O99210 Obesity complicating pregnancy, unspecified trimester: Secondary | ICD-10-CM

## 2018-02-01 DIAGNOSIS — M549 Dorsalgia, unspecified: Secondary | ICD-10-CM

## 2018-02-01 DIAGNOSIS — O99212 Obesity complicating pregnancy, second trimester: Secondary | ICD-10-CM

## 2018-02-01 MED ORDER — COMFORT FIT MATERNITY SUPP SM MISC: 0 refills | Status: DC

## 2018-02-01 NOTE — Progress Notes (Signed)
Subjective:  Suzanne Bush is a 33 y.o. [id number] at [redacted]w[redacted]d being seen today for ongoing prenatal care.  She is currently monitored for the following issues for this high-risk pregnancy and has Supervision of normal pregnancy in third trimester; Obesity affecting pregnancy in third trimester; BMI 40.0-44.9, adult (HCC); History of cesarean delivery; Gestational hypertension w/o significant proteinuria in 3rd trimester; Preeclampsia; Status post cesarean delivery; Adjustment disorder with disturbance of emotion; Encounter for supervision of normal pregnancy, unspecified, unspecified trimester; and Polysubstance abuse (HCC) on their problem list.  Patient reports backache and pelvic pressure.  Contractions: Not present. Vag. Bleeding: None.  Movement: Present. Denies leaking of fluid.   The following portions of the patient's history were reviewed and updated as appropriate: allergies, current medications, past family history, past medical history, past social history, past surgical history and problem list. Problem list updated.  Objective:   Vitals:   02/01/18 1029  BP: 111/74  Pulse: 96  Weight: 226 lb (102.5 kg)    Fetal Status:     Movement: Present     General:  Alert, oriented and cooperative. Patient is in no acute distress.  Skin: Skin is warm and dry. No rash noted.   Cardiovascular: Normal heart rate noted  Respiratory: Normal respiratory effort, no problems with respiration noted  Abdomen: Soft, gravid, appropriate for gestational age. Pain/Pressure: Present     Pelvic:  Cervical exam deferred        Extremities: Normal range of motion.     Mental Status: Normal mood and affect. Normal behavior. Normal judgment and thought content.   Urinalysis:      Assessment and Plan:  Pregnancy: [id number] at [redacted]w[redacted]d  1. Encounter for supervision of normal pregnancy, antepartum, unspecified gravidity  2. History of 2 cesarean sections  3. Obesity affecting pregnancy, antepartum  4.  History of substance abuse (HCC)  5. Backache symptom Rx: - Elastic Bandages & Supports (COMFORT FIT MATERNITY SUPP SM) MISC; Wear as directed.  Dispense: 1 each; Refill: 0   Preterm labor symptoms and general obstetric precautions including but not limited to vaginal bleeding, contractions, leaking of fluid and fetal movement were reviewed in detail with the patient. Please refer to After Visit Summary for other counseling recommendations.  Return in about 4 weeks (around 03/01/2018) for ROB.   Brock Bad, MD

## 2018-03-01 ENCOUNTER — Ambulatory Visit (INDEPENDENT_AMBULATORY_CARE_PROVIDER_SITE_OTHER): Payer: Medicaid Other | Admitting: Obstetrics

## 2018-03-01 ENCOUNTER — Other Ambulatory Visit: Payer: Self-pay

## 2018-03-01 ENCOUNTER — Encounter: Payer: Self-pay | Admitting: Obstetrics

## 2018-03-01 ENCOUNTER — Other Ambulatory Visit (HOSPITAL_COMMUNITY)
Admission: RE | Admit: 2018-03-01 | Discharge: 2018-03-01 | Disposition: A | Discharge status: Home / Self Care | Payer: Medicaid Other | Source: Ambulatory Visit | Attending: Obstetrics | Admitting: Obstetrics

## 2018-03-01 VITALS — BP 129/85 | HR 90 | Wt 224.0 lb

## 2018-03-01 DIAGNOSIS — N939 Abnormal uterine and vaginal bleeding, unspecified: Secondary | ICD-10-CM

## 2018-03-01 DIAGNOSIS — O99210 Obesity complicating pregnancy, unspecified trimester: Secondary | ICD-10-CM

## 2018-03-01 DIAGNOSIS — O2340 Unspecified infection of urinary tract in pregnancy, unspecified trimester: Secondary | ICD-10-CM

## 2018-03-01 DIAGNOSIS — Z3492 Encounter for supervision of normal pregnancy, unspecified, second trimester: Secondary | ICD-10-CM

## 2018-03-01 DIAGNOSIS — R300 Dysuria: Secondary | ICD-10-CM

## 2018-03-01 DIAGNOSIS — I10 Essential (primary) hypertension: Secondary | ICD-10-CM

## 2018-03-01 DIAGNOSIS — F1911 Other psychoactive substance abuse, in remission: Secondary | ICD-10-CM

## 2018-03-01 DIAGNOSIS — Z98891 History of uterine scar from previous surgery: Secondary | ICD-10-CM

## 2018-03-01 DIAGNOSIS — Z3490 Encounter for supervision of normal pregnancy, unspecified, unspecified trimester: Secondary | ICD-10-CM

## 2018-03-01 DIAGNOSIS — O2342 Unspecified infection of urinary tract in pregnancy, second trimester: Secondary | ICD-10-CM

## 2018-03-01 DIAGNOSIS — F319 Bipolar disorder, unspecified: Secondary | ICD-10-CM

## 2018-03-01 LAB — POCT URINALYSIS DIPSTICK OB
Bilirubin, UA: NEGATIVE
GLUCOSE, UA: NEGATIVE
Ketones, UA: POSITIVE
NITRITE UA: POSITIVE
PH UA: 7 (ref 5.0–8.0)
PROTEIN: NEGATIVE
RBC UA: POSITIVE
SPEC GRAV UA: 1.01 (ref 1.010–1.025)
UROBILINOGEN UA: 0.2 U/dL

## 2018-03-01 MED ORDER — NITROFURANTOIN MONOHYD MACRO 100 MG PO CAPS: 100.0000 mg | ORAL_CAPSULE | Freq: Two times a day (BID) | ORAL | 0 refills | Status: DC

## 2018-03-01 NOTE — Addendum Note (Signed)
Addended by: Coral Ceo A on: 03/01/2018 11:00 AM   Modules accepted: Orders

## 2018-03-01 NOTE — Progress Notes (Addendum)
Subjective:  Suzanne Bush is a 33 y.o. [id number] at [redacted]w[redacted]d being seen today for ongoing prenatal care.  She is currently monitored for the following issues for this high-risk pregnancy and has Supervision of normal pregnancy in third trimester; Obesity affecting pregnancy in third trimester; BMI 40.0-44.9, adult (HCC); History of cesarean delivery; Gestational hypertension w/o significant proteinuria in 3rd trimester; Preeclampsia; Status post cesarean delivery; Adjustment disorder with disturbance of emotion; Encounter for supervision of normal pregnancy, unspecified, unspecified trimester; and Polysubstance abuse (HCC) on their problem list.  Patient reports vaginal spotting x 1.  Contractions: Not present. Vag. Bleeding: Scant.  Movement: Present. Denies leaking of fluid.   The following portions of the patient's history were reviewed and updated as appropriate: allergies, current medications, past family history, past medical history, past social history, past surgical history and problem list. Problem list updated.  Objective:   Vitals:   03/01/18 1028  BP: 129/85  Pulse: 90  Weight: 224 lb (101.6 kg)    Fetal Status:     Movement: Present     General:  Alert, oriented and cooperative. Patient is in no acute distress.  Skin: Skin is warm and dry. No rash noted.   Cardiovascular: Normal heart rate noted  Respiratory: Normal respiratory effort, no problems with respiration noted  Abdomen: Soft, gravid, appropriate for gestational age. Pain/Pressure: Absent     Pelvic:  Cervical exam deferred        Extremities: Normal range of motion.     Mental Status: Normal mood and affect. Normal behavior. Normal judgment and thought content.   Urinalysis:      Assessment and Plan:  Pregnancy: [id number] at [redacted]w[redacted]d  1. Encounter for supervision of normal pregnancy, antepartum, unspecified gravidity Rx: - POC Urinalysis Dipstick OB:  Positives Nitrites   2. History of 2 cesarean sections  3.  Vaginal spotting Rx: - Cervicovaginal ancillary only( [place])  4. Urinary tract infection in mother during pregnancy, antepartum Rx: - nitrofurantoin, macrocrystal-monohydrate, (MACROBID) 100 MG capsule; Take 1 capsule (100 mg total) by mouth 2 (two) times daily.  Dispense: 14 capsule; Refill: 0 - Urine Culture  5. Obesity affecting pregnancy, antepartum  6. History of substance abuse (HCC) - clinically stable  7. HTN (hypertension), benign - clinically stable  8. Bipolar 1 disorder (HCC) - clinically stable   Preterm labor symptoms and general obstetric precautions including but not limited to vaginal bleeding, contractions, leaking of fluid and fetal movement were reviewed in detail with the patient. Please refer to After Visit Summary for other counseling recommendations.  Return in about 2 weeks (around 03/15/2018) for ROB, 2 hour OGTT.   Brock Bad, MD

## 2018-03-04 LAB — CERVICOVAGINAL ANCILLARY ONLY
Bacterial vaginitis: NEGATIVE
Candida vaginitis: NEGATIVE
Chlamydia: NEGATIVE
NEISSERIA GONORRHEA: NEGATIVE
TRICH (WINDOWPATH): NEGATIVE

## 2018-03-05 ENCOUNTER — Other Ambulatory Visit: Payer: Self-pay | Admitting: Obstetrics

## 2018-03-05 DIAGNOSIS — N390 Urinary tract infection, site not specified: Secondary | ICD-10-CM

## 2018-03-05 DIAGNOSIS — B9689 Other specified bacterial agents as the cause of diseases classified elsewhere: Secondary | ICD-10-CM

## 2018-03-05 DIAGNOSIS — B961 Klebsiella pneumoniae [K. pneumoniae] as the cause of diseases classified elsewhere: Principal | ICD-10-CM

## 2018-03-05 LAB — URINE CULTURE

## 2018-03-05 MED ORDER — SULFAMETHOXAZOLE-TRIMETHOPRIM 800-160 MG PO TABS: 1.0000 | ORAL_TABLET | Freq: Two times a day (BID) | ORAL | 0 refills | Status: DC

## 2018-03-15 ENCOUNTER — Ambulatory Visit (INDEPENDENT_AMBULATORY_CARE_PROVIDER_SITE_OTHER): Payer: Medicaid Other | Admitting: Obstetrics

## 2018-03-15 ENCOUNTER — Encounter: Payer: Self-pay | Admitting: Obstetrics

## 2018-03-15 ENCOUNTER — Other Ambulatory Visit: Payer: Medicaid Other

## 2018-03-15 VITALS — BP 109/74 | HR 99 | Wt 229.8 lb

## 2018-03-15 DIAGNOSIS — Z3483 Encounter for supervision of other normal pregnancy, third trimester: Secondary | ICD-10-CM | POA: Diagnosis not present

## 2018-03-15 DIAGNOSIS — Z23 Encounter for immunization: Secondary | ICD-10-CM | POA: Diagnosis not present

## 2018-03-15 DIAGNOSIS — Z98891 History of uterine scar from previous surgery: Secondary | ICD-10-CM

## 2018-03-15 NOTE — Progress Notes (Signed)
Subjective:  Suzanne Bush is a 33 y.o. [id number] at [redacted]w[redacted]d being seen today for ongoing prenatal care.  She is currently monitored for the following issues for this low-risk pregnancy and has Supervision of normal pregnancy in third trimester; Obesity affecting pregnancy in third trimester; BMI 40.0-44.9, adult (HCC); History of cesarean delivery; Gestational hypertension w/o significant proteinuria in 3rd trimester; Preeclampsia; Status post cesarean delivery; Adjustment disorder with disturbance of emotion; Encounter for supervision of normal pregnancy, unspecified, unspecified trimester; and Polysubstance abuse (HCC) on their problem list.  Patient reports no complaints.  Contractions: Not present. Vag. Bleeding: None.  Movement: Present. Denies leaking of fluid.   The following portions of the patient's history were reviewed and updated as appropriate: allergies, current medications, past family history, past medical history, past social history, past surgical history and problem list. Problem list updated.  Objective:   Vitals:   03/15/18 0910  BP: 109/74  Pulse: 99  Weight: 229 lb 12.8 oz (104.2 kg)    Fetal Status:     Movement: Present     General:  Alert, oriented and cooperative. Patient is in no acute distress.  Skin: Skin is warm and dry. No rash noted.   Cardiovascular: Normal heart rate noted  Respiratory: Normal respiratory effort, no problems with respiration noted  Abdomen: Soft, gravid, appropriate for gestational age. Pain/Pressure: Absent     Pelvic:  Cervical exam deferred        Extremities: Normal range of motion.  Edema: None  Mental Status: Normal mood and affect. Normal behavior. Normal judgment and thought content.   Urinalysis:      Assessment and Plan:  Pregnancy: [id number] at [redacted]w[redacted]d  1. Encounter for supervision of other normal pregnancy in third trimester Rx: - Tdap vaccine greater than or equal to 7yo IM - Glucose Tolerance, 2 Hours w/1 Hour - HIV  Antibody (routine testing w rflx) - RPR - CBC  2. History of 2 cesarean sections   Preterm labor symptoms and general obstetric precautions including but not limited to vaginal bleeding, contractions, leaking of fluid and fetal movement were reviewed in detail with the patient. Please refer to After Visit Summary for other counseling recommendations.  Return in about 2 weeks (around 03/29/2018) for ROB.   Brock Bad, MD

## 2018-03-15 NOTE — Progress Notes (Signed)
Pt presents for ROB and 2 gtt labs. Tdap given today Pt has questions about c/s.

## 2018-03-16 LAB — CBC
Hematocrit: 31 % — ABNORMAL LOW (ref 34.0–46.6)
Hemoglobin: 10.2 g/dL — ABNORMAL LOW (ref 11.1–15.9)
MCH: 27.3 pg (ref 26.6–33.0)
MCHC: 32.9 g/dL (ref 31.5–35.7)
MCV: 83 fL (ref 79–97)
Platelets: 347 10*3/uL (ref 150–450)
RBC: 3.73 x10E6/uL — ABNORMAL LOW (ref 3.77–5.28)
RDW: 12.9 % (ref 11.7–15.4)
WBC: 18.2 10*3/uL — ABNORMAL HIGH (ref 3.4–10.8)

## 2018-03-16 LAB — GLUCOSE TOLERANCE, 2 HOURS W/ 1HR
GLUCOSE, 1 HOUR: 103 mg/dL (ref 65–179)
GLUCOSE, FASTING: 80 mg/dL (ref 65–91)
Glucose, 2 hour: 71 mg/dL (ref 65–152)

## 2018-03-16 LAB — HIV ANTIBODY (ROUTINE TESTING W REFLEX): HIV SCREEN 4TH GENERATION: NONREACTIVE

## 2018-03-16 LAB — RPR: RPR: NONREACTIVE

## 2018-03-17 ENCOUNTER — Other Ambulatory Visit: Payer: Self-pay | Admitting: Obstetrics

## 2018-03-17 DIAGNOSIS — D508 Other iron deficiency anemias: Secondary | ICD-10-CM

## 2018-03-17 MED ORDER — FERROUS SULFATE 325 (65 FE) MG PO TABS: 325.0000 mg | ORAL_TABLET | Freq: Two times a day (BID) | ORAL | 5 refills | Status: DC

## 2018-03-19 ENCOUNTER — Telehealth: Payer: Self-pay

## 2018-03-19 NOTE — Telephone Encounter (Signed)
error 

## 2018-03-29 ENCOUNTER — Encounter: Payer: Medicaid Other | Admitting: Obstetrics

## 2018-04-01 ENCOUNTER — Telehealth: Payer: Self-pay | Admitting: Obstetrics

## 2018-04-01 ENCOUNTER — Encounter: Payer: Self-pay | Admitting: Obstetrics

## 2018-04-01 NOTE — Telephone Encounter (Signed)
Attempted to call to reschedule appointments, phone rang then went to busy.  Mailed letter to call office to reschedule.

## 2018-08-24 ENCOUNTER — Encounter (HOSPITAL_COMMUNITY): Payer: Self-pay

## 2018-12-01 IMAGING — US US OB COMP LESS 14 WK
1 series · 15 of 28 positions shown · non-contrast
Comparison: No prior scans from this gestation.

CLINICAL DATA: 30-year-old pregnant female presents with pelvic
pain. Quantitative beta HCG [DATE].

EDC by LMP: 10/26/2016, projecting to an expected gestational age of
5 weeks 6 days.
EXAM:
OBSTETRIC <14 WK US AND TRANSVAGINAL OB US
TECHNIQUE: Both transabdominal and transvaginal ultrasound examinations were
performed for complete evaluation of the gestation as well as the
maternal uterus, adnexal regions, and pelvic cul-de-sac.
Transvaginal technique was performed to assess early pregnancy.

[Series 1: us ob comp less 14 wk · 60 acquisitions, 15 frames shown]
[im 1/60]
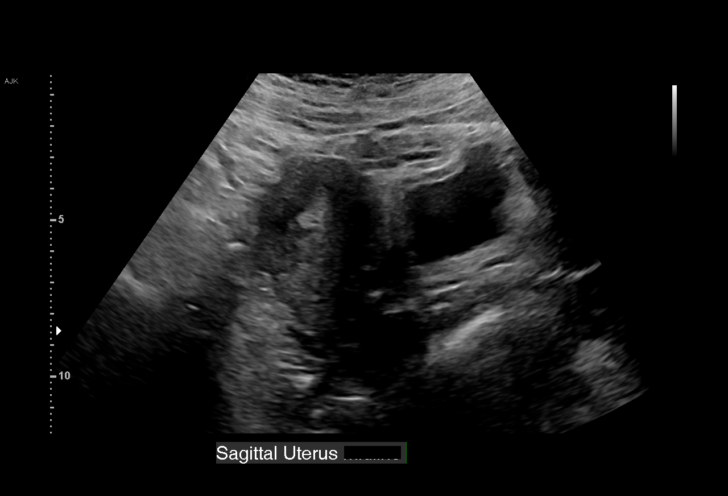
[im 5/60]
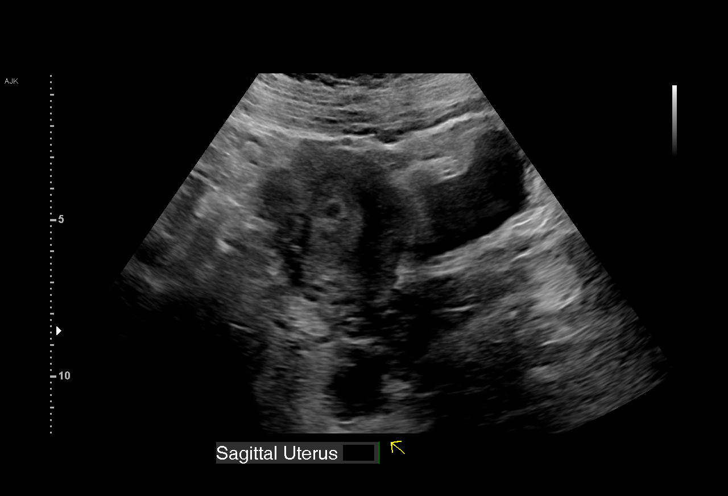
[im 9/60]
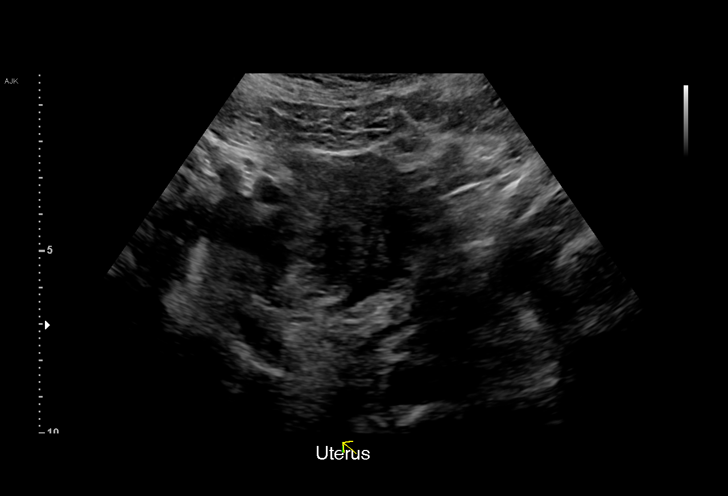
[im 14/60]
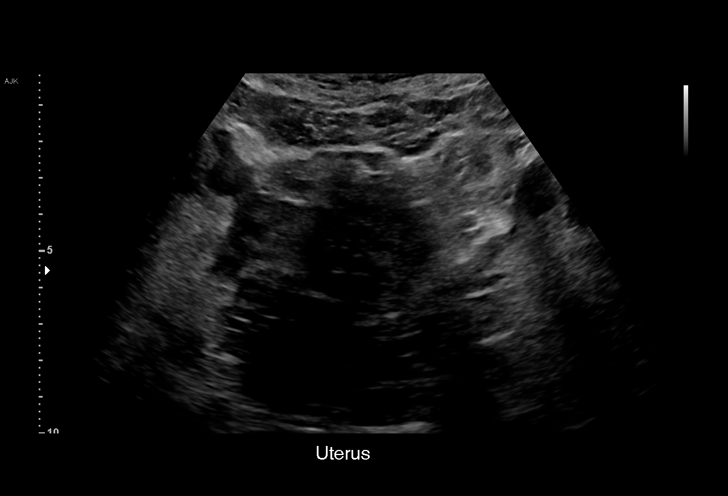
[im 18/60]
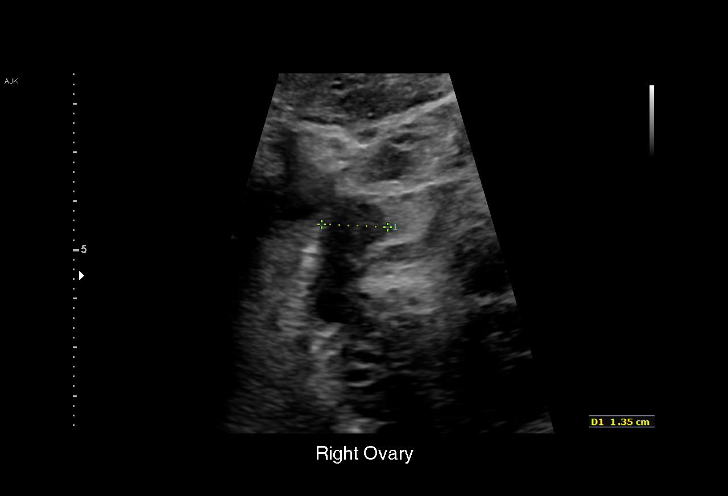
[im 22/60]
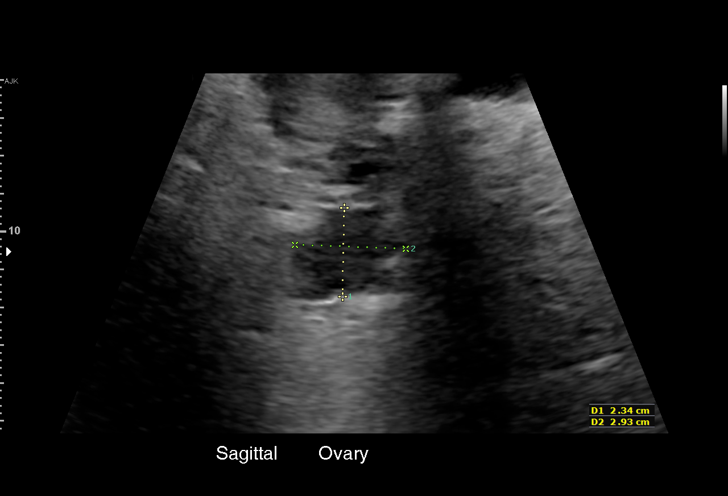
[im 27/60]
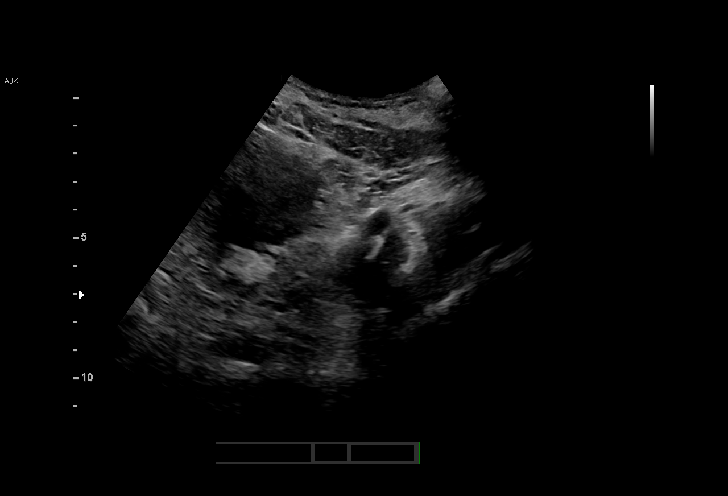
[im 31/60]
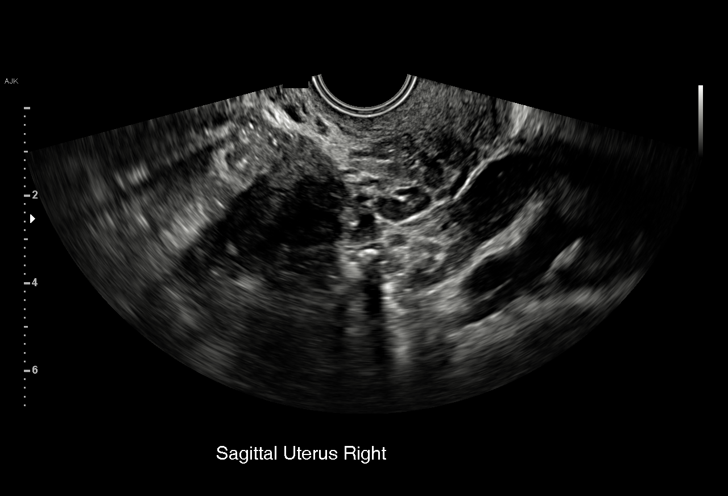
[im 33/60]
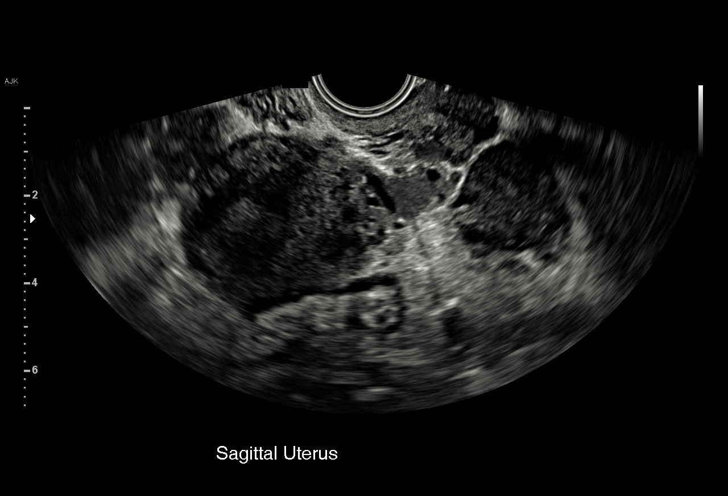
[im 38/60]
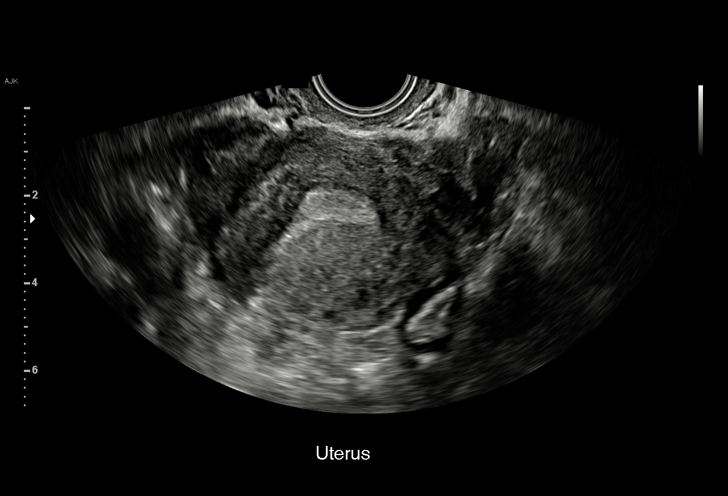
[im 42/60]
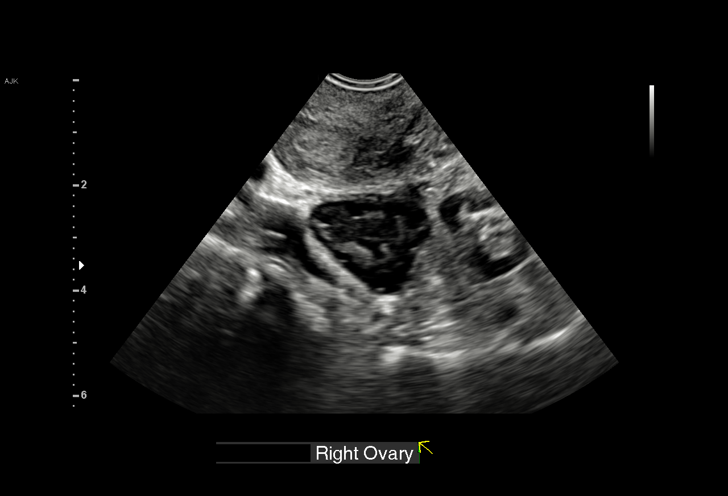
[im 46/60]
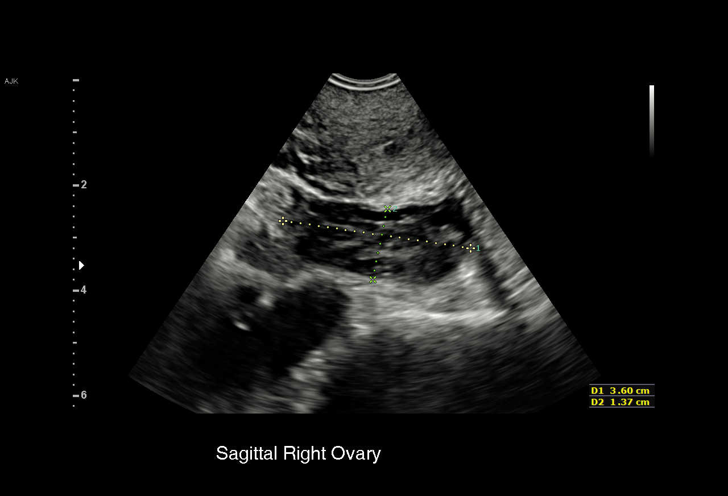
[im 51/60]
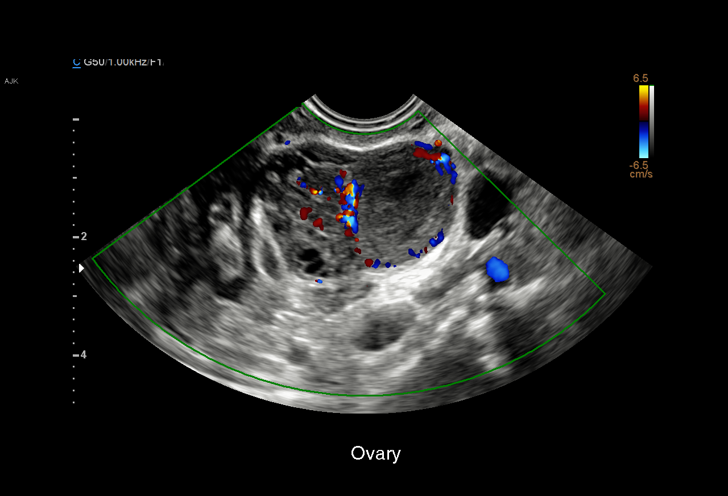
[im 55/60]
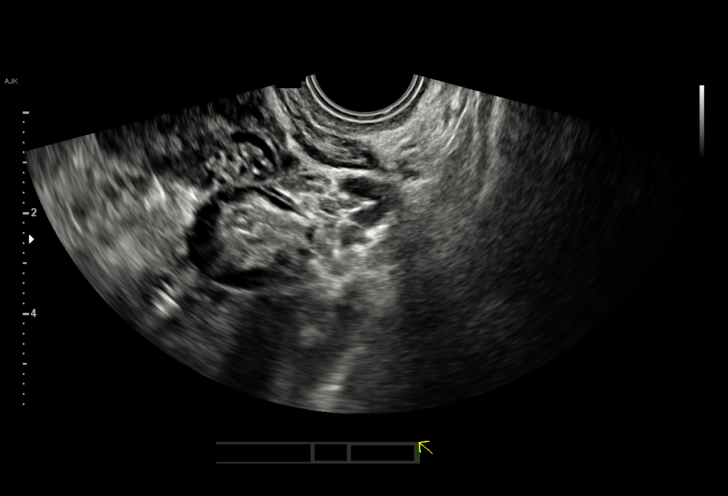
[im 60/60]
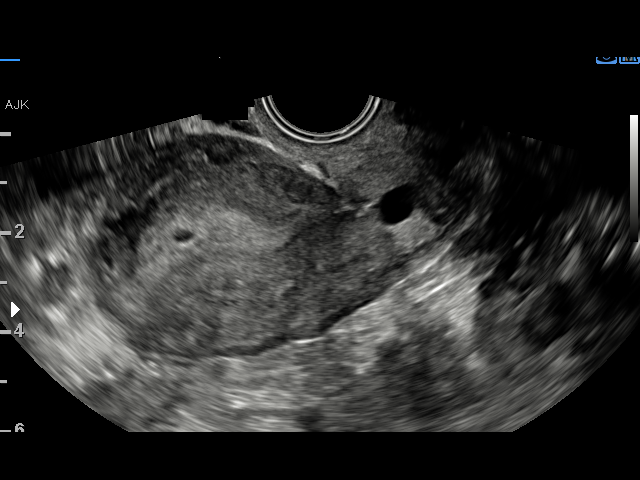

[15 of 28 positions shown; findings below may reference images not displayed]

FINDINGS: Intrauterine gestational sac: Single intrauterine gestational sac
appears normal in shape and position.

Yolk sac:  Visualized.

Embryo:  Not Visualized.

Embryonic Cardiac Activity: Not Visualized.

MSD: 5.4  mm   5 w   2  d

Subchorionic hemorrhage:  None visualized.

Maternal uterus/adnexae: Right ovary measures 3.6 x 1.4 x 2.4 cm.
Left ovary measures 2.7 x 2.6 x 3.5 cm and contains a corpus luteum.
No suspicious ovarian or adnexal masses. No abnormal free fluid in
the pelvis. No uterine fibroids are demonstrated.
IMPRESSION: 1. Single intrauterine gestational sac with yolk sac, at 5 weeks 2
days by mean sac diameter. No embryo visualized, which could be due
to early gestational age. A follow-up obstetric scan is recommended
in 11-14 days. This recommendation follows SRU consensus guidelines:
Diagnostic Criteria for Nonviable Pregnancy Early in the First
Trimester. N Engl J Med 5752; [DATE].
2. No ovarian or adnexal abnormality.

## 2019-01-08 ENCOUNTER — Ambulatory Visit: Payer: Medicaid Other | Admitting: Obstetrics

## 2019-01-08 ENCOUNTER — Other Ambulatory Visit: Payer: Self-pay

## 2019-01-08 ENCOUNTER — Other Ambulatory Visit (HOSPITAL_COMMUNITY)
Admission: RE | Admit: 2019-01-08 | Discharge: 2019-01-08 | Disposition: A | Discharge status: Home / Self Care | Payer: Medicaid Other | Source: Ambulatory Visit | Attending: Obstetrics | Admitting: Obstetrics

## 2019-01-08 ENCOUNTER — Encounter: Payer: Self-pay | Admitting: Obstetrics

## 2019-01-08 VITALS — BP 137/92 | HR 68 | Ht 65.0 in | Wt 214.0 lb

## 2019-01-08 DIAGNOSIS — Z113 Encounter for screening for infections with a predominantly sexual mode of transmission: Secondary | ICD-10-CM

## 2019-01-08 DIAGNOSIS — Z01419 Encounter for gynecological examination (general) (routine) without abnormal findings: Secondary | ICD-10-CM | POA: Insufficient documentation

## 2019-01-08 DIAGNOSIS — Z3202 Encounter for pregnancy test, result negative: Secondary | ICD-10-CM

## 2019-01-08 DIAGNOSIS — N898 Other specified noninflammatory disorders of vagina: Secondary | ICD-10-CM | POA: Insufficient documentation

## 2019-01-08 DIAGNOSIS — E669 Obesity, unspecified: Secondary | ICD-10-CM

## 2019-01-08 DIAGNOSIS — F17200 Nicotine dependence, unspecified, uncomplicated: Secondary | ICD-10-CM

## 2019-01-08 DIAGNOSIS — Z3009 Encounter for other general counseling and advice on contraception: Secondary | ICD-10-CM

## 2019-01-08 DIAGNOSIS — Z0000 Encounter for general adult medical examination without abnormal findings: Secondary | ICD-10-CM | POA: Diagnosis not present

## 2019-01-08 LAB — POCT URINE PREGNANCY: Preg Test, Ur: NEGATIVE

## 2019-01-08 MED ORDER — METRONIDAZOLE 500 MG PO TABS: 500.0000 mg | ORAL_TABLET | Freq: Two times a day (BID) | ORAL | 2 refills | Status: DC

## 2019-01-08 NOTE — Progress Notes (Signed)
Subjective:        Suzanne Bush is a 33 y.o. female here for a routine exam.  Current complaints: None.    Personal health questionnaire:  Is patient Ashkenazi Jewish, have a family history of breast and/or ovarian cancer: no Is there a family history of uterine cancer diagnosed at age < 32, gastrointestinal cancer, urinary tract cancer, family member who is a Personnel officer syndrome-associated carrier: no Is the patient overweight and hypertensive, family history of diabetes, personal history of gestational diabetes, preeclampsia or PCOS: no Is patient over 72, have PCOS,  family history of premature CHD under age 50, diabetes, smoke, have hypertension or peripheral artery disease:  no At any time, has a partner hit, kicked or otherwise hurt or frightened you?: no Over the past 2 weeks, have you felt down, depressed or hopeless?: no Over the past 2 weeks, have you felt little interest or pleasure in doing things?:no   Gynecologic History Patient's last menstrual period was 12/15/2018. Contraception: none Last Pap: 12-06-2017. Results were: normal Last mammogram: n/a. Results were: n/a  Obstetric History OB History  Gravida Para Term Preterm AB Living  6 2 2  0 3 2  SAB TAB Ectopic Multiple Live Births  3 0 0 0 2    # Outcome Date GA Lbr Len/2nd Weight Sex Delivery Anes PTL Lv  6 Gravida           5 Term 09/21/15 [redacted]w[redacted]d   M CS-LTranv Spinal  LIV     Complications: Hypertension in pregnancy, preeclampsia, severe, third trimester  4 Term 06/04/09    F CS-LTranv   LIV  3 SAB           2 SAB           1 SAB             Obstetric Comments  05/2009: pLTCS for NRFHT. 7lbs 2oz. 1 layer chromic closure    Past Medical History:  Diagnosis Date  . ADHD   . Bipolar 1 disorder (HCC)   . Depression   . Hx of trichomoniasis   . Hypertension     Past Surgical History:  Procedure Laterality Date  . CESAREAN SECTION  2011  . CESAREAN SECTION N/A 09/20/2015   Procedure: CESAREAN  SECTION;  Surgeon: 09/22/2015, DO;  Location: [facility] BIRTHING SUITES;  Service: Obstetrics;  Laterality: N/A;  . CHOLECYSTECTOMY  after baby was born     Current Outpatient Medications:  .  ferrous sulfate 325 (65 FE) MG tablet, Take 1 tablet (325 mg total) by mouth 2 (two) times daily with a meal. (Patient not taking: Reported on 01/08/2019), Disp: 60 tablet, Rfl: 5 .  metroNIDAZOLE (FLAGYL) 500 MG tablet, Take 1 tablet (500 mg total) by mouth 2 (two) times daily., Disp: 14 tablet, Rfl: 2 .  Prenat-FeAsp-Meth-FA-DHA w/o A (PRENATE PIXIE) 10-0.6-0.4-200 MG CAPS, Take 1 capsule by mouth daily before breakfast. (Patient not taking: Reported on 01/08/2019), Disp: 90 capsule, Rfl: 4 No Known Allergies  Social History   Tobacco Use  . Smoking status: Current Every Day Smoker    Packs/day: 0.50    Types: Cigarettes  . Smokeless tobacco: Never Used  Substance Use Topics  . Alcohol use: No    History reviewed. No pertinent family history.    Review of Systems  Constitutional: negative for fatigue and weight loss Respiratory: negative for cough and wheezing Cardiovascular: negative for chest pain, fatigue and palpitations Gastrointestinal: negative for abdominal pain  and change in bowel habits Musculoskeletal:negative for myalgias Neurological: negative for gait problems and tremors Behavioral/Psych: negative for abusive relationship, depression Endocrine: negative for temperature intolerance    Genitourinary:negative for abnormal menstrual periods, genital lesions, hot flashes, sexual problems and vaginal discharge Integument/breast: negative for breast lump, breast tenderness, nipple discharge and skin lesion(s)    Objective:       BP (!) 137/92   Pulse 68   Ht 5\' 5"  (1.651 m)   Wt 214 lb (97.1 kg)   LMP 12/15/2018   BMI 35.61 kg/m  General:   alert  Skin:   no rash or abnormalities  Lungs:   clear to auscultation bilaterally  Heart:   regular rate and rhythm, S1, S2 normal,  no murmur, click, rub or gallop  Breasts:   normal without suspicious masses, skin or nipple changes or axillary nodes  Abdomen:  normal findings: no organomegaly, soft, non-tender and no hernia  Pelvis:  External genitalia: normal general appearance Urinary system: urethral meatus normal and bladder without fullness, nontender Vaginal: normal without tenderness, induration or masses Cervix: normal appearance Adnexa: normal bimanual exam Uterus: anteverted and non-tender, normal size   Lab Review Urine pregnancy test Labs reviewed yes Radiologic studies reviewed no  50% of 25 min visit spent on counseling and coordination of care.   Assessment:     1. Encounter for routine gynecological examination with Papanicolaou smear of cervix Rx: - Cytology - PAP( [place])  2. Vaginal discharge Rx: - Cervicovaginal ancillary only( [place])  3. General counseling and advice on female contraception - wants Nexplanon  4. Tobacco dependence - tobacco cessation  5. Obesity (BMI 35.0-39.9 without comorbidity) - program of caloric restriction exercise and behavioral modification recommended  6. Screening for STD (sexually transmitted disease) - STD Panel drawn    Plan:    Follow up prn  Meds ordered this encounter  Medications  . metroNIDAZOLE (FLAGYL) 500 MG tablet    Sig: Take 1 tablet (500 mg total) by mouth 2 (two) times daily.    Dispense:  14 tablet    Refill:  2   Orders Placed This Encounter  Procedures  . HIV antibody  . Hepatitis B surface antigen  . RPR  . Hepatitis C antibody  . POCT urine pregnancy    Shelly Bombard, MD 01/08/2019 2:42 PM

## 2019-01-09 LAB — HEPATITIS C ANTIBODY: Hep C Virus Ab: <0.1 s/co ratio (ref 0.0–0.9)

## 2019-01-09 LAB — HIV ANTIBODY (ROUTINE TESTING W REFLEX): HIV Screen 4th Generation wRfx: NONREACTIVE

## 2019-01-09 LAB — RPR: RPR Ser Ql: NONREACTIVE

## 2019-01-09 LAB — HEPATITIS B SURFACE ANTIGEN: Hepatitis B Surface Ag: NEGATIVE

## 2019-01-10 LAB — CERVICOVAGINAL ANCILLARY ONLY
Bacterial Vaginitis (gardnerella): POSITIVE — AB
Candida Glabrata: NEGATIVE
Candida Vaginitis: NEGATIVE
Chlamydia: NEGATIVE
Comment: NEGATIVE
Comment: NEGATIVE
Comment: NEGATIVE
Comment: NEGATIVE
Comment: NEGATIVE
Comment: NORMAL
Neisseria Gonorrhea: NEGATIVE
Trichomonas: NEGATIVE

## 2019-01-10 LAB — CYTOLOGY - PAP
Comment: NEGATIVE
Diagnosis: NEGATIVE
High risk HPV: NEGATIVE

## 2019-01-15 ENCOUNTER — Other Ambulatory Visit: Payer: Self-pay | Admitting: Obstetrics

## 2019-01-20 ENCOUNTER — Ambulatory Visit: Payer: Medicaid Other | Admitting: Obstetrics

## 2019-02-03 ENCOUNTER — Ambulatory Visit: Payer: Medicaid Other | Admitting: Obstetrics

## 2019-02-07 ENCOUNTER — Ambulatory Visit: Payer: Medicaid Other | Admitting: Obstetrics

## 2019-02-14 ENCOUNTER — Ambulatory Visit: Payer: Medicaid Other | Admitting: Obstetrics

## 2019-02-28 ENCOUNTER — Ambulatory Visit: Payer: Medicaid Other

## 2019-02-28 ENCOUNTER — Other Ambulatory Visit (HOSPITAL_COMMUNITY)
Admission: RE | Admit: 2019-02-28 | Discharge: 2019-02-28 | Disposition: A | Discharge status: Home / Self Care | Payer: Medicaid Other | Source: Ambulatory Visit | Attending: Obstetrics and Gynecology | Admitting: Obstetrics and Gynecology

## 2019-02-28 ENCOUNTER — Other Ambulatory Visit: Payer: Self-pay

## 2019-02-28 DIAGNOSIS — N898 Other specified noninflammatory disorders of vagina: Secondary | ICD-10-CM

## 2019-02-28 NOTE — Progress Notes (Signed)
Patient seen and assessed by nursing staff during this encounter. I have reviewed the chart and agree with the documentation and plan.  Vonzella Nipple, PA-C 02/28/2019 1:26 PM

## 2019-02-28 NOTE — Progress Notes (Signed)
Pt is in the office for self swab for std testing, possible exposure to CH/GC.

## 2019-03-02 ENCOUNTER — Other Ambulatory Visit: Payer: Self-pay

## 2019-03-02 ENCOUNTER — Encounter (HOSPITAL_COMMUNITY): Payer: Self-pay

## 2019-03-02 ENCOUNTER — Ambulatory Visit (HOSPITAL_COMMUNITY)
Admission: EM | Admit: 2019-03-02 | Discharge: 2019-03-02 | Disposition: A | Discharge status: Home / Self Care | Payer: Medicaid Other | Attending: Urgent Care | Admitting: Urgent Care

## 2019-03-02 DIAGNOSIS — Z6841 Body Mass Index (BMI) 40.0 and over, adult: Secondary | ICD-10-CM | POA: Diagnosis not present

## 2019-03-02 DIAGNOSIS — F909 Attention-deficit hyperactivity disorder, unspecified type: Secondary | ICD-10-CM | POA: Insufficient documentation

## 2019-03-02 DIAGNOSIS — I10 Essential (primary) hypertension: Secondary | ICD-10-CM | POA: Diagnosis not present

## 2019-03-02 DIAGNOSIS — Z3202 Encounter for pregnancy test, result negative: Secondary | ICD-10-CM | POA: Diagnosis not present

## 2019-03-02 DIAGNOSIS — R112 Nausea with vomiting, unspecified: Secondary | ICD-10-CM | POA: Diagnosis present

## 2019-03-02 DIAGNOSIS — Z20822 Contact with and (suspected) exposure to covid-19: Secondary | ICD-10-CM | POA: Insufficient documentation

## 2019-03-02 DIAGNOSIS — F319 Bipolar disorder, unspecified: Secondary | ICD-10-CM | POA: Diagnosis not present

## 2019-03-02 DIAGNOSIS — E669 Obesity, unspecified: Secondary | ICD-10-CM | POA: Insufficient documentation

## 2019-03-02 DIAGNOSIS — F17210 Nicotine dependence, cigarettes, uncomplicated: Secondary | ICD-10-CM | POA: Insufficient documentation

## 2019-03-02 LAB — POCT URINALYSIS DIP (DEVICE)
Bilirubin Urine: NEGATIVE
Glucose, UA: NEGATIVE mg/dL
Ketones, ur: 15 mg/dL — AB
Leukocytes,Ua: NEGATIVE
Nitrite: NEGATIVE
Protein, ur: NEGATIVE mg/dL
Specific Gravity, Urine: 1.025 (ref 1.005–1.030)
Urobilinogen, UA: 0.2 mg/dL (ref 0.0–1.0)
pH: 7 (ref 5.0–8.0)

## 2019-03-02 LAB — POCT PREGNANCY, URINE: Preg Test, Ur: NEGATIVE

## 2019-03-02 LAB — POC URINE PREG, ED
Preg Test, Ur: NEGATIVE
Preg Test, Ur: NEGATIVE

## 2019-03-02 MED ORDER — ONDANSETRON 4 MG PO TBDP
4.0000 mg | ORAL_TABLET | Freq: Once | ORAL | Status: AC
  Administered 2019-03-02: 4 mg via ORAL

## 2019-03-02 MED ORDER — ONDANSETRON 4 MG PO TBDP
ORAL_TABLET | ORAL | Status: AC
  Filled 2019-03-02: qty 1

## 2019-03-02 MED ORDER — ONDANSETRON HCL 4 MG PO TABS: 4.0000 mg | ORAL_TABLET | Freq: Three times a day (TID) | ORAL | 0 refills | Status: DC | PRN

## 2019-03-02 NOTE — ED Triage Notes (Signed)
Patient presents to Urgent Care with complaints of emesis since this morning. Patient reports she did not have an appetite yesterday so today's emesis was mostly yellow bile. Pt endorses 10+ episodes of vomiting. Pt states she just finished her LMP.

## 2019-03-02 NOTE — ED Provider Notes (Signed)
[facility]    CSN: [id number] Arrival date & time: 03/02/19  1220      History   Chief Complaint Chief Complaint  Patient presents with   Emesis    HPI Suzanne Bush is a 34 y.o. female.   Patient reports to urgent care today for nausea and vomiting that started this morning. She has had atleast 10 episodes of vomiting and dry heaving today. The vomit is yellow, no blood. She has not eaten and foods since yesterday morning. She reports being busy at work and not eating during the day, and not having much appetite last night. She reports drinking "1 shot of alcohol". She denies abdominal pain. She is unsure of her menstrual status but states the last one was shorter. She is not on any form of birth control and does not use condoms. She denies fever, chills, headache, painful urination, frequent urination or urgency. Denies cough, congestion, shortness of breath.   She has a history of heart burn with pregnancy.      Past Medical History:  Diagnosis Date   ADHD    Bipolar 1 disorder ([place])    Depression    Hx of trichomoniasis    Hypertension     Patient Active Problem List   Diagnosis Date Noted   Polysubstance abuse ([place]) 12/28/2017   Encounter for supervision of normal pregnancy, unspecified, unspecified trimester 12/06/2017   Adjustment disorder with disturbance of emotion 02/09/2017   Status post cesarean delivery 09/21/2015   Preeclampsia 09/20/2015   Gestational hypertension w/o significant proteinuria in 3rd trimester 09/13/2015   Supervision of normal pregnancy in third trimester 08/26/2015   Obesity affecting pregnancy in third trimester 08/26/2015   BMI 40.0-44.9, adult ([place]) 08/26/2015   History of cesarean delivery 08/26/2015    Past Surgical History:  Procedure Laterality Date   CESAREAN SECTION  2011   CESAREAN SECTION N/A 09/20/2015   Procedure: CESAREAN SECTION;  Surgeon: Truett Mainland, DO;  Location: [facility];  Service: Obstetrics;  Laterality: N/A;   CHOLECYSTECTOMY  after baby was born    OB History    Gravida  6   Para  2   Term  2   Preterm  0   AB  3   Living  2     SAB  3   TAB  0   Ectopic  0   Multiple  0   Live Births  2        Obstetric Comments  05/2009: pLTCS for NRFHT. 7lbs 2oz. 1 layer chromic closure         Home Medications    Prior to Admission medications   Medication Sig Start Date End Date Taking? Authorizing Provider  ferrous sulfate 325 (65 FE) MG tablet Take 1 tablet (325 mg total) by mouth 2 (two) times daily with a meal. Patient not taking: Reported on 01/08/2019 03/17/18   Shelly Bombard, MD  metroNIDAZOLE (FLAGYL) 500 MG tablet Take 1 tablet (500 mg total) by mouth 2 (two) times daily. Patient not taking: Reported on 02/28/2019 01/08/19   Shelly Bombard, MD  ondansetron (ZOFRAN) 4 MG tablet Take 1 tablet (4 mg total) by mouth every 8 (eight) hours as needed for nausea or vomiting. 03/02/19   Maygan Koeller, Marguerita Beards, PA-C  Prenat-FeAsp-Meth-FA-DHA w/o A (PRENATE PIXIE) 10-0.6-0.4-200 MG CAPS Take 1 capsule by mouth daily before breakfast. Patient not taking: Reported on 01/08/2019 12/06/17   Shelly Bombard, MD  Family History Family History  Problem Relation Age of Onset   Hypertension Mother     Social History Social History   Tobacco Use   Smoking status: Current Every Day Smoker    Packs/day: 0.50    Types: Cigarettes   Smokeless tobacco: Never Used  Substance Use Topics   Alcohol use: Yes    Comment: socially   Drug use: Not Currently    Frequency: 2.0 times per week    Types: Marijuana    Comment: Last smoked 12/24/2017     Allergies   Patient has no known allergies.   Review of Systems Review of Systems  Constitutional: Positive for appetite change. Negative for chills, fatigue and fever.  HENT: Negative for congestion, ear pain and sore throat.   Eyes: Negative for pain and visual disturbance.   Respiratory: Negative for cough and shortness of breath.   Cardiovascular: Negative for chest pain and palpitations.  Gastrointestinal: Positive for nausea and vomiting. Negative for abdominal distention, abdominal pain, constipation and diarrhea.  Genitourinary: Negative for dysuria, flank pain, frequency and hematuria.  Musculoskeletal: Negative for arthralgias, back pain and myalgias.  Skin: Negative for color change and rash.  Neurological: Negative for seizures, syncope and headaches.  All other systems reviewed and are negative.    Physical Exam Triage Vital Signs ED Triage Vitals  Enc Vitals Group     BP 03/02/19 1247 (!) 147/98     Pulse Rate 03/02/19 1247 71     Resp 03/02/19 1247 17     Temp 03/02/19 1247 97.9 F (36.6 C)     Temp Source 03/02/19 1247 Oral     SpO2 03/02/19 1247 99 %     Weight --      Height --      Head Circumference --      Peak Flow --      Pain Score 03/02/19 1245 0     Pain Loc --      Pain Edu? --      Excl. in GC? --    No data found.  Updated Vital Signs BP (!) 147/98 (BP Location: Left Arm)    Pulse 71    Temp 97.9 F (36.6 C) (Oral)    Resp 17    SpO2 99%   Visual Acuity Right Eye Distance:   Left Eye Distance:   Bilateral Distance:    Right Eye Near:   Left Eye Near:    Bilateral Near:     Physical Exam Vitals and nursing note reviewed.  Constitutional:      General: She is not in acute distress.    Appearance: She is well-developed. She is obese. She is ill-appearing.  HENT:     Head: Normocephalic and atraumatic.  Eyes:     General: No scleral icterus.    Extraocular Movements: Extraocular movements intact.     Conjunctiva/sclera: Conjunctivae normal.     Pupils: Pupils are equal, round, and reactive to light.  Cardiovascular:     Rate and Rhythm: Normal rate and regular rhythm.     Heart sounds: No murmur.  Pulmonary:     Effort: Pulmonary effort is normal. No respiratory distress.     Breath sounds: Normal  breath sounds.  Abdominal:     Palpations: Abdomen is soft.     Tenderness: There is no abdominal tenderness. There is no right CVA tenderness or left CVA tenderness.  Musculoskeletal:     Cervical back: Neck supple.  Right lower leg: No edema.     Left lower leg: No edema.  Skin:    General: Skin is warm and dry.     Capillary Refill: Capillary refill takes less than 2 seconds.     Findings: No rash.  Neurological:     General: No focal deficit present.     Mental Status: She is alert and oriented to person, place, and time.  Psychiatric:        Mood and Affect: Mood normal.        Behavior: Behavior normal.        Thought Content: Thought content normal.        Judgment: Judgment normal.      UC Treatments / Results  Labs (all labs ordered are listed, but only abnormal results are displayed) Labs Reviewed  POCT URINALYSIS DIP (DEVICE) - Abnormal; Notable for the following components:      Result Value   Ketones, ur 15 (*)    Hgb urine dipstick SMALL (*)    All other components within normal limits  NOVEL CORONAVIRUS, NAA (HOSP ORDER, SEND-OUT TO REF LAB; TAT 18-24 HRS)  POC URINE PREG, ED  POCT PREGNANCY, URINE  POC URINE PREG, ED    EKG   Radiology No results found.  Procedures Procedures (including critical care time)  Medications Ordered in UC Medications  ondansetron (ZOFRAN-ODT) disintegrating tablet 4 mg (4 mg Oral Given 03/02/19 1255)    Initial Impression / Assessment and Plan / UC Course  I have reviewed the triage vital signs and the nursing notes.  Pertinent labs & imaging results that were available during my care of the patient were reviewed by me and considered in my medical decision making (see chart for details).     #nausea and vomiting - no other symptoms. UA negative , pregnancy negative. COVID PCR sent. zofran in clinic, able to keep down water. Zofran sent. Encouraged PO intake. Follow up and ED precautions discussed.   Final  Clinical Impressions(s) / UC Diagnoses   Final diagnoses:  Non-intractable vomiting with nausea, unspecified vomiting type     Discharge Instructions     Take the zofran every 8 hours as needed for nausea and vomiting  Drink plenty of water and include gatorade or pedialyte  Eat small bland meals, such as toast, crackers and unseasoned meats.  If vomiting continues despite treatment and you are unable to keep water down, please go to the Emergency department  Go directly to the Emergency Department or call 911 if you have severe chest pain, shortness of breath, severe diarrhea or feel as though you might pass out.   If your Covid-19 test is positive, you will receive a phone call from [facility] regarding your results. Negative test results are not called. Both positive and negative results area always visible on MyChart. If you do not have a MyChart account, sign up instructions are in your discharge papers.   Persons who are directed to care for themselves at home may discontinue isolation under the following conditions:   At least 10 days have passed since symptom onset and  At least 24 hours have passed without running a fever (this means without the use of fever-reducing medications) and  Other symptoms have improved.  Persons infected with COVID-19 who never develop symptoms may discontinue isolation and other precautions 10 days after the date of their first positive COVID-19 test.     ED Prescriptions    Medication Sig Dispense Auth.  Provider   ondansetron (ZOFRAN) 4 MG tablet Take 1 tablet (4 mg total) by mouth every 8 (eight) hours as needed for nausea or vomiting. 4 tablet Charnele Semple, Veryl Speak, PA-C     PDMP not reviewed this encounter.   Hermelinda Medicus, PA-C 03/02/19 1329

## 2019-03-02 NOTE — Discharge Instructions (Addendum)
Take the zofran every 8 hours as needed for nausea and vomiting  Drink plenty of water and include gatorade or pedialyte  Eat small bland meals, such as toast, crackers and unseasoned meats.  If vomiting continues despite treatment and you are unable to keep water down, please go to the Emergency department  Go directly to the Emergency Department or call 911 if you have severe chest pain, shortness of breath, severe diarrhea or feel as though you might pass out.   If your Covid-19 test is positive, you will receive a phone call from [facility] regarding your results. Negative test results are not called. Both positive and negative results area always visible on MyChart. If you do not have a MyChart account, sign up instructions are in your discharge papers.   Persons who are directed to care for themselves at home may discontinue isolation under the following conditions:   At least 10 days have passed since symptom onset and  At least 24 hours have passed without running a fever (this means without the use of fever-reducing medications) and  Other symptoms have improved.  Persons infected with COVID-19 who never develop symptoms may discontinue isolation and other precautions 10 days after the date of their first positive COVID-19 test.

## 2019-03-03 ENCOUNTER — Ambulatory Visit: Payer: Medicaid Other | Admitting: Obstetrics

## 2019-03-03 ENCOUNTER — Encounter: Payer: Self-pay | Admitting: Obstetrics

## 2019-03-03 ENCOUNTER — Other Ambulatory Visit: Payer: Self-pay

## 2019-03-03 VITALS — BP 141/93 | HR 67 | Ht 65.0 in | Wt 207.7 lb

## 2019-03-03 DIAGNOSIS — Z30017 Encounter for initial prescription of implantable subdermal contraceptive: Secondary | ICD-10-CM | POA: Diagnosis not present

## 2019-03-03 DIAGNOSIS — Z202 Contact with and (suspected) exposure to infections with a predominantly sexual mode of transmission: Secondary | ICD-10-CM | POA: Diagnosis not present

## 2019-03-03 DIAGNOSIS — Z3202 Encounter for pregnancy test, result negative: Secondary | ICD-10-CM

## 2019-03-03 DIAGNOSIS — N898 Other specified noninflammatory disorders of vagina: Secondary | ICD-10-CM

## 2019-03-03 DIAGNOSIS — B379 Candidiasis, unspecified: Secondary | ICD-10-CM

## 2019-03-03 LAB — POCT URINE PREGNANCY: Preg Test, Ur: NEGATIVE

## 2019-03-03 LAB — CERVICOVAGINAL ANCILLARY ONLY
Bacterial Vaginitis (gardnerella): POSITIVE — AB
Candida Glabrata: NEGATIVE
Candida Vaginitis: POSITIVE — AB
Chlamydia: POSITIVE — AB
Comment: NEGATIVE
Comment: NEGATIVE
Comment: NEGATIVE
Comment: NEGATIVE
Comment: NEGATIVE
Comment: NORMAL
Neisseria Gonorrhea: NEGATIVE
Trichomonas: POSITIVE — AB

## 2019-03-03 LAB — NOVEL CORONAVIRUS, NAA (HOSP ORDER, SEND-OUT TO REF LAB; TAT 18-24 HRS): SARS-CoV-2, NAA: NOT DETECTED

## 2019-03-03 MED ORDER — ETONOGESTREL 68 MG ~~LOC~~ IMPL
68.0000 mg | DRUG_IMPLANT | Freq: Once | SUBCUTANEOUS | Status: AC
  Administered 2019-03-03: 15:00:00 68 mg via SUBCUTANEOUS

## 2019-03-03 MED ORDER — CEFIXIME 400 MG PO CAPS: 400.0000 mg | ORAL_CAPSULE | Freq: Once | ORAL | 0 refills | Status: AC

## 2019-03-03 MED ORDER — FLUCONAZOLE 150 MG PO TABS: 150.0000 mg | ORAL_TABLET | Freq: Once | ORAL | 0 refills | Status: AC

## 2019-03-03 MED ORDER — METRONIDAZOLE 500 MG PO TABS: 500.0000 mg | ORAL_TABLET | Freq: Two times a day (BID) | ORAL | 2 refills | Status: DC

## 2019-03-03 MED ORDER — AZITHROMYCIN 500 MG PO TABS
1000.0000 mg | ORAL_TABLET | Freq: Once | ORAL | Status: AC
  Administered 2019-03-03: 1000 mg via ORAL

## 2019-03-03 NOTE — Progress Notes (Signed)
Nexplanon Procedure Note   PRE-OP DIAGNOSIS: Desired Long-Acting, Reversible Contraception ( LARC ) POST-OP DIAGNOSIS: Same  PROCEDURE: Nexplanon  placement Performing Provider: Brock Bad MD  Patient education prior to procedure, explained risk, benefits of Nexplanon, reviewed alternative options. Patient reported understanding. Gave consent to continue with procedure.   PROCEDURE:  Pregnancy Text :  Negative Site (check):      left arm         Sterile Preparation:   Betadinex3 Lot # [id number]  Expiration Date:  2023 JAN 07  Insertion site was selected 8 - 10 cm from medial epicondyle and marked along with guiding site using sterile marker. Procedure area was prepped and draped in a sterile fashion. 1% Lidocaine 1.5 ml given prior to procedure. Nexplanon  was inserted subcutaneously.Needle was removed from the insertion site. Nexplanon capsule was palpated by provider and patient to assure satisfactory placement. Dressing applied.  Followup: The patient tolerated the procedure well without complications.  Standard post-procedure care is explained and return precautions are given.  Brock Bad MD 03/03/2019

## 2019-03-03 NOTE — Progress Notes (Signed)
Patient is in the office for nexplanon insertion.

## 2019-03-12 ENCOUNTER — Encounter (HOSPITAL_COMMUNITY): Payer: Self-pay

## 2019-03-12 ENCOUNTER — Ambulatory Visit (HOSPITAL_COMMUNITY)
Admission: EM | Admit: 2019-03-12 | Discharge: 2019-03-12 | Disposition: A | Discharge status: Home / Self Care | Payer: Medicaid Other | Attending: Urgent Care | Admitting: Urgent Care

## 2019-03-12 ENCOUNTER — Other Ambulatory Visit: Payer: Self-pay

## 2019-03-12 DIAGNOSIS — Z202 Contact with and (suspected) exposure to infections with a predominantly sexual mode of transmission: Secondary | ICD-10-CM | POA: Insufficient documentation

## 2019-03-12 DIAGNOSIS — R1011 Right upper quadrant pain: Secondary | ICD-10-CM | POA: Insufficient documentation

## 2019-03-12 DIAGNOSIS — Z3202 Encounter for pregnancy test, result negative: Secondary | ICD-10-CM | POA: Diagnosis not present

## 2019-03-12 LAB — POCT URINALYSIS DIP (DEVICE)
Bilirubin Urine: NEGATIVE
Glucose, UA: NEGATIVE mg/dL
Ketones, ur: NEGATIVE mg/dL
Leukocytes,Ua: NEGATIVE
Nitrite: NEGATIVE
Protein, ur: NEGATIVE mg/dL
Specific Gravity, Urine: >=1.03 (ref 1.005–1.030)
Urobilinogen, UA: 0.2 mg/dL (ref 0.0–1.0)
pH: 6 (ref 5.0–8.0)

## 2019-03-12 LAB — POC URINE PREG, ED
Preg Test, Ur: NEGATIVE
Preg Test, Ur: NEGATIVE

## 2019-03-12 LAB — POCT PREGNANCY, URINE: Preg Test, Ur: NEGATIVE

## 2019-03-12 MED ORDER — AZITHROMYCIN 250 MG PO TABS
ORAL_TABLET | ORAL | Status: AC
  Filled 2019-03-12: qty 4

## 2019-03-12 MED ORDER — NAPROXEN 500 MG PO TABS: 500.0000 mg | ORAL_TABLET | Freq: Two times a day (BID) | ORAL | 0 refills | Status: DC

## 2019-03-12 MED ORDER — TIZANIDINE HCL 4 MG PO TABS: 4.0000 mg | ORAL_TABLET | Freq: Every evening | ORAL | 0 refills | Status: DC | PRN

## 2019-03-12 MED ORDER — AZITHROMYCIN 250 MG PO TABS
1000.0000 mg | ORAL_TABLET | Freq: Once | ORAL | Status: AC
  Administered 2019-03-12: 1000 mg via ORAL

## 2019-03-12 NOTE — ED Provider Notes (Signed)
[place]   MRN: [id number] DOB: 04/18/1985  Subjective:   Suzanne Bush is a 34 y.o. female presenting for several day history of right upper quadrant pain moderate to severe.  Patient states she would like treatment for chlamydia again.  She tested positive for this on 02/28/2019 as well as trichomonas, BV and yeast infection.  She underwent treatment for those infections.  Unfortunately, patient did have sex again thereafter with a partner that turned out to have chlamydia.  She has a history of cholecystectomy.  States that she tries to eat healthfully.  Does not hydrate very much with water.  Mostly drinks tea or soda.  Has occasional alcohol drink.  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  Denies taking chronic medications.    No Known Allergies  Past Medical History:  Diagnosis Date  . ADHD   . Bipolar 1 disorder ([place])   . Depression   . Hx of trichomoniasis   . Hypertension      Past Surgical History:  Procedure Laterality Date  . CESAREAN SECTION  2011  . CESAREAN SECTION N/A 09/20/2015   Procedure: CESAREAN SECTION;  Surgeon: Truett Mainland, DO;  Location: [place];  Service: Obstetrics;  Laterality: N/A;  . CHOLECYSTECTOMY  after baby was born    Family History  Problem Relation Age of Onset  . Hypertension Mother     Social History   Tobacco Use  . Smoking status: Current Every Day Smoker    Packs/day: 0.50    Types: Cigarettes  . Smokeless tobacco: Never Used  Substance Use Topics  . Alcohol use: Yes    Comment: socially  . Drug use: Not Currently    Frequency: 2.0 times per week    Types: Marijuana    Comment: Last smoked 12/24/2017    Review of Systems  Constitutional: Negative for chills, fever and malaise/fatigue.  Respiratory: Negative for cough and shortness of breath.   Cardiovascular: Negative for chest pain.  Gastrointestinal: Positive for abdominal pain. Negative for constipation,  diarrhea, nausea and vomiting.  Genitourinary: Negative for dysuria, flank pain, frequency, hematuria and urgency.  Musculoskeletal: Negative for back pain and myalgias.  Skin: Negative for rash.  Neurological: Negative for dizziness and headaches.  Psychiatric/Behavioral: Negative for substance abuse.     Objective:   Vitals: BP (!) 163/62 (BP Location: Left Arm)   Pulse 76   Temp 98.5 F (36.9 C) (Oral)   Resp 16   LMP 02/17/2019   SpO2 100%   Physical Exam Constitutional:      General: She is not in acute distress.    Appearance: Normal appearance. She is well-developed and normal weight. She is not ill-appearing, toxic-appearing or diaphoretic.  HENT:     Head: Normocephalic and atraumatic.     Right Ear: External ear normal.     Left Ear: External ear normal.     Nose: Nose normal.     Mouth/Throat:     Mouth: Mucous membranes are moist.     Pharynx: Oropharynx is clear.  Eyes:     General: No scleral icterus.    Extraocular Movements: Extraocular movements intact.     Pupils: Pupils are equal, round, and reactive to light.  Cardiovascular:     Rate and Rhythm: Normal rate and regular rhythm.     Heart sounds: Normal heart sounds. No murmur. No friction rub. No gallop.   Pulmonary:     Effort: Pulmonary effort  is normal. No respiratory distress.     Breath sounds: Normal breath sounds. No stridor. No wheezing, rhonchi or rales.  Abdominal:     General: Bowel sounds are normal. There is no distension.     Palpations: Abdomen is soft. There is no mass.     Tenderness: There is generalized abdominal tenderness and tenderness in the right upper quadrant and epigastric area. There is no right CVA tenderness, left CVA tenderness, guarding or rebound.  Skin:    General: Skin is warm and dry.     Coloration: Skin is not pale.     Findings: No rash.  Neurological:     General: No focal deficit present.     Mental Status: She is alert and oriented to person, place, and  time.  Psychiatric:        Mood and Affect: Mood normal.        Behavior: Behavior normal.        Thought Content: Thought content normal.        Judgment: Judgment normal.     Results for orders placed or performed during the hospital encounter of 03/12/19 (from the past 24 hour(s))  POCT urinalysis dip (device)     Status: Abnormal   Collection Time: 03/12/19  7:35 PM  Result Value Ref Range   Glucose, UA NEGATIVE NEGATIVE mg/dL   Bilirubin Urine NEGATIVE NEGATIVE   Ketones, ur NEGATIVE NEGATIVE mg/dL   Specific Gravity, Urine >=1.030 1.005 - 1.030   Hgb urine dipstick MODERATE (A) NEGATIVE   pH 6.0 5.0 - 8.0   Protein, ur NEGATIVE NEGATIVE mg/dL   Urobilinogen, UA 0.2 0.0 - 1.0 mg/dL   Nitrite NEGATIVE NEGATIVE   Leukocytes,Ua NEGATIVE NEGATIVE  POC urine pregnancy     Status: None   Collection Time: 03/12/19  7:42 PM  Result Value Ref Range   Preg Test, Ur NEGATIVE NEGATIVE  Pregnancy, urine POC     Status: None   Collection Time: 03/12/19  7:42 PM  Result Value Ref Range   Preg Test, Ur NEGATIVE NEGATIVE  POC urine preg, ED (not at [facility])     Status: None   Collection Time: 03/12/19  7:42 PM  Result Value Ref Range   Preg Test, Ur NEGATIVE NEGATIVE    Assessment and Plan :   1. Right upper quadrant abdominal pain   2. Chlamydia contact     Vital signs and physical exam findings stable for discharge.  Recommended patient hydrate much better.  Use naproxen for pain, Zanaflex for muscle relaxant properties.  1 g azithromycin given in clinic.  STI testing pending. Counseled patient on potential for adverse effects with medications prescribed/recommended today, ER and return-to-clinic precautions discussed, patient verbalized understanding.    Wallis Bamberg, [place] 03/14/19 [phone]

## 2019-03-12 NOTE — Discharge Instructions (Signed)
Hydrate well with at least 2 liters (64 ounces) of water daily. For healthier diet please make sure you are avoiding starchy, carbohydrate foods like pasta, breads, pastry, rice, potatoes, desserts. These foods can elevated your blood sugar. Also, limit your alcohol drinking to 1 per day, avoid sodas, sweet teas. For elevated blood pressure, make sure you are monitoring salt in your diet.  Do not eat restaurant foods and limit processed foods at home.  Processed foods include things like frozen meals preseason meats and dinners.  Make sure your pain attention to sodium labels on foods you by at the grocery store.  For seasoning you can use a brand called Mrs. Dash which includes a lot of salt free seasonings.  Salads - kale, spinach, cabbage, spring mix; use seeds like pumpkin seeds or sunflower seeds, almonds; you can also use 1-2 hard boiled eggs in your salads Fruits - avocadoes, berries (blueberries, raspberries, blackberries), apples, oranges Vegetables - aspargus, cauliflower, broccoli, green beans, brussel spouts, bell peppers; stay away from starchy vegetables like potatoes, carrots, peas  Regarding meat it is better to eat lean meats and limit your red meat consumption including pork.  Wild caught fish, chicken breast are good options.  Do not eat any foods on this list that you are allergic to.

## 2019-03-12 NOTE — ED Triage Notes (Signed)
Pt presents with right upper quadrant pain since yesterday.

## 2019-03-14 ENCOUNTER — Telehealth (HOSPITAL_COMMUNITY): Payer: Self-pay | Admitting: Emergency Medicine

## 2019-03-14 LAB — CERVICOVAGINAL ANCILLARY ONLY
Bacterial vaginitis: NEGATIVE
Candida vaginitis: NEGATIVE
Chlamydia: POSITIVE — AB
Neisseria Gonorrhea: NEGATIVE
Trichomonas: NEGATIVE

## 2019-03-14 NOTE — Telephone Encounter (Signed)
Chlamydia is positive.  This was treated at the urgent care visit with po zithromax 1g.  Please refrain from sexual intercourse for 7 days to give the medicine time to work.  Sexual partners need to be notified and tested/treated.  Condoms may reduce risk of reinfection.  Recheck or followup with PCP for further evaluation if symptoms are not improving.  [facility] notified.  Patient contacted by phone and made aware of    results. Pt verbalized understanding and had all questions answered.    

## 2019-03-17 ENCOUNTER — Ambulatory Visit (INDEPENDENT_AMBULATORY_CARE_PROVIDER_SITE_OTHER): Payer: Medicaid Other | Admitting: Obstetrics

## 2019-03-17 ENCOUNTER — Other Ambulatory Visit: Payer: Self-pay

## 2019-03-17 ENCOUNTER — Encounter: Payer: Self-pay | Admitting: Obstetrics

## 2019-03-17 VITALS — BP 134/84 | HR 66 | Wt 203.0 lb

## 2019-03-17 DIAGNOSIS — Z3046 Encounter for surveillance of implantable subdermal contraceptive: Secondary | ICD-10-CM

## 2019-03-17 DIAGNOSIS — E669 Obesity, unspecified: Secondary | ICD-10-CM | POA: Diagnosis not present

## 2019-03-17 DIAGNOSIS — F17200 Nicotine dependence, unspecified, uncomplicated: Secondary | ICD-10-CM | POA: Diagnosis not present

## 2019-03-17 DIAGNOSIS — Z202 Contact with and (suspected) exposure to infections with a predominantly sexual mode of transmission: Secondary | ICD-10-CM | POA: Diagnosis not present

## 2019-03-17 NOTE — Progress Notes (Signed)
Subjective:    Suzanne Bush is a 34 y.o. female who presents for contraception counseling. The patient has no complaints today. The patient is sexually active. Pertinent past medical history: current smoker and sexually transmitted diseases.  The information documented in the HPI was reviewed and verified.  Menstrual History: OB History    Gravida  6   Para  2   Term  2   Preterm  0   AB  3   Living  2     SAB  3   TAB  0   Ectopic  0   Multiple  0   Live Births  2        Obstetric Comments  05/2009: pLTCS for NRFHT. 7lbs 2oz. 1 layer chromic closure         Patient's last menstrual period was 03/17/2019.   Patient Active Problem List   Diagnosis Date Noted  . Polysubstance abuse (HCC) 12/28/2017  . Encounter for supervision of normal pregnancy, unspecified, unspecified trimester 12/06/2017  . Adjustment disorder with disturbance of emotion 02/09/2017  . Status post cesarean delivery 09/21/2015  . Preeclampsia 09/20/2015  . Gestational hypertension w/o significant proteinuria in 3rd trimester 09/13/2015  . Supervision of normal pregnancy in third trimester 08/26/2015  . Obesity affecting pregnancy in third trimester 08/26/2015  . BMI 40.0-44.9, adult (HCC) 08/26/2015  . History of cesarean delivery 08/26/2015   Past Medical History:  Diagnosis Date  . ADHD   . Bipolar 1 disorder (HCC)   . Depression   . Hx of trichomoniasis   . Hypertension     Past Surgical History:  Procedure Laterality Date  . CESAREAN SECTION  2011  . CESAREAN SECTION N/A 09/20/2015   Procedure: CESAREAN SECTION;  Surgeon: Levie Heritage, DO;  Location: [facility] BIRTHING SUIT[facility];  Service: Obstetrics;  Laterality: N/A;  . CHOLECYSTECTOMY  after baby was born     Current Outpatient Medications:  .  naproxen (NAPROSYN) 500 MG tablet, Take 1 tablet (500 mg total) by mouth 2 (two) times daily., Disp: 30 tablet, Rfl: 0 .  tiZANidine (ZANAFLEX) 4 MG tablet, Take 1 tablet (4 mg total)  by mouth at bedtime as needed for muscle spasms., Disp: 30 tablet, Rfl: 0 No Known Allergies  Social History   Tobacco Use  . Smoking status: Current Every Day Smoker    Packs/day: 0.50    Types: Cigarettes  . Smokeless tobacco: Never Used  Substance Use Topics  . Alcohol use: Yes    Comment: socially    Family History  Problem Relation Age of Onset  . Hypertension Mother        Review of Systems Constitutional: negative for weight loss Genitourinary:negative for abnormal menstrual periods and vaginal discharge   Objective:   BP 134/84   Pulse 66   Wt 203 lb (92.1 kg)   LMP 03/17/2019   BMI 33.78 kg/m    General:   alert  Skin:   no rash or abnormalities  Lungs:   clear to auscultation bilaterally  Heart:   regular rate and rhythm, S1, S2 normal, no murmur, click, rub or gallop  Breasts:   normal without suspicious masses, skin or nipple changes or axillary nodes  Abdomen:  normal findings: no organomegaly, soft, non-tender and no hernia           Left Upper Arm:  Nexplanon insertion site is clean, dry and intact.  Rod palpated, intact  Lab Review Urine pregnancy test Labs reviewed yes  Radiologic studies reviewed no  50% of 15 min visit spent on counseling and coordination of care.    Assessment:    34 y.o., continuing Nexplanon, she has a tobacco dependence and is trying to quit.  The risks of clotting problems with the use of hormonal contraception concurrently with tobacco discussed.   Plan:    All questions answered. Contraception: Nexplanon. Discussed healthy lifestyle modifications. Follow up in 10 months.   No orders of the defined types were placed in this encounter.  No orders of the defined types were placed in this encounter.  Shelly Bombard, MD 03/17/2019 10:52 AM

## 2019-08-31 ENCOUNTER — Emergency Department (HOSPITAL_BASED_OUTPATIENT_CLINIC_OR_DEPARTMENT_OTHER)
Admission: EM | Admit: 2019-08-31 | Discharge: 2019-08-31 | Disposition: A | Discharge status: Home / Self Care | Payer: Medicaid Other | Attending: Emergency Medicine | Admitting: Emergency Medicine

## 2019-08-31 ENCOUNTER — Encounter (HOSPITAL_BASED_OUTPATIENT_CLINIC_OR_DEPARTMENT_OTHER): Payer: Self-pay | Admitting: Emergency Medicine

## 2019-08-31 ENCOUNTER — Other Ambulatory Visit: Payer: Self-pay

## 2019-08-31 DIAGNOSIS — M25521 Pain in right elbow: Secondary | ICD-10-CM | POA: Diagnosis not present

## 2019-08-31 DIAGNOSIS — R519 Headache, unspecified: Secondary | ICD-10-CM | POA: Insufficient documentation

## 2019-08-31 DIAGNOSIS — M7521 Bicipital tendinitis, right shoulder: Secondary | ICD-10-CM | POA: Insufficient documentation

## 2019-08-31 DIAGNOSIS — I10 Essential (primary) hypertension: Secondary | ICD-10-CM | POA: Diagnosis not present

## 2019-08-31 DIAGNOSIS — E669 Obesity, unspecified: Secondary | ICD-10-CM | POA: Diagnosis not present

## 2019-08-31 DIAGNOSIS — F909 Attention-deficit hyperactivity disorder, unspecified type: Secondary | ICD-10-CM | POA: Insufficient documentation

## 2019-08-31 DIAGNOSIS — F17210 Nicotine dependence, cigarettes, uncomplicated: Secondary | ICD-10-CM | POA: Diagnosis not present

## 2019-08-31 DIAGNOSIS — M25511 Pain in right shoulder: Secondary | ICD-10-CM | POA: Diagnosis present

## 2019-08-31 DIAGNOSIS — Z6834 Body mass index (BMI) 34.0-34.9, adult: Secondary | ICD-10-CM | POA: Insufficient documentation

## 2019-08-31 DIAGNOSIS — M67911 Unspecified disorder of synovium and tendon, right shoulder: Secondary | ICD-10-CM

## 2019-08-31 MED ORDER — NAPROXEN 500 MG PO TABS: 500.0000 mg | ORAL_TABLET | Freq: Two times a day (BID) | ORAL | 0 refills | Status: DC

## 2019-08-31 NOTE — ED Provider Notes (Signed)
[facility] EMERGENCY DEPARTMENT Provider Note   CSN: [id number] Arrival date & time: 08/31/19  8185     History Chief Complaint  Patient presents with  . Arm Pain    Suzanne Bush is a 34 y.o. female.  The history is provided by the patient.  Arm Pain This is a new problem. Episode onset: 2 weeks ago. The problem occurs daily. The problem has been gradually worsening. Associated symptoms include headaches. Pertinent negatives include no chest pain. Associated symptoms comments: Pain in the right shoulder and the right elbow.  No neck pain.  No weakness or numbness in the right arm.  No sx of the left arm or legs.  No trauma.  . The symptoms are aggravated by bending and twisting (worse with moving the arm. or trying to carry things). Nothing relieves the symptoms. Treatments tried: did take some percocet from her brother that helped a little but has not been taking anything regularly. The treatment provided no relief.       Past Medical History:  Diagnosis Date  . ADHD   . Bipolar 1 disorder (HCC)   . Depression   . Hx of trichomoniasis   . Hypertension     Patient Active Problem List   Diagnosis Date Noted  . Polysubstance abuse (HCC) 12/28/2017  . Encounter for supervision of normal pregnancy, unspecified, unspecified trimester 12/06/2017  . Adjustment disorder with disturbance of emotion 02/09/2017  . Status post cesarean delivery 09/21/2015  . Preeclampsia 09/20/2015  . Gestational hypertension w/o significant proteinuria in 3rd trimester 09/13/2015  . Supervision of normal pregnancy in third trimester 08/26/2015  . Obesity affecting pregnancy in third trimester 08/26/2015  . BMI 40.0-44.9, adult (HCC) 08/26/2015  . History of cesarean delivery 08/26/2015    Past Surgical History:  Procedure Laterality Date  . CESAREAN SECTION  2011  . CESAREAN SECTION N/A 09/20/2015   Procedure: CESAREAN SECTION;  Surgeon: Levie Heritage, DO;  Location: [facility] BIRTHING  SUITES;  Service: Obstetrics;  Laterality: N/A;  . CHOLECYSTECTOMY  after baby was born     OB History    Gravida  6   Para  2   Term  2   Preterm  0   AB  3   Living  2     SAB  3   TAB  0   Ectopic  0   Multiple  0   Live Births  2        Obstetric Comments  05/2009: pLTCS for NRFHT. 7lbs 2oz. 1 layer chromic closure        Family History  Problem Relation Age of Onset  . Hypertension Mother     Social History   Tobacco Use  . Smoking status: Current Every Day Smoker    Packs/day: 0.50    Types: Cigarettes  . Smokeless tobacco: Never Used  Vaping Use  . Vaping Use: Former  Substance Use Topics  . Alcohol use: Yes    Comment: socially  . Drug use: Not Currently    Frequency: 2.0 times per week    Types: Marijuana    Comment: Last smoked 12/24/2017    Home Medications Prior to Admission medications   Medication Sig Start Date End Date Taking? Authorizing Provider  naproxen (NAPROSYN) 500 MG tablet Take 1 tablet (500 mg total) by mouth 2 (two) times daily. 08/31/19   Gwyneth Sprout, MD  tiZANidine (ZANAFLEX) 4 MG tablet Take 1 tablet (4 mg total) by mouth at  bedtime as needed for muscle spasms. 03/12/19   Wallis Bamberg, PA-C  ferrous sulfate 325 (65 FE) MG tablet Take 1 tablet (325 mg total) by mouth 2 (two) times daily with a meal. Patient not taking: Reported on 01/08/2019 03/17/18 03/12/19  Brock Bad, MD    Allergies    Patient has no known allergies.  Review of Systems   Review of Systems  Respiratory:       No SOB, cough or fever  Cardiovascular: Negative for chest pain.  Neurological: Positive for headaches.       More frequent headaches and intermittently will have blurry vision in both eyes for seconds and then will have headaches.  Not associated with arm pain.  All other systems reviewed and are negative.   Physical Exam Updated Vital Signs BP (!) 157/98 (BP Location: Right Arm)   Pulse (!) 2   Temp 98.2 F (36.8 C)  (Oral)   Resp 20   Ht 5\' 5"  (1.651 m)   Wt (!) 94.8 kg   SpO2 98%   BMI 34.78 kg/m   Physical Exam Vitals and nursing note reviewed.  Constitutional:      General: She is not in acute distress.    Appearance: Normal appearance. She is well-developed. She is obese.  HENT:     Head: Normocephalic and atraumatic.     Mouth/Throat:     Mouth: Mucous membranes are moist.  Eyes:     Pupils: Pupils are equal, round, and reactive to light.  Cardiovascular:     Rate and Rhythm: Normal rate and regular rhythm.     Heart sounds: Normal heart sounds. No murmur heard.  No friction rub.  Pulmonary:     Effort: Pulmonary effort is normal.     Breath sounds: Normal breath sounds. No wheezing or rales.  Chest:     Chest wall: No tenderness.  Musculoskeletal:        General: Tenderness present.     Right shoulder: Tenderness and bony tenderness present. Decreased range of motion.       Arms:     Comments: No edema  Skin:    General: Skin is warm and dry.     Findings: No rash.  Neurological:     Mental Status: She is alert and oriented to person, place, and time.     Cranial Nerves: No cranial nerve deficit.  Psychiatric:        Behavior: Behavior normal.     ED Results / Procedures / Treatments   Labs (all labs ordered are listed, but only abnormal results are displayed) Labs Reviewed - No data to display  EKG None  Radiology No results found.  Procedures Procedures (including critical care time)  Medications Ordered in ED Medications - No data to display  ED Course  I have reviewed the triage vital signs and the nursing notes.  Pertinent labs & imaging results that were available during my care of the patient were reviewed by me and considered in my medical decision making (see chart for details).    MDM Rules/Calculators/A&P                          Patient is a 34 year old female presenting with several complaints.  She is complaining of pain in her shoulder  and elbow that has now been ongoing for 2 weeks.  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  The pain seems to be getting slightly worse.  She is not taking anything regularly for it but has taken a few Percocet that her brother gave her which did help with the pain.  She denies any neck pain and low suspicion for radiculopathy at this time.  Concern for possible tendinopathy related to her work as she is right-handed.  She has no neurologic findings at this time with normal strength and vascularly intact.  Encouraged NSAIDs, resting that arm and following up with orthopedics if symptoms do not improve.  Secondly patient is complaining of occasional intermittent blurry vision in both eyes for seconds and more frequent migraine headaches.  Suspect this is separate from her arm pain.  Feel that this is all within a migraine aura.  She is not currently having blurry vision or headache at this time.  Encouraged to follow-up with PCP for ongoing treatment if this becomes more regular.  Patient has no infectious symptoms concerning for Covid or other infectious etiology such as meningitis or septic joint.  She is otherwise well-appearing.  She was discharged in good condition to follow-up as indicated above if symptoms do not improve. Final Clinical Impression(s) / ED Diagnoses Final diagnoses:  Tendinopathy of right shoulder    Rx / DC Orders ED Discharge Orders         Ordered    naproxen (NAPROSYN) 500 MG tablet  2 times daily     Discontinue  Reprint     08/31/19 0751           Gwyneth Sprout, MD 08/31/19 1052

## 2019-08-31 NOTE — ED Triage Notes (Signed)
Pt states R arm pain intermittent x 2 weeks, no known injury. Also reports blurry vision that is also intermittent. Pt states that the two complaints are typically simultaneous. C/o arm pain now, but denies blurry vision on arrival.

## 2019-08-31 NOTE — Discharge Instructions (Signed)
It appears today that you have inflammation of your tendons in your shoulder and possibly in your elbow.  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  Will be important for you to try to use her left hand to do more things.  Use the anti-inflammatories for the next 1 to 2 weeks.  If symptoms are not better follow-up with sports medicine as you may need physical therapy or further evaluation.  Also if your headaches continue it would be important for you to get a regular doctor because you may need medication to control the headaches if they become worse or more frequent.

## 2019-09-08 ENCOUNTER — Telehealth: Payer: Self-pay | Admitting: Family Medicine

## 2019-09-08 NOTE — Telephone Encounter (Signed)
Gwyneth Sprout, MD ED referral to Dr.Schmitz for   Tendinopathy of right shoulder   Arm Pain   Called pt to schedule appt--unable to spk w/, message left for pt to call office.  -glh

## 2019-09-12 ENCOUNTER — Ambulatory Visit (HOSPITAL_COMMUNITY)
Admission: EM | Admit: 2019-09-12 | Discharge: 2019-09-12 | Disposition: A | Discharge status: Home / Self Care | Payer: Medicaid Other | Attending: Urgent Care | Admitting: Urgent Care

## 2019-09-12 ENCOUNTER — Other Ambulatory Visit: Payer: Self-pay

## 2019-09-12 ENCOUNTER — Encounter (HOSPITAL_COMMUNITY): Payer: Self-pay

## 2019-09-12 DIAGNOSIS — M542 Cervicalgia: Secondary | ICD-10-CM | POA: Diagnosis present

## 2019-09-12 DIAGNOSIS — Z7251 High risk heterosexual behavior: Secondary | ICD-10-CM | POA: Diagnosis present

## 2019-09-12 DIAGNOSIS — M25511 Pain in right shoulder: Secondary | ICD-10-CM

## 2019-09-12 DIAGNOSIS — M62838 Other muscle spasm: Secondary | ICD-10-CM

## 2019-09-12 MED ORDER — CYCLOBENZAPRINE HCL 10 MG PO TABS: 10.0000 mg | ORAL_TABLET | Freq: Three times a day (TID) | ORAL | 0 refills | Status: DC | PRN

## 2019-09-12 MED ORDER — MELOXICAM 15 MG PO TABS: 15.0000 mg | ORAL_TABLET | Freq: Every day | ORAL | 1 refills | Status: DC

## 2019-09-12 NOTE — ED Provider Notes (Signed)
[facility]   MRN: [id number] DOB: 10-04-1985  Subjective:   Suzanne Bush is a 34 y.o. female presenting for 1 month history of persistent intermittent right neck/shoulder pain.  Patient has been seen before, diagnosed with shoulder tendinopathy.  She has previously worked in [employer] and is still doing strenuous work activities.  She would also like STI testing.  Denies range of motion, falls, trauma.  Denies fever, nausea, vomiting, pelvic pain, vaginal discharge.  She does have sex without condom use.  Has a Nexplanon in place.  No current facility-administered medications for this encounter.  Current Outpatient Medications:  .  naproxen (NAPROSYN) 500 MG tablet, Take 1 tablet (500 mg total) by mouth 2 (two) times daily., Disp: 28 tablet, Rfl: 0 .  tiZANidine (ZANAFLEX) 4 MG tablet, Take 1 tablet (4 mg total) by mouth at bedtime as needed for muscle spasms., Disp: 30 tablet, Rfl: 0   Allergies  Allergen Reactions  . Acetaminophen     Past Medical History:  Diagnosis Date  . ADHD   . Bipolar 1 disorder (HCC)   . Depression   . Hx of trichomoniasis   . Hypertension      Past Surgical History:  Procedure Laterality Date  . CESAREAN SECTION  2011  . CESAREAN SECTION N/A 09/20/2015   Procedure: CESAREAN SECTION;  Surgeon: Levie Heritage, DO;  Location: [facility] BIRTHING SUITES;  Service: Obstetrics;  Laterality: N/A;  . CHOLECYSTECTOMY  after baby was born    Family History  Problem Relation Age of Onset  . Hypertension Mother     Social History   Tobacco Use  . Smoking status: Current Every Day Smoker    Packs/day: 0.50    Types: Cigarettes  . Smokeless tobacco: Never Used  Vaping Use  . Vaping Use: Former  Substance Use Topics  . Alcohol use: Yes    Comment: socially  . Drug use: Not Currently    Frequency: 2.0 times per week    Types: Marijuana    Comment: Last smoked 12/24/2017    ROS   Objective:   Vitals: BP 126/85 (BP Location: Right  Arm)   Pulse 65   Temp 98.6 F (37 C) (Oral)   Resp 18   SpO2 100%   Physical Exam Constitutional:      General: She is not in acute distress.    Appearance: Normal appearance. She is well-developed. She is obese. She is not ill-appearing, toxic-appearing or diaphoretic.  HENT:     Head: Normocephalic and atraumatic.     Right Ear: External ear normal.     Left Ear: External ear normal.     Nose: Nose normal.     Mouth/Throat:     Mouth: Mucous membranes are moist.     Pharynx: Oropharynx is clear.  Eyes:     General: No scleral icterus.       Right eye: No discharge.        Left eye: No discharge.     Extraocular Movements: Extraocular movements intact.     Conjunctiva/sclera: Conjunctivae normal.     Pupils: Pupils are equal, round, and reactive to light.  Cardiovascular:     Rate and Rhythm: Normal rate.  Pulmonary:     Effort: Pulmonary effort is normal.  Musculoskeletal:     Right shoulder: No swelling, deformity, effusion, laceration, tenderness, bony tenderness or crepitus. Normal range of motion. Normal strength.     Cervical back: Spasms and tenderness present. No swelling,  edema, deformity, erythema, signs of trauma, lacerations, rigidity, torticollis, bony tenderness or crepitus. Pain with movement present. Normal range of motion.     Comments: Tenderness of both trapezius with associated muscle spasms worse on the right side.  Skin:    General: Skin is warm and dry.  Neurological:     General: No focal deficit present.     Mental Status: She is alert and oriented to person, place, and time.  Psychiatric:        Mood and Affect: Mood normal.        Behavior: Behavior normal.        Thought Content: Thought content normal.        Judgment: Judgment normal.      Assessment and Plan :   PDMP not reviewed this encounter.  1. Trapezius muscle spasm   2. Neck pain   3. Acute pain of right shoulder   4. Unprotected sex     STI testing pending.   Recommended conservative management with meloxicam, tizanidine for her trapezius muscle spasm, ongoing possible shoulder tendinopathy.  Recommended rest from work, modification of work activities and better hydration on a daily basis.  Excellent physical exam findings, lack of trauma will defer imaging as such. Counseled patient on potential for adverse effects with medications prescribed/recommended today, ER and return-to-clinic precautions discussed, patient verbalized understanding.    Wallis Bamberg, PA-C 09/12/19 2023

## 2019-09-12 NOTE — ED Triage Notes (Signed)
Pt presents with right shoulder pain x 1 month. "when it hurts it hurts". Denies any trauma, fall. Pt requested STD testing.

## 2019-09-15 LAB — CERVICOVAGINAL ANCILLARY ONLY
Bacterial Vaginitis (gardnerella): POSITIVE — AB
Candida Glabrata: NEGATIVE
Candida Vaginitis: NEGATIVE
Chlamydia: POSITIVE — AB
Comment: NEGATIVE
Comment: NEGATIVE
Comment: NEGATIVE
Comment: NEGATIVE
Comment: NEGATIVE
Comment: NORMAL
Neisseria Gonorrhea: NEGATIVE
Trichomonas: POSITIVE — AB

## 2019-09-16 ENCOUNTER — Telehealth (HOSPITAL_COMMUNITY): Payer: Self-pay | Admitting: Emergency Medicine

## 2019-09-16 MED ORDER — AZITHROMYCIN 250 MG PO TABS: 1000.0000 mg | ORAL_TABLET | Freq: Once | ORAL | 0 refills | Status: AC

## 2019-09-16 MED ORDER — METRONIDAZOLE 500 MG PO TABS: 500.0000 mg | ORAL_TABLET | Freq: Two times a day (BID) | ORAL | 0 refills | Status: DC

## 2019-11-06 IMAGING — CR DG CHEST 2V
2 series · 2 of 2 positions shown · non-contrast
Comparison: Chest x-ray of October 22, 2009

CLINICAL DATA: Occasionally productive cough for while. History of
hypertension. Current smoker.

EXAM:
CHEST  2 VIEW

[w chest pa]
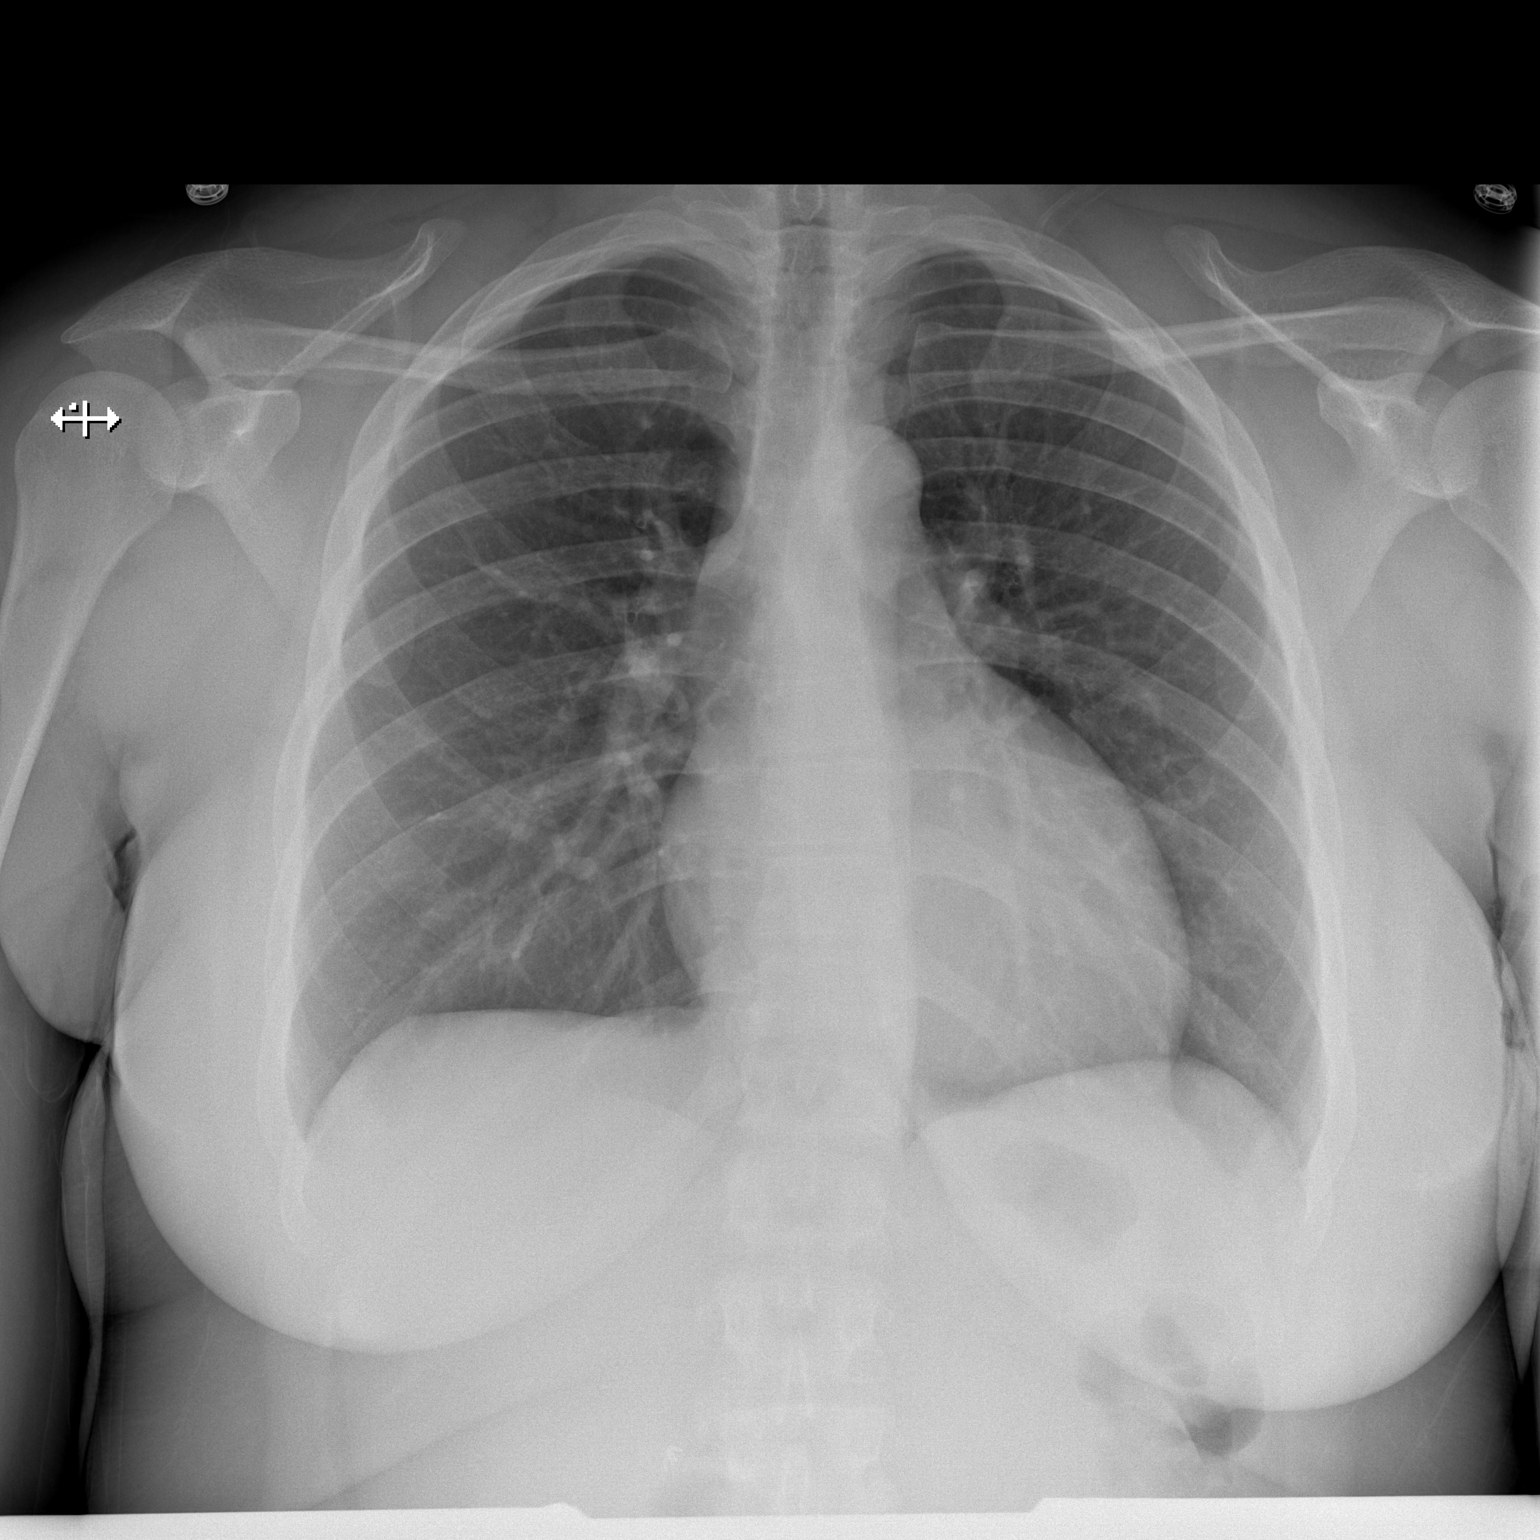

[w chest lat]
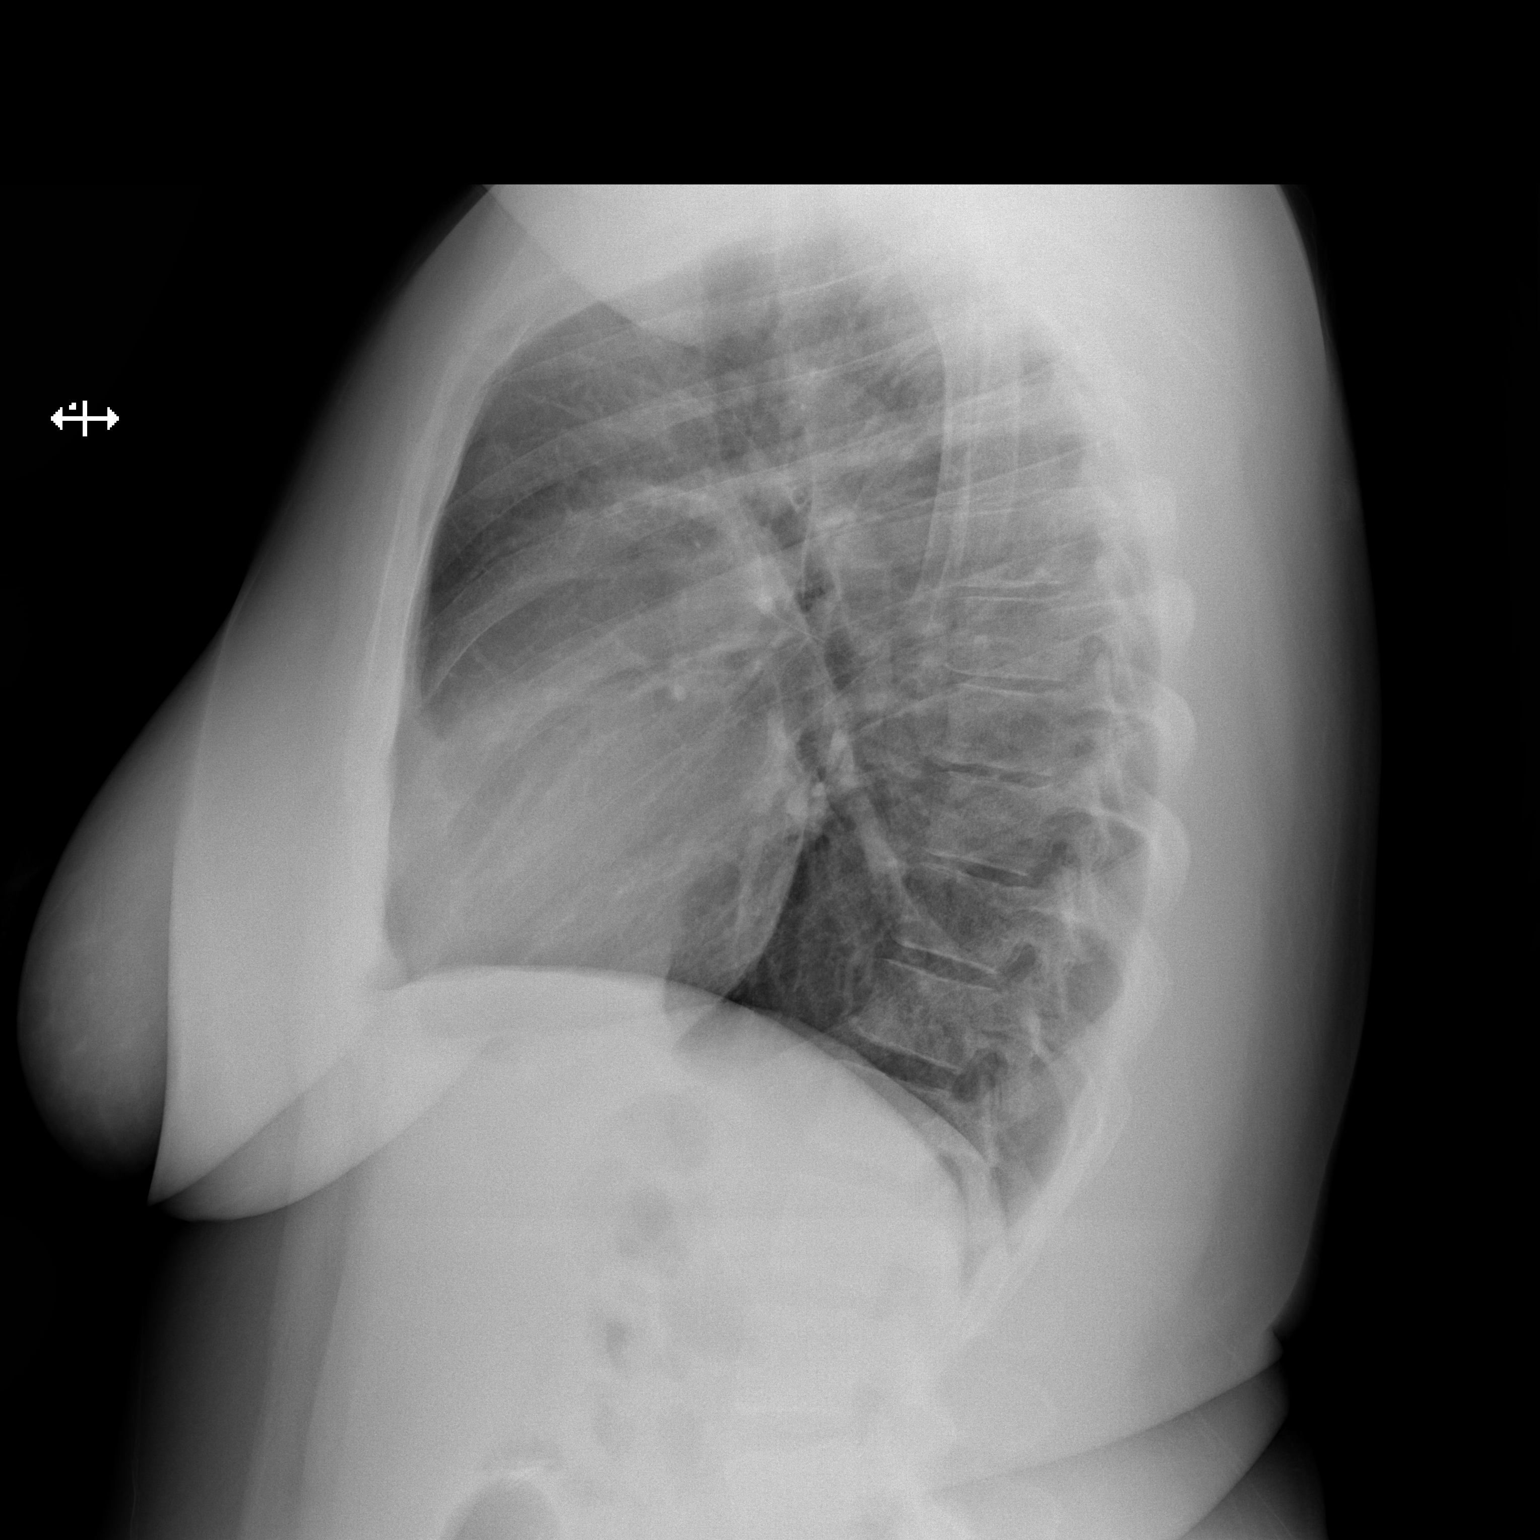

[2 of 2 positions shown; findings below may reference images not displayed]

FINDINGS: The lungs are adequately inflated. There is no focal infiltrate.
There is no pleural effusion. The heart and mediastinal structures
are normal. The trachea is midline. The bony thorax is unremarkable.
IMPRESSION: There is no active cardiopulmonary disease.

## 2020-04-13 ENCOUNTER — Other Ambulatory Visit (HOSPITAL_COMMUNITY)
Admission: RE | Admit: 2020-04-13 | Discharge: 2020-04-13 | Disposition: A | Discharge status: Home / Self Care | Payer: Medicaid Other | Source: Ambulatory Visit | Attending: Obstetrics | Admitting: Obstetrics

## 2020-04-13 ENCOUNTER — Encounter: Payer: Self-pay | Admitting: Obstetrics

## 2020-04-13 ENCOUNTER — Ambulatory Visit (INDEPENDENT_AMBULATORY_CARE_PROVIDER_SITE_OTHER): Payer: Medicaid Other | Admitting: Obstetrics

## 2020-04-13 ENCOUNTER — Other Ambulatory Visit: Payer: Self-pay

## 2020-04-13 VITALS — BP 123/83 | HR 64 | Ht 65.0 in | Wt 213.0 lb

## 2020-04-13 DIAGNOSIS — F17200 Nicotine dependence, unspecified, uncomplicated: Secondary | ICD-10-CM

## 2020-04-13 DIAGNOSIS — E669 Obesity, unspecified: Secondary | ICD-10-CM

## 2020-04-13 DIAGNOSIS — N898 Other specified noninflammatory disorders of vagina: Secondary | ICD-10-CM

## 2020-04-13 DIAGNOSIS — Z0000 Encounter for general adult medical examination without abnormal findings: Secondary | ICD-10-CM | POA: Diagnosis not present

## 2020-04-13 DIAGNOSIS — Z01419 Encounter for gynecological examination (general) (routine) without abnormal findings: Secondary | ICD-10-CM | POA: Insufficient documentation

## 2020-04-13 DIAGNOSIS — Z3202 Encounter for pregnancy test, result negative: Secondary | ICD-10-CM | POA: Diagnosis not present

## 2020-04-13 DIAGNOSIS — N939 Abnormal uterine and vaginal bleeding, unspecified: Secondary | ICD-10-CM

## 2020-04-13 DIAGNOSIS — Z8619 Personal history of other infectious and parasitic diseases: Secondary | ICD-10-CM | POA: Diagnosis not present

## 2020-04-13 DIAGNOSIS — Z3046 Encounter for surveillance of implantable subdermal contraceptive: Secondary | ICD-10-CM

## 2020-04-13 LAB — POCT URINE PREGNANCY: Preg Test, Ur: NEGATIVE

## 2020-04-13 NOTE — Progress Notes (Signed)
RGYN patient presents for problem visit pt c/o : Heavy Bleeding and pain.   Pt states bleeding was on and off and had noticed mucous like discharge that was large in amount.   LMP: since starting Birth Control pt states cycles have not been consistent

## 2020-04-13 NOTE — Progress Notes (Signed)
Subjective:        Suzanne Bush is a 35 y.o. female here for a routine exam.  Current complaints: Heavy and irregular periods, and vaginal discharge..    Personal health questionnaire:  Is patient Ashkenazi Jewish, have a family history of breast and/or ovarian cancer: no Is there a family history of uterine cancer diagnosed at age < 51, gastrointestinal cancer, urinary tract cancer, family member who is a Personnel officer syndrome-associated carrier: no Is the patient overweight and hypertensive, family history of diabetes, personal history of gestational diabetes, preeclampsia or PCOS: yes Is patient over 93, have PCOS,  family history of premature CHD under age 57, diabetes, smoke, have hypertension or peripheral artery disease:  no At any time, has a partner hit, kicked or otherwise hurt or frightened you?: no Over the past 2 weeks, have you felt down, depressed or hopeless?: no Over the past 2 weeks, have you felt little interest or pleasure in doing things?:no   Gynecologic History Patient's last menstrual period was 03/22/2020 (approximate). Contraception: Nexplanon Last Pap: 2020. Results were: normal Last mammogram: n/a. Results were: n/a  Obstetric History OB History  Gravida Para Term Preterm AB Living  6 2 2  0 3 2  SAB IAB Ectopic Multiple Live Births  3 0 0 0 2    # Outcome Date GA Lbr Len/2nd Weight Sex Delivery Anes PTL Lv  6 Gravida           5 Term 09/21/15 [redacted]w[redacted]d   M CS-LTranv Spinal  LIV     Complications: Hypertension in pregnancy, preeclampsia, severe, third trimester  4 Term 06/04/09    F CS-LTranv   LIV  3 SAB           2 SAB           1 SAB             Obstetric Comments  05/2009: pLTCS for NRFHT. 7lbs 2oz. 1 layer chromic closure    Past Medical History:  Diagnosis Date  . ADHD   . Bipolar 1 disorder (HCC)   . Depression   . Hx of trichomoniasis   . Hypertension     Past Surgical History:  Procedure Laterality Date  . CESAREAN SECTION  2011   . CESAREAN SECTION N/A 09/20/2015   Procedure: CESAREAN SECTION;  Surgeon: 09/22/2015, DO;  Location: [facility] BIRTHING SUITES;  Service: Obstetrics;  Laterality: N/A;  . CHOLECYSTECTOMY  after baby was born     Current Outpatient Medications:  .  etonogestrel (NEXPLANON) 68 MG IMPL implant, 1 each by Subdermal route once., Disp: , Rfl:  .  cyclobenzaprine (FLEXERIL) 10 MG tablet, Take 1 tablet (10 mg total) by mouth 3 (three) times daily as needed for muscle spasms. (Patient not taking: Reported on 04/13/2020), Disp: 30 tablet, Rfl: 0 .  meloxicam (MOBIC) 15 MG tablet, Take 1 tablet (15 mg total) by mouth daily. (Patient not taking: Reported on 04/13/2020), Disp: 30 tablet, Rfl: 1 .  metroNIDAZOLE (FLAGYL) 500 MG tablet, Take 1 tablet (500 mg total) by mouth 2 (two) times daily. (Patient not taking: Reported on 04/13/2020), Disp: 14 tablet, Rfl: 0 .  naproxen (NAPROSYN) 500 MG tablet, Take 1 tablet (500 mg total) by mouth 2 (two) times daily. (Patient not taking: Reported on 04/13/2020), Disp: 28 tablet, Rfl: 0 .  tiZANidine (ZANAFLEX) 4 MG tablet, Take 1 tablet (4 mg total) by mouth at bedtime as needed for muscle spasms. (Patient not taking: Reported  on 04/13/2020), Disp: 30 tablet, Rfl: 0 Allergies  Allergen Reactions  . Acetaminophen     Social History   Tobacco Use  . Smoking status: Current Every Day Smoker    Packs/day: 0.50    Types: Cigarettes  . Smokeless tobacco: Never Used  Substance Use Topics  . Alcohol use: Yes    Comment: socially    Family History  Problem Relation Age of Onset  . Hypertension Mother       Review of Systems  Constitutional: negative for fatigue and weight loss Respiratory: negative for cough and wheezing Cardiovascular: negative for chest pain, fatigue and palpitations Gastrointestinal: negative for abdominal pain and change in bowel habits Musculoskeletal:negative for myalgias Neurological: negative for gait problems and  tremors Behavioral/Psych: negative for abusive relationship, depression Endocrine: negative for temperature intolerance    Genitourinary:positive for abnormally heavy and irregular menstrual periods and vaginal discharge.  Negative for genital lesions, hot flashes, sexual problems  Integument/breast: negative for breast lump, breast tenderness, nipple discharge and skin lesion(s)    Objective:       BP 123/83   Pulse 64   Ht 5\' 5"  (1.651 m)   Wt 213 lb (96.6 kg)   LMP 03/22/2020 (Approximate)   BMI 35.45 kg/m  General:   alert and no distress  Skin:   no rash or abnormalities  Lungs:   clear to auscultation bilaterally  Heart:   regular rate and rhythm, S1, S2 normal, no murmur, click, rub or gallop  Breasts:   normal without suspicious masses, skin or nipple changes or axillary nodes  Abdomen:  normal findings: no organomegaly, soft, non-tender and no hernia  Pelvis:  External genitalia: normal general appearance Urinary system: urethral meatus normal and bladder without fullness, nontender Vaginal: normal without tenderness, induration or masses Cervix: normal appearance Adnexa: normal bimanual exam Uterus: anteverted and non-tender, normal size   Lab Review Urine pregnancy test Labs reviewed yes Radiologic studies reviewed no  50% of 20 min visit spent on counseling and coordination of care.   Assessment:     1. Encounter for routine gynecological examination with Papanicolaou smear of cervix Rx: - Cytology - PAP( )  2. Abnormal uterine bleeding (AUB) - O - probable hormonal imbalance from Nexplanon - continue Nexplanon  3. Vaginal discharge Rx: - Cervicovaginal ancillary only( )  4. History of chlamydia - check cultures  5. Obesity (BMI 35.0-39.9 without comorbidity) - recommend caloric reduction, exercise and behavioral modification  6. Tobacco dependence - cessation with medication and behavioral modification  7. Encounter for  surveillance of Nexplanon subdermal contraceptive - considering discontinuing Nexplanon because of AUB    Plan:    Education reviewed: calcium supplements, depression evaluation, low fat, low cholesterol diet, safe sex/STD prevention, self breast exams, smoking cessation and weight bearing exercise. Contraception: Nexplanon. Follow up in: 3 months.    12-30[birth year]1989, MD 04/13/2020 3:55 PM

## 2020-04-14 LAB — CERVICOVAGINAL ANCILLARY ONLY
Bacterial Vaginitis (gardnerella): POSITIVE — AB
Candida Glabrata: NEGATIVE
Candida Vaginitis: NEGATIVE
Chlamydia: POSITIVE — AB
Comment: NEGATIVE
Comment: NEGATIVE
Comment: NEGATIVE
Comment: NEGATIVE
Comment: NEGATIVE
Comment: NORMAL
Neisseria Gonorrhea: NEGATIVE
Trichomonas: POSITIVE — AB

## 2020-04-15 ENCOUNTER — Other Ambulatory Visit: Payer: Self-pay | Admitting: Obstetrics

## 2020-04-15 DIAGNOSIS — N760 Acute vaginitis: Secondary | ICD-10-CM

## 2020-04-15 DIAGNOSIS — A749 Chlamydial infection, unspecified: Secondary | ICD-10-CM

## 2020-04-15 DIAGNOSIS — A599 Trichomoniasis, unspecified: Secondary | ICD-10-CM

## 2020-04-15 DIAGNOSIS — B9689 Other specified bacterial agents as the cause of diseases classified elsewhere: Secondary | ICD-10-CM

## 2020-04-15 LAB — CYTOLOGY - PAP
Comment: NEGATIVE
Diagnosis: NEGATIVE
High risk HPV: NEGATIVE

## 2020-04-15 MED ORDER — TINIDAZOLE 500 MG PO TABS: 2.0000 g | ORAL_TABLET | Freq: Every day | ORAL | 0 refills | Status: DC

## 2020-04-15 MED ORDER — DOXYCYCLINE HYCLATE 100 MG PO CAPS: 100.0000 mg | ORAL_CAPSULE | Freq: Two times a day (BID) | ORAL | 0 refills | Status: DC

## 2020-04-16 ENCOUNTER — Telehealth: Payer: Self-pay

## 2020-04-16 ENCOUNTER — Other Ambulatory Visit: Payer: Self-pay | Admitting: Obstetrics

## 2020-04-16 DIAGNOSIS — A5901 Trichomonal vulvovaginitis: Secondary | ICD-10-CM

## 2020-04-16 DIAGNOSIS — A599 Trichomoniasis, unspecified: Secondary | ICD-10-CM

## 2020-04-16 MED ORDER — METRONIDAZOLE 500 MG PO TABS: 2000.0000 mg | ORAL_TABLET | Freq: Once | ORAL | 0 refills | Status: AC

## 2020-04-16 NOTE — Telephone Encounter (Signed)
-----   Message from Brock Bad, MD sent at 04/15/2020  9:18 AM EDT ----- Tinidazole Rx for Trichomonas and BV Doxycycline Rx for Chlamydia

## 2020-04-16 NOTE — Telephone Encounter (Signed)
Pt given lab results and voices understanding. Advised pt to always practice safe and abstain from any unprotected intercourse for at least 7-10 days after everyone has received correct treatment. Advised pt to have partner(s)  tested and treated. Informed pt she needs a TOC in 3 months. Pt verbalizes understanding.

## 2020-04-18 ENCOUNTER — Other Ambulatory Visit: Payer: Self-pay

## 2020-04-18 DIAGNOSIS — A749 Chlamydial infection, unspecified: Secondary | ICD-10-CM

## 2020-04-18 DIAGNOSIS — B9689 Other specified bacterial agents as the cause of diseases classified elsewhere: Secondary | ICD-10-CM

## 2020-04-18 DIAGNOSIS — A599 Trichomoniasis, unspecified: Secondary | ICD-10-CM

## 2020-04-20 NOTE — Telephone Encounter (Signed)
Please review for refill.  

## 2020-04-21 ENCOUNTER — Telehealth: Payer: Medicaid Other | Admitting: Physician Assistant

## 2020-04-21 DIAGNOSIS — M549 Dorsalgia, unspecified: Secondary | ICD-10-CM

## 2020-04-21 MED ORDER — DOXYCYCLINE HYCLATE 100 MG PO CAPS: 100.0000 mg | ORAL_CAPSULE | Freq: Two times a day (BID) | ORAL | 0 refills | Status: DC

## 2020-04-21 MED ORDER — TINIDAZOLE 500 MG PO TABS: 2.0000 g | ORAL_TABLET | Freq: Every day | ORAL | 0 refills | Status: DC

## 2020-04-21 NOTE — Progress Notes (Signed)
Based on what you shared with me, I feel your condition warrants further evaluation and I recommend that you be seen for a face to face office visit. Giving severity and duration of pain you need to be seen in person for a good examination and to determine the most appropriate treatment. We also do not prescribe controlled medications (Percocet and related products) via e-visit. Please be seen by your primary care or at a local urgent care.     NOTE: If you entered your credit card information for this eVisit, you will not be charged. You may see a "hold" on your card for the $35 but that hold will drop off and you will not have a charge processed.   If you are having a true medical emergency please call 911.      For an urgent face to face visit, [place] has five urgent care centers for your convenience:     [facility] Directions [phone] [address] [place], [place] [address] . 10 am - 6pm Monday - Friday    [facility]) Get Driving Directions [phone] [address] [place], [place] [address] . 10 am to 8 pm Monday-Friday . 12 pm to 8 pm [facility] Get Driving Directions [phone] [address] [place], [place] [address] . 8 am to 8 pm Monday-Friday . 9 am to 6 pm Saturday . 11 am to 6 pm Sunday     [facility] at [facility] Get Driving Directions  [phone] [address] [place], [place] [address] . 8 am to 8 pm Monday-Friday . 8 am to 4 pm [facility] at [facility] Directions [phone] [address] [place], [place] [address] . 12 pm to 6 pm Monday-Friday      Your e-visit answers were reviewed by a board certified advanced clinical practitioner to complete your personal care plan.  Thank you for using  e-Visits.

## 2020-04-26 ENCOUNTER — Other Ambulatory Visit: Payer: Self-pay

## 2020-04-26 DIAGNOSIS — A749 Chlamydial infection, unspecified: Secondary | ICD-10-CM

## 2020-05-03 ENCOUNTER — Other Ambulatory Visit: Payer: Self-pay

## 2020-05-03 DIAGNOSIS — A749 Chlamydial infection, unspecified: Secondary | ICD-10-CM

## 2020-07-20 ENCOUNTER — Other Ambulatory Visit: Payer: Self-pay

## 2020-07-20 DIAGNOSIS — A749 Chlamydial infection, unspecified: Secondary | ICD-10-CM

## 2020-07-22 ENCOUNTER — Encounter: Payer: Self-pay | Admitting: Obstetrics

## 2020-07-22 ENCOUNTER — Ambulatory Visit (INDEPENDENT_AMBULATORY_CARE_PROVIDER_SITE_OTHER): Payer: Medicaid Other | Admitting: Obstetrics

## 2020-07-22 ENCOUNTER — Other Ambulatory Visit: Payer: Self-pay

## 2020-07-22 VITALS — BP 141/102 | HR 69 | Ht 65.0 in | Wt 204.5 lb

## 2020-07-22 DIAGNOSIS — Z3046 Encounter for surveillance of implantable subdermal contraceptive: Secondary | ICD-10-CM

## 2020-07-22 DIAGNOSIS — I10 Essential (primary) hypertension: Secondary | ICD-10-CM

## 2020-07-22 DIAGNOSIS — Z3009 Encounter for other general counseling and advice on contraception: Secondary | ICD-10-CM

## 2020-07-22 DIAGNOSIS — Z30016 Encounter for initial prescription of transdermal patch hormonal contraceptive device: Secondary | ICD-10-CM

## 2020-07-22 MED ORDER — XULANE 150-35 MCG/24HR TD PTWK: 1.0000 | MEDICATED_PATCH | TRANSDERMAL | 12 refills | Status: DC

## 2020-07-22 NOTE — Progress Notes (Signed)
Pt is in the office for nexplanon removal. Pt states that she does not want any other form of BC

## 2020-07-22 NOTE — Progress Notes (Signed)
NEXPLANON REMOVAL NOTE  Date of LMP:   unknown  Contraception used: *Nexplanon   Indications:  The patient desires contraception.  She understands risks, benefits, and alternatives to Implanon and would like to proceed.  Anesthesia:   Lidocaine 1% plain.  Procedure:  A time-out was performed confirming the procedure and the patient's allergy status.  Complications: None                      The rod was palpated and the area was sterilely prepped.  The area beneath the distal tip was anesthetized with 1% xylocaine and the skin incised                       Over the tip and the tip was exposed, grasped with forcep and removed intact.  A single suture of 4-0 Vicryl was used to close incision.  Steri strip                       And a bandage applied and the arm was wrapped with gauze bandage.  The patient tolerated well.  Instructions:  The patient was instructed to remove the dressing in 24 hours and that some bruising is to be expected.  She was advised to use over the counter analgesics as needed for any pain at the site.  She is to keep the area dry for 24 hours and to call if her hand or arm becomes cold, numb, or blue.  Return visit:  Return in 2 weeks    Brock Bad, MD 07/22/2020 10:11 AM

## 2020-07-26 ENCOUNTER — Ambulatory Visit: Payer: Medicaid Other | Admitting: Podiatry

## 2020-08-05 ENCOUNTER — Ambulatory Visit: Payer: Medicaid Other | Admitting: Obstetrics

## 2020-08-17 ENCOUNTER — Encounter: Payer: Self-pay | Admitting: Obstetrics

## 2020-08-17 ENCOUNTER — Other Ambulatory Visit (HOSPITAL_COMMUNITY)
Admission: RE | Admit: 2020-08-17 | Discharge: 2020-08-17 | Disposition: A | Discharge status: Home / Self Care | Payer: Medicaid Other | Source: Ambulatory Visit | Attending: Obstetrics | Admitting: Obstetrics

## 2020-08-17 ENCOUNTER — Ambulatory Visit (INDEPENDENT_AMBULATORY_CARE_PROVIDER_SITE_OTHER): Payer: Medicaid Other | Admitting: Obstetrics

## 2020-08-17 ENCOUNTER — Other Ambulatory Visit: Payer: Self-pay

## 2020-08-17 VITALS — BP 123/87 | HR 66 | Wt 205.0 lb

## 2020-08-17 DIAGNOSIS — Z113 Encounter for screening for infections with a predominantly sexual mode of transmission: Secondary | ICD-10-CM | POA: Diagnosis not present

## 2020-08-17 DIAGNOSIS — Z9889 Other specified postprocedural states: Secondary | ICD-10-CM | POA: Diagnosis not present

## 2020-08-17 DIAGNOSIS — N898 Other specified noninflammatory disorders of vagina: Secondary | ICD-10-CM | POA: Diagnosis not present

## 2020-08-17 NOTE — Progress Notes (Signed)
Patient ID: Suzanne Bush, female   DOB: 1985/05/14, 35 y.o.   MRN: [id number]  Chief Complaint  Patient presents with   Follow-up    HPI Suzanne Bush is a 35 y.o. female.  Follow up after Nexplanon removal.  Complains of malodorous vaginal discharge. HPI  Past Medical History:  Diagnosis Date   ADHD    Bipolar 1 disorder (HCC)    Depression    Hx of trichomoniasis    Hypertension     Past Surgical History:  Procedure Laterality Date   CESAREAN SECTION  2011   CESAREAN SECTION N/A 09/20/2015   Procedure: CESAREAN SECTION;  Surgeon: Levie Heritage, DO;  Location: [facility] BIRTHING SUITES;  Service: Obstetrics;  Laterality: N/A;   CHOLECYSTECTOMY  after baby was born    Family History  Problem Relation Age of Onset   Hypertension Mother     Social History Social History   Tobacco Use   Smoking status: Every Day    Packs/day: 0.50    Types: Cigarettes   Smokeless tobacco: Never  Vaping Use   Vaping Use: Former  Substance Use Topics   Alcohol use: Yes    Comment: socially   Drug use: Not Currently    Frequency: 2.0 times per week    Types: Marijuana    Comment: Last smoked 12/24/2017    No Active Allergies  Current Outpatient Medications  Medication Sig Dispense Refill   cyclobenzaprine (FLEXERIL) 10 MG tablet Take 1 tablet (10 mg total) by mouth 3 (three) times daily as needed for muscle spasms. (Patient not taking: No sig reported) 30 tablet 0   doxycycline (VIBRAMYCIN) 100 MG capsule Take 1 capsule (100 mg total) by mouth 2 (two) times daily. (Patient not taking: No sig reported) 14 capsule 0   meloxicam (MOBIC) 15 MG tablet Take 1 tablet (15 mg total) by mouth daily. (Patient not taking: No sig reported) 30 tablet 1   metroNIDAZOLE (FLAGYL) 500 MG tablet Take 1 tablet (500 mg total) by mouth 2 (two) times daily. (Patient not taking: No sig reported) 14 tablet 0   naproxen (NAPROSYN) 500 MG tablet Take 1 tablet (500 mg total) by mouth 2 (two) times daily.  (Patient not taking: No sig reported) 28 tablet 0   tinidazole (TINDAMAX) 500 MG tablet Take 4 tablets (2,000 mg total) by mouth daily with breakfast. (Patient not taking: No sig reported) 8 tablet 0   tiZANidine (ZANAFLEX) 4 MG tablet Take 1 tablet (4 mg total) by mouth at bedtime as needed for muscle spasms. (Patient not taking: No sig reported) 30 tablet 0   XULANE 150-35 MCG/24HR transdermal patch Place 1 patch onto the skin once a week. (Patient not taking: Reported on 08/17/2020) 3 patch 12   No current facility-administered medications for this visit.    Review of Systems Review of Systems Constitutional: negative for fatigue and weight loss Respiratory: negative for cough and wheezing Cardiovascular: negative for chest pain, fatigue and palpitations Gastrointestinal: negative for abdominal pain and change in bowel habits Genitourinary:positive for malodorous vaginal discharge Integument/breast: negative for nipple discharge Musculoskeletal:negative for myalgias Neurological: negative for gait problems and tremors Behavioral/Psych: negative for abusive relationship, depression Endocrine: negative for temperature intolerance      Blood pressure 123/87, pulse 66, weight 205 lb (93 kg), last menstrual period 08/08/2020, unknown if currently breastfeeding.  Physical Exam Physical Exam General:   Alert and no discharge  Skin:   no rash or abnormalities  Lungs:   clear  to auscultation bilaterally  Heart:   regular rate and rhythm, S1, S2 normal, no murmur, click, rub or gallop  Breasts:   Not examined  Abdomen:  normal findings: no organomegaly, soft, non-tender and no hernia              Left upper arm: Nexplanon removal site is clean, dry and intact, non tender  I have spent a total of 15 minutes of face-to-face time, excluding clinical staff time, reviewing notes and preparing to see patient, ordering tests and/or medications, and counseling the patient.   Data Reviewed Wet  Prep  Assessment      1. Vaginal discharge - history of Trichomonas and Chlamydia, both treated - TOC needed  2. Screening for STD (sexually transmitted disease) Rx; - Cervicovaginal ancillary only( [place])  3. Postoperative state - Nexplanon removal site is healing well   Plan   Follow up prn   Brock Bad, MD 08/17/2020 2:40 PM

## 2020-08-17 NOTE — Progress Notes (Signed)
RGYN pt F/U from Nexplanon removal.  LMP: 08/08/20  CC: pt wants STD Screening.

## 2020-08-18 LAB — CERVICOVAGINAL ANCILLARY ONLY
Bacterial Vaginitis (gardnerella): POSITIVE — AB
Candida Glabrata: NEGATIVE
Candida Vaginitis: NEGATIVE
Chlamydia: NEGATIVE
Comment: NEGATIVE
Comment: NEGATIVE
Comment: NEGATIVE
Comment: NEGATIVE
Comment: NEGATIVE
Comment: NORMAL
Neisseria Gonorrhea: NEGATIVE
Trichomonas: NEGATIVE

## 2020-08-20 ENCOUNTER — Other Ambulatory Visit: Payer: Self-pay

## 2020-08-20 DIAGNOSIS — B9689 Other specified bacterial agents as the cause of diseases classified elsewhere: Secondary | ICD-10-CM

## 2020-08-20 DIAGNOSIS — N760 Acute vaginitis: Secondary | ICD-10-CM

## 2020-08-20 MED ORDER — METRONIDAZOLE 500 MG PO TABS: 500.0000 mg | ORAL_TABLET | Freq: Two times a day (BID) | ORAL | 0 refills | Status: DC

## 2020-08-23 ENCOUNTER — Other Ambulatory Visit: Payer: Self-pay | Admitting: Obstetrics

## 2020-12-22 ENCOUNTER — Emergency Department (HOSPITAL_COMMUNITY): Payer: Medicaid Other

## 2020-12-22 ENCOUNTER — Emergency Department (HOSPITAL_COMMUNITY)
Admission: EM | Admit: 2020-12-22 | Discharge: 2020-12-23 | Disposition: A | Discharge status: Home / Self Care | Payer: Medicaid Other | Attending: Emergency Medicine | Admitting: Emergency Medicine

## 2020-12-22 ENCOUNTER — Encounter (HOSPITAL_COMMUNITY): Payer: Self-pay

## 2020-12-22 DIAGNOSIS — Z20822 Contact with and (suspected) exposure to covid-19: Secondary | ICD-10-CM | POA: Diagnosis not present

## 2020-12-22 DIAGNOSIS — I10 Essential (primary) hypertension: Secondary | ICD-10-CM | POA: Insufficient documentation

## 2020-12-22 DIAGNOSIS — R079 Chest pain, unspecified: Secondary | ICD-10-CM | POA: Diagnosis not present

## 2020-12-22 DIAGNOSIS — F17210 Nicotine dependence, cigarettes, uncomplicated: Secondary | ICD-10-CM | POA: Insufficient documentation

## 2020-12-22 LAB — BASIC METABOLIC PANEL
Anion gap: 8 (ref 5–15)
BUN: 11 mg/dL (ref 6–20)
CO2: 26 mmol/L (ref 22–32)
Calcium: 9.3 mg/dL (ref 8.9–10.3)
Chloride: 104 mmol/L (ref 98–111)
Creatinine, Ser: 1.06 mg/dL — ABNORMAL HIGH (ref 0.44–1.00)
GFR, Estimated: >60 mL/min (ref >60)
Glucose, Bld: 113 mg/dL — ABNORMAL HIGH (ref 70–99)
Potassium: 3.7 mmol/L (ref 3.5–5.1)
Sodium: 138 mmol/L (ref 135–145)

## 2020-12-22 LAB — RESP PANEL BY RT-PCR (FLU A&B, COVID) ARPGX2
Influenza A by PCR: NEGATIVE
Influenza B by PCR: NEGATIVE
SARS Coronavirus 2 by RT PCR: NEGATIVE

## 2020-12-22 LAB — CBC
HCT: 42.8 % (ref 36.0–46.0)
Hemoglobin: 13.8 g/dL (ref 12.0–15.0)
MCH: 28 pg (ref 26.0–34.0)
MCHC: 32.2 g/dL (ref 30.0–36.0)
MCV: 86.8 fL (ref 80.0–100.0)
Platelets: 314 10*3/uL (ref 150–400)
RBC: 4.93 MIL/uL (ref 3.87–5.11)
RDW: 14.5 % (ref 11.5–15.5)
WBC: 13.7 10*3/uL — ABNORMAL HIGH (ref 4.0–10.5)
nRBC: 0 % (ref 0.0–0.2)

## 2020-12-22 LAB — I-STAT BETA HCG BLOOD, ED (MC, WL, AP ONLY): I-stat hCG, quantitative: <5 m[IU]/mL (ref <5)

## 2020-12-22 LAB — TROPONIN I (HIGH SENSITIVITY): Troponin I (High Sensitivity): 4 ng/L (ref <18)

## 2020-12-22 NOTE — ED Provider Notes (Signed)
Emergency Medicine Provider Triage Evaluation Note  Suzanne Bush , a 35 y.o. female  was evaluated in triage.  Pt complains of chest pain. Denies cough and sob.  Review of Systems  Positive: Chest pain Negative: Cough, sob  Physical Exam  BP (!) 143/91 (BP Location: Left Arm)   Pulse 85   Temp 98.2 F (36.8 C) (Oral)   Resp 18   SpO2 98%  Gen:   Awake, no distress   Resp:  Normal effort  MSK:   Moves extremities without difficulty  Other:  Heart rrr, lungs ctab  Medical Decision Making  Medically screening exam initiated at 9:45 PM.  Appropriate orders placed.  Suzanne Bush was informed that the remainder of the evaluation will be completed by another provider, this initial triage assessment does not replace that evaluation, and the importance of remaining in the ED until their evaluation is complete.     Rayne Du 12/22/20 2145    Ernie Avena, MD 12/22/20 2207

## 2020-12-22 NOTE — ED Triage Notes (Signed)
Pt reports that she has been having CP on and off since yesterday, some nausea

## 2020-12-23 LAB — TROPONIN I (HIGH SENSITIVITY): Troponin I (High Sensitivity): 5 ng/L (ref <18)

## 2020-12-23 NOTE — Discharge Instructions (Addendum)
Your tests are all negative and there is no evidence of a serious medical condition causing your chest pain.   You can be discharged home and are encouraged to follow up with a primary care provider if pain continues.   Return to the ED with any new or concerning symptoms at any time.

## 2020-12-23 NOTE — ED Provider Notes (Signed)
[facility] EMERGENCY DEPARTMENT Provider Note   CSN: [id number] Arrival date & time: 12/22/20  2114     History Chief Complaint  Patient presents with   Chest Pain    Suzanne Bush is a 35 y.o. female.  Patient  presents for evaluation of 2 days of intermittent chest pain. She describes sharp, central chest pain without aggravating or alleviating factors. No SOB, nausea, vomiting. No symptoms of URI, no fever.   The history is provided by the patient. No language interpreter was used.  Chest Pain Associated symptoms: no abdominal pain, no cough, no fever, no nausea, no shortness of breath and no vomiting       Past Medical History:  Diagnosis Date   ADHD    Bipolar 1 disorder ([place])    Depression    Hx of trichomoniasis    Hypertension     Patient Active Problem List   Diagnosis Date Noted   Polysubstance abuse ([place]) 12/28/2017   Encounter for supervision of normal pregnancy, unspecified, unspecified trimester 12/06/2017   Adjustment disorder with disturbance of emotion 02/09/2017   Status post cesarean delivery 09/21/2015   Preeclampsia 09/20/2015   Gestational hypertension w/o significant proteinuria in 3rd trimester 09/13/2015   Supervision of normal pregnancy in third trimester 08/26/2015   Obesity affecting pregnancy in third trimester 08/26/2015   BMI 40.0-44.9, adult ([place]) 08/26/2015   History of cesarean delivery 08/26/2015    Past Surgical History:  Procedure Laterality Date   CESAREAN SECTION  2011   CESAREAN SECTION N/A 09/20/2015   Procedure: CESAREAN SECTION;  Surgeon: Truett Mainland, DO;  Location: [place];  Service: Obstetrics;  Laterality: N/A;   CHOLECYSTECTOMY  after baby was born     OB History     Gravida  6   Para  2   Term  2   Preterm  0   AB  3   Living  2      SAB  3   IAB  0   Ectopic  0   Multiple  0   Live Births  2        Obstetric Comments  05/2009: pLTCS for NRFHT. 7lbs  2oz. 1 layer chromic closure         Family History  Problem Relation Age of Onset   Hypertension Mother     Social History   Tobacco Use   Smoking status: Every Day    Packs/day: 0.50    Types: Cigarettes   Smokeless tobacco: Never  Vaping Use   Vaping Use: Former  Substance Use Topics   Alcohol use: Yes    Comment: socially   Drug use: Not Currently    Frequency: 2.0 times per week    Types: Marijuana    Comment: Last smoked 12/24/2017    Home Medications Prior to Admission medications   Medication Sig Start Date End Date Taking? Authorizing Provider  cyclobenzaprine (FLEXERIL) 10 MG tablet Take 1 tablet (10 mg total) by mouth 3 (three) times daily as needed for muscle spasms. Patient not taking: No sig reported 09/12/19   Jaynee Eagles, PA-C  doxycycline (VIBRAMYCIN) 100 MG capsule Take 1 capsule (100 mg total) by mouth 2 (two) times daily. Patient not taking: No sig reported 04/21/20   Shelly Bombard, MD  meloxicam (MOBIC) 15 MG tablet Take 1 tablet (15 mg total) by mouth daily. Patient not taking: No sig reported 09/12/19   Jaynee Eagles, PA-C  metroNIDAZOLE (FLAGYL)  500 MG tablet Take 1 tablet (500 mg total) by mouth 2 (two) times daily. Patient not taking: No sig reported 09/16/19   Merrilee Jansky, MD  metroNIDAZOLE (FLAGYL) 500 MG tablet Take 1 tablet (500 mg total) by mouth 2 (two) times daily. 08/20/20   Brock Bad, MD  naproxen (NAPROSYN) 500 MG tablet Take 1 tablet (500 mg total) by mouth 2 (two) times daily. Patient not taking: No sig reported 08/31/19   Gwyneth Sprout, MD  tinidazole (TINDAMAX) 500 MG tablet Take 4 tablets (2,000 mg total) by mouth daily with breakfast. Patient not taking: No sig reported 04/21/20   Brock Bad, MD  tiZANidine (ZANAFLEX) 4 MG tablet Take 1 tablet (4 mg total) by mouth at bedtime as needed for muscle spasms. Patient not taking: No sig reported 03/12/19   Wallis Bamberg, PA-C  Burr Medico 150-35 MCG/24HR transdermal patch  Place 1 patch onto the skin once a week. Patient not taking: Reported on 08/17/2020 07/22/20   Brock Bad, MD  ferrous sulfate 325 (65 FE) MG tablet Take 1 tablet (325 mg total) by mouth 2 (two) times daily with a meal. Patient not taking: Reported on 01/08/2019 03/17/18 03/12/19  Brock Bad, MD    Allergies    Patient has no known allergies.  Review of Systems   Review of Systems  Constitutional:  Negative for activity change, appetite change and fever.  Respiratory:  Negative for cough and shortness of breath.   Cardiovascular:  Positive for chest pain.  Gastrointestinal:  Negative for abdominal pain, nausea and vomiting.   Physical Exam Updated Vital Signs BP (!) 143/91 (BP Location: Left Arm)   Pulse 85   Temp 98.2 F (36.8 C) (Oral)   Resp 18   SpO2 98%   Physical Exam Vitals and nursing note reviewed.  Constitutional:      Appearance: She is well-developed.  HENT:     Head: Normocephalic.  Cardiovascular:     Rate and Rhythm: Normal rate and regular rhythm.     Heart sounds: No murmur heard. Pulmonary:     Effort: Pulmonary effort is normal.     Breath sounds: Normal breath sounds. No wheezing, rhonchi or rales.  Chest:     Chest wall: No tenderness.  Abdominal:     General: Bowel sounds are normal.     Palpations: Abdomen is soft.     Tenderness: There is no abdominal tenderness. There is no guarding or rebound.  Musculoskeletal:        General: Normal range of motion.     Cervical back: Normal range of motion and neck supple.  Skin:    General: Skin is warm and dry.  Neurological:     General: No focal deficit present.     Mental Status: She is alert and oriented to person, place, and time.    ED Results / Procedures / Treatments   Labs (all labs ordered are listed, but only abnormal results are displayed) Labs Reviewed  BASIC METABOLIC PANEL - Abnormal; Notable for the following components:      Result Value   Glucose, Bld 113 (*)     Creatinine, Ser 1.06 (*)    All other components within normal limits  CBC - Abnormal; Notable for the following components:   WBC 13.7 (*)    All other components within normal limits  RESP PANEL BY RT-PCR (FLU A&B, COVID) ARPGX2  I-STAT BETA HCG BLOOD, ED ([facility], [facility], [facility] ONLY)  TROPONIN  I (HIGH SENSITIVITY)  TROPONIN I (HIGH SENSITIVITY)    EKG EKG Interpretation  Date/Time:  Wednesday December 22 2020 21:27:23 EST Ventricular Rate:  74 PR Interval:  146 QRS Duration: 76 QT Interval:  380 QTC Calculation: 421 R Axis:   56 Text Interpretation: Normal sinus rhythm Cannot rule out Anterior infarct , age undetermined Abnormal ECG When compared with ECG of 12/26/2017, No significant change was found Confirmed by Delora Fuel ([id number]) on 12/23/2020 1:32:12 AM  Radiology DG Chest 2 View  Result Date: 12/22/2020 CLINICAL DATA:  Chest pain. EXAM: CHEST - 2 VIEW COMPARISON:  None. FINDINGS: The heart size and mediastinal contours are within normal limits. Both lungs are clear. The visualized skeletal structures are unremarkable. IMPRESSION: No active cardiopulmonary disease. Electronically Signed   By: Brett Fairy M.D.   On: 12/22/2020 22:22    Procedures Procedures   Medications Ordered in ED Medications - No data to display  ED Course  I have reviewed the triage vital signs and the nursing notes.  Pertinent labs & imaging results that were available during my care of the patient were reviewed by me and considered in my medical decision making (see chart for details).    MDM Rules/Calculators/A&P                           Patient to ED with chest pain x 2 days as per HPI.   Labs reviewed, including troponin x 2, and are negative. CXR clear. EKG without ischemic change.   She is felt appropriate for discharge home. Recommend PCP follow up and referral is provided.  Final Clinical Impression(s) / ED Diagnoses Final diagnoses:  Chest pain    Rx / DC Orders ED Discharge  Orders     None        Dennie Bible [id number]    Delora Fuel, MD [id number] [phone]

## 2020-12-27 ENCOUNTER — Encounter (HOSPITAL_COMMUNITY): Payer: Self-pay | Admitting: *Deleted

## 2020-12-27 ENCOUNTER — Other Ambulatory Visit: Payer: Self-pay

## 2020-12-27 ENCOUNTER — Ambulatory Visit (HOSPITAL_COMMUNITY)
Admission: EM | Admit: 2020-12-27 | Discharge: 2020-12-27 | Disposition: A | Discharge status: Home / Self Care | Payer: Medicaid Other | Attending: Emergency Medicine | Admitting: Emergency Medicine

## 2020-12-27 DIAGNOSIS — J020 Streptococcal pharyngitis: Secondary | ICD-10-CM

## 2020-12-27 DIAGNOSIS — G8929 Other chronic pain: Secondary | ICD-10-CM

## 2020-12-27 MED ORDER — PREDNISONE 10 MG (21) PO TBPK: ORAL_TABLET | Freq: Every day | ORAL | 0 refills | Status: DC

## 2020-12-27 MED ORDER — AMOXICILLIN-POT CLAVULANATE 875-125 MG PO TABS: 1.0000 | ORAL_TABLET | Freq: Two times a day (BID) | ORAL | 0 refills | Status: DC

## 2020-12-27 NOTE — ED Triage Notes (Signed)
Pt reports sore throat and back pain since yesterdaay

## 2020-12-27 NOTE — ED Provider Notes (Signed)
[facility]    CSN: [id number] Arrival date & time: 12/27/20  2703      History   Chief Complaint Chief Complaint  Patient presents with   Back Pain   Sore Throat    HPI Suzanne Bush is a 35 y.o. female.   Sore throat congestion  for a few days and chronic lower back pain.  Pt has not been seen by ortho for lower back pain. No injury, no urinary sx takes flexeril for pain.     Past Medical History:  Diagnosis Date   ADHD    Bipolar 1 disorder ([facility])    Depression    Hx of trichomoniasis    Hypertension     Patient Active Problem List   Diagnosis Date Noted   Polysubstance abuse ([facility]) 12/28/2017   Encounter for supervision of normal pregnancy, unspecified, unspecified trimester 12/06/2017   Adjustment disorder with disturbance of emotion 02/09/2017   Status post cesarean delivery 09/21/2015   Preeclampsia 09/20/2015   Gestational hypertension w/o significant proteinuria in 3rd trimester 09/13/2015   Supervision of normal pregnancy in third trimester 08/26/2015   Obesity affecting pregnancy in third trimester 08/26/2015   BMI 40.0-44.9, adult ([facility]) 08/26/2015   History of cesarean delivery 08/26/2015    Past Surgical History:  Procedure Laterality Date   CESAREAN SECTION  2011   CESAREAN SECTION N/A 09/20/2015   Procedure: CESAREAN SECTION;  Surgeon: Levie Heritage, DO;  Location: [facility];  Service: Obstetrics;  Laterality: N/A;   CHOLECYSTECTOMY  after baby was born    OB History     Gravida  6   Para  2   Term  2   Preterm  0   AB  3   Living  2      SAB  3   IAB  0   Ectopic  0   Multiple  0   Live Births  2        Obstetric Comments  05/2009: pLTCS for NRFHT. 7lbs 2oz. 1 layer chromic closure          Home Medications    Prior to Admission medications   Medication Sig Start Date End Date Taking? Authorizing Provider  amoxicillin-clavulanate (AUGMENTIN) 875-125 MG tablet Take 1 tablet by mouth  every 12 (twelve) hours. 12/27/20  Yes Coralyn Mark, NP  predniSONE (STERAPRED UNI-PAK 21 TAB) 10 MG (21) TBPK tablet Take by mouth daily. Take 6 tabs by mouth daily  for 2 days, then 5 tabs for 2 days, then 4 tabs for 2 days, then 3 tabs for 2 days, 2 tabs for 2 days, then 1 tab by mouth daily for 2 days 12/27/20  Yes Coralyn Mark, NP  cyclobenzaprine (FLEXERIL) 10 MG tablet Take 1 tablet (10 mg total) by mouth 3 (three) times daily as needed for muscle spasms. Patient not taking: No sig reported 09/12/19   Wallis Bamberg, PA-C  meloxicam (MOBIC) 15 MG tablet Take 1 tablet (15 mg total) by mouth daily. Patient not taking: No sig reported 09/12/19   Wallis Bamberg, PA-C  metroNIDAZOLE (FLAGYL) 500 MG tablet Take 1 tablet (500 mg total) by mouth 2 (two) times daily. Patient not taking: No sig reported 09/16/19   Merrilee Jansky, MD  naproxen (NAPROSYN) 500 MG tablet Take 1 tablet (500 mg total) by mouth 2 (two) times daily. Patient not taking: No sig reported 08/31/19   Gwyneth Sprout, MD  tinidazole (TINDAMAX) 500 MG tablet Take  4 tablets (2,000 mg total) by mouth daily with breakfast. Patient not taking: No sig reported 04/21/20   Shelly Bombard, MD  tiZANidine (ZANAFLEX) 4 MG tablet Take 1 tablet (4 mg total) by mouth at bedtime as needed for muscle spasms. Patient not taking: No sig reported 03/12/19   Jaynee Eagles, PA-C  [name] 150-35 MCG/24HR transdermal patch Place 1 patch onto the skin once a week. Patient not taking: Reported on 08/17/2020 07/22/20   Shelly Bombard, MD  ferrous sulfate 325 (65 FE) MG tablet Take 1 tablet (325 mg total) by mouth 2 (two) times daily with a meal. Patient not taking: Reported on 01/08/2019 03/17/18 03/12/19  Shelly Bombard, MD    Family History Family History  Problem Relation Age of Onset   Hypertension Mother     Social History Social History   Tobacco Use   Smoking status: Every Day    Packs/day: 0.50    Types: Cigarettes   Smokeless  tobacco: Never  Vaping Use   Vaping Use: Former  Substance Use Topics   Alcohol use: Yes    Comment: socially   Drug use: Not Currently    Frequency: 2.0 times per week    Types: Marijuana    Comment: Last smoked 12/24/2017     Allergies   Patient has no known allergies.   Review of Systems Review of Systems  Constitutional:  Positive for fever. Negative for activity change.  HENT:  Positive for congestion, postnasal drip, rhinorrhea, sinus pressure, sinus pain and sore throat. Negative for sneezing.   Eyes: Negative.   Respiratory:  Positive for cough. Negative for shortness of breath.   Cardiovascular: Negative.   Gastrointestinal: Negative.   Genitourinary: Negative.   Musculoskeletal:  Positive for back pain.  Skin: Negative.   Neurological: Negative.     Physical Exam Triage Vital Signs ED Triage Vitals  Enc Vitals Group     BP 12/27/20 1140 (!) 135/103     Pulse Rate 12/27/20 1140 82     Resp 12/27/20 1140 20     Temp 12/27/20 1140 98.4 F (36.9 C)     Temp src --      SpO2 12/27/20 1140 100 %     Weight --      Height --      Head Circumference --      Peak Flow --      Pain Score 12/27/20 1138 10     Pain Loc --      Pain Edu? --      Excl. in [place]? --    No data found.  Updated Vital Signs BP (!) 135/103   Pulse 82   Temp 98.4 F (36.9 C)   Resp 20   LMP 12/26/2020   SpO2 100%   Visual Acuity Right Eye Distance:   Left Eye Distance:   Bilateral Distance:    Right Eye Near:   Left Eye Near:    Bilateral Near:     Physical Exam Constitutional:      Appearance: She is obese.  HENT:     Right Ear: Tympanic membrane normal.     Left Ear: Tympanic membrane normal.     Nose: Congestion present.     Mouth/Throat:     Mouth: Oral lesions present.     Pharynx: Uvula midline. Pharyngeal swelling, oropharyngeal exudate, posterior oropharyngeal erythema and uvula swelling present.     Tonsils: Tonsillar exudate and tonsillar abscess present.  3+ on  the right. 3+ on the left.  Eyes:     Conjunctiva/sclera: Conjunctivae normal.  Cardiovascular:     Rate and Rhythm: Normal rate.  Pulmonary:     Effort: Pulmonary effort is normal.  Abdominal:     Palpations: Abdomen is soft.  Skin:    General: Skin is warm.  Neurological:     General: No focal deficit present.     Mental Status: She is alert.     UC Treatments / Results  Labs (all labs ordered are listed, but only abnormal results are displayed) Labs Reviewed - No data to display  EKG   Radiology No results found.  Procedures Procedures (including critical care time)  Medications Ordered in UC Medications - No data to display  Initial Impression / Assessment and Plan / UC Course  I have reviewed the triage vital signs and the nursing notes.  Pertinent labs & imaging results that were available during my care of the patient were reviewed by me and considered in my medical decision making (see chart for details).     If symptoms become worse go to er  Take NSAIDS as needed for pain You will need to follow up with orthopedic for back pain  Take full dose of abx with food  Final Clinical Impressions(s) / UC Diagnoses   Final diagnoses:  Chronic midline low back pain without sciatica  Strep pharyngitis     Discharge Instructions      If symptoms become worse go to er  Take NSAIDS as needed for pain You will need to follow up with orthopedic for back pain  Take full dose of abx with food       ED Prescriptions     Medication Sig Dispense Auth. Provider   predniSONE (STERAPRED UNI-PAK 21 TAB) 10 MG (21) TBPK tablet Take by mouth daily. Take 6 tabs by mouth daily  for 2 days, then 5 tabs for 2 days, then 4 tabs for 2 days, then 3 tabs for 2 days, 2 tabs for 2 days, then 1 tab by mouth daily for 2 days 42 tablet Morley Kos L, NP   amoxicillin-clavulanate (AUGMENTIN) 875-125 MG tablet Take 1 tablet by mouth every 12 (twelve) hours. 14 tablet  Marney Setting, NP      PDMP not reviewed this encounter.   Marney Setting, NP 12/27/20 [phone]

## 2020-12-27 NOTE — Discharge Instructions (Addendum)
If symptoms become worse go to er  Take NSAIDS as needed for pain You will need to follow up with orthopedic for back pain  Take full dose of abx with food

## 2021-01-11 ENCOUNTER — Encounter (HOSPITAL_COMMUNITY): Payer: Self-pay | Admitting: Emergency Medicine

## 2021-01-11 ENCOUNTER — Other Ambulatory Visit: Payer: Self-pay

## 2021-01-11 ENCOUNTER — Emergency Department (HOSPITAL_COMMUNITY)
Admission: EM | Admit: 2021-01-11 | Discharge: 2021-01-11 | Disposition: A | Discharge status: Home / Self Care | Payer: Medicaid Other | Attending: Emergency Medicine | Admitting: Emergency Medicine

## 2021-01-11 DIAGNOSIS — R197 Diarrhea, unspecified: Secondary | ICD-10-CM | POA: Diagnosis not present

## 2021-01-11 DIAGNOSIS — R109 Unspecified abdominal pain: Secondary | ICD-10-CM | POA: Insufficient documentation

## 2021-01-11 DIAGNOSIS — Z5321 Procedure and treatment not carried out due to patient leaving prior to being seen by health care provider: Secondary | ICD-10-CM | POA: Diagnosis not present

## 2021-01-11 LAB — URINALYSIS, MICROSCOPIC (REFLEX)

## 2021-01-11 LAB — COMPREHENSIVE METABOLIC PANEL
ALT: 11 U/L (ref 0–44)
AST: 13 U/L — ABNORMAL LOW (ref 15–41)
Albumin: 3.4 g/dL — ABNORMAL LOW (ref 3.5–5.0)
Alkaline Phosphatase: 64 U/L (ref 38–126)
Anion gap: 9 (ref 5–15)
BUN: 14 mg/dL (ref 6–20)
CO2: 22 mmol/L (ref 22–32)
Calcium: 8.9 mg/dL (ref 8.9–10.3)
Chloride: 104 mmol/L (ref 98–111)
Creatinine, Ser: 1.02 mg/dL — ABNORMAL HIGH (ref 0.44–1.00)
GFR, Estimated: >60 mL/min (ref >60)
Glucose, Bld: 86 mg/dL (ref 70–99)
Potassium: 3.8 mmol/L (ref 3.5–5.1)
Sodium: 135 mmol/L (ref 135–145)
Total Bilirubin: 0.6 mg/dL (ref 0.3–1.2)
Total Protein: 6.8 g/dL (ref 6.5–8.1)

## 2021-01-11 LAB — LIPASE, BLOOD: Lipase: 38 U/L (ref 11–51)

## 2021-01-11 LAB — URINALYSIS, ROUTINE W REFLEX MICROSCOPIC
Bilirubin Urine: NEGATIVE
Glucose, UA: NEGATIVE mg/dL
Ketones, ur: 15 mg/dL — AB
Leukocytes,Ua: NEGATIVE
Nitrite: NEGATIVE
Protein, ur: NEGATIVE mg/dL
Specific Gravity, Urine: 1.025 (ref 1.005–1.030)
pH: 6.5 (ref 5.0–8.0)

## 2021-01-11 LAB — CBC
HCT: 42.2 % (ref 36.0–46.0)
Hemoglobin: 13.2 g/dL (ref 12.0–15.0)
MCH: 27.1 pg (ref 26.0–34.0)
MCHC: 31.3 g/dL (ref 30.0–36.0)
MCV: 86.7 fL (ref 80.0–100.0)
Platelets: 288 10*3/uL (ref 150–400)
RBC: 4.87 MIL/uL (ref 3.87–5.11)
RDW: 14.5 % (ref 11.5–15.5)
WBC: 12.4 10*3/uL — ABNORMAL HIGH (ref 4.0–10.5)
nRBC: 0 % (ref 0.0–0.2)

## 2021-01-11 LAB — I-STAT BETA HCG BLOOD, ED (MC, WL, AP ONLY): I-stat hCG, quantitative: <5 m[IU]/mL (ref <5)

## 2021-01-11 NOTE — ED Notes (Signed)
Patient left without being seen due to wait times  °

## 2021-01-11 NOTE — ED Triage Notes (Signed)
C/o mid abd pain since yesterday with diarrhea.  Denies nausea and vomiting.

## 2021-01-12 ENCOUNTER — Encounter (HOSPITAL_COMMUNITY): Payer: Self-pay

## 2021-01-12 ENCOUNTER — Ambulatory Visit (HOSPITAL_COMMUNITY)
Admission: EM | Admit: 2021-01-12 | Discharge: 2021-01-12 | Disposition: A | Discharge status: Home / Self Care | Payer: Medicaid Other | Attending: Physician Assistant | Admitting: Physician Assistant

## 2021-01-12 ENCOUNTER — Other Ambulatory Visit: Payer: Self-pay

## 2021-01-12 DIAGNOSIS — R197 Diarrhea, unspecified: Secondary | ICD-10-CM

## 2021-01-12 DIAGNOSIS — M545 Low back pain, unspecified: Secondary | ICD-10-CM | POA: Diagnosis not present

## 2021-01-12 MED ORDER — ONDANSETRON 4 MG PO TBDP
4.0000 mg | ORAL_TABLET | Freq: Once | ORAL | Status: AC
  Administered 2021-01-12: 15:00:00 4 mg via ORAL

## 2021-01-12 MED ORDER — IBUPROFEN 800 MG PO TABS
ORAL_TABLET | ORAL | Status: AC
  Filled 2021-01-12: qty 1

## 2021-01-12 MED ORDER — IBUPROFEN 800 MG PO TABS: 800.0000 mg | ORAL_TABLET | Freq: Three times a day (TID) | ORAL | 0 refills | Status: DC | PRN

## 2021-01-12 MED ORDER — ONDANSETRON 4 MG PO TBDP: 4.0000 mg | ORAL_TABLET | Freq: Three times a day (TID) | ORAL | 0 refills | Status: DC | PRN

## 2021-01-12 MED ORDER — IBUPROFEN 800 MG PO TABS
800.0000 mg | ORAL_TABLET | Freq: Once | ORAL | Status: AC
  Administered 2021-01-12: 15:00:00 800 mg via ORAL

## 2021-01-12 MED ORDER — ONDANSETRON 4 MG PO TBDP
ORAL_TABLET | ORAL | Status: AC
  Filled 2021-01-12: qty 1

## 2021-01-12 NOTE — ED Provider Notes (Signed)
[place]    CSN: [id number] Arrival date & time: 01/12/21  1306      History   Chief Complaint Chief Complaint  Patient presents with   Abdominal Pain   Back Pain    HPI Suzanne Bush is a 35 y.o. female.   Pt complains of low back pain and lower abdominal pain since yesterday.  Pt reports she has had multiple episodes of diarrhea   The history is provided by the patient. No language interpreter was used.  Abdominal Pain Pain quality: aching   Pain radiates to:  Does not radiate Pain severity:  Moderate Onset quality:  Gradual Chronicity:  New Relieved by:  Nothing Back Pain Associated symptoms: abdominal pain    Past Medical History:  Diagnosis Date   ADHD    Bipolar 1 disorder ([place])    Depression    Hx of trichomoniasis    Hypertension     Patient Active Problem List   Diagnosis Date Noted   Polysubstance abuse ([place]) 12/28/2017   Encounter for supervision of normal pregnancy, unspecified, unspecified trimester 12/06/2017   Adjustment disorder with disturbance of emotion 02/09/2017   Status post cesarean delivery 09/21/2015   Preeclampsia 09/20/2015   Gestational hypertension w/o significant proteinuria in 3rd trimester 09/13/2015   Supervision of normal pregnancy in third trimester 08/26/2015   Obesity affecting pregnancy in third trimester 08/26/2015   BMI 40.0-44.9, adult ([place]) 08/26/2015   History of cesarean delivery 08/26/2015    Past Surgical History:  Procedure Laterality Date   CESAREAN SECTION  2011   CESAREAN SECTION N/A 09/20/2015   Procedure: CESAREAN SECTION;  Surgeon: Truett Mainland, DO;  Location: [place];  Service: Obstetrics;  Laterality: N/A;   CHOLECYSTECTOMY  after baby was born    OB History     Gravida  6   Para  2   Term  2   Preterm  0   AB  3   Living  2      SAB  3   IAB  0   Ectopic  0   Multiple  0   Live Births  2        Obstetric Comments  05/2009: pLTCS for NRFHT.  7lbs 2oz. 1 layer chromic closure          Home Medications    Prior to Admission medications   Medication Sig Start Date End Date Taking? Authorizing Provider  ibuprofen (ADVIL) 800 MG tablet Take 1 tablet (800 mg total) by mouth every 8 (eight) hours as needed. 01/12/21  Yes Caryl Ada K, PA-C  ondansetron (ZOFRAN-ODT) 4 MG disintegrating tablet Take 1 tablet (4 mg total) by mouth every 8 (eight) hours as needed for nausea or vomiting. 01/12/21  Yes Caryl Ada K, PA-C  amoxicillin-clavulanate (AUGMENTIN) 875-125 MG tablet Take 1 tablet by mouth every 12 (twelve) hours. 12/27/20   Marney Setting, NP  cyclobenzaprine (FLEXERIL) 10 MG tablet Take 1 tablet (10 mg total) by mouth 3 (three) times daily as needed for muscle spasms. Patient not taking: No sig reported 09/12/19   Jaynee Eagles, PA-C  meloxicam (MOBIC) 15 MG tablet Take 1 tablet (15 mg total) by mouth daily. Patient not taking: No sig reported 09/12/19   Jaynee Eagles, PA-C  metroNIDAZOLE (FLAGYL) 500 MG tablet Take 1 tablet (500 mg total) by mouth 2 (two) times daily. Patient not taking: No sig reported 09/16/19   Chase Picket, MD  naproxen (NAPROSYN) 500 MG  tablet Take 1 tablet (500 mg total) by mouth 2 (two) times daily. Patient not taking: No sig reported 08/31/19   Gwyneth Sprout, MD  predniSONE (STERAPRED UNI-PAK 21 TAB) 10 MG (21) TBPK tablet Take by mouth daily. Take 6 tabs by mouth daily  for 2 days, then 5 tabs for 2 days, then 4 tabs for 2 days, then 3 tabs for 2 days, 2 tabs for 2 days, then 1 tab by mouth daily for 2 days 12/27/20   Coralyn Mark, NP  tinidazole (TINDAMAX) 500 MG tablet Take 4 tablets (2,000 mg total) by mouth daily with breakfast. Patient not taking: No sig reported 04/21/20   Brock Bad, MD  tiZANidine (ZANAFLEX) 4 MG tablet Take 1 tablet (4 mg total) by mouth at bedtime as needed for muscle spasms. Patient not taking: No sig reported 03/12/19   Wallis Bamberg, PA-C  Burr Medico 150-35  MCG/24HR transdermal patch Place 1 patch onto the skin once a week. Patient not taking: Reported on 08/17/2020 07/22/20   Brock Bad, MD  ferrous sulfate 325 (65 FE) MG tablet Take 1 tablet (325 mg total) by mouth 2 (two) times daily with a meal. Patient not taking: Reported on 01/08/2019 03/17/18 03/12/19  Brock Bad, MD    Family History Family History  Problem Relation Age of Onset   Hypertension Mother     Social History Social History   Tobacco Use   Smoking status: Every Day    Packs/day: 0.50    Types: Cigarettes   Smokeless tobacco: Never  Vaping Use   Vaping Use: Former  Substance Use Topics   Alcohol use: Yes    Comment: socially   Drug use: Not Currently    Frequency: 2.0 times per week    Types: Marijuana    Comment: Last smoked 12/24/2017     Allergies   Patient has no known allergies.   Review of Systems Review of Systems  Gastrointestinal:  Positive for abdominal pain.  Musculoskeletal:  Positive for back pain.  All other systems reviewed and are negative.   Physical Exam Triage Vital Signs ED Triage Vitals [01/12/21 1354]  Enc Vitals Group     BP 115/79     Pulse Rate 80     Resp 17     Temp 98.3 F (36.8 C)     Temp Source Oral     SpO2 97 %     Weight      Height      Head Circumference      Peak Flow      Pain Score 7     Pain Loc      Pain Edu?      Excl. in [place]?    No data found.  Updated Vital Signs BP 115/79 (BP Location: Right Arm)    Pulse 80    Temp 98.3 F (36.8 C) (Oral)    Resp 17    LMP 12/26/2020    SpO2 97%   Visual Acuity Right Eye Distance:   Left Eye Distance:   Bilateral Distance:    Right Eye Near:   Left Eye Near:    Bilateral Near:     Physical Exam Vitals reviewed.  Constitutional:      Appearance: She is well-developed.  Cardiovascular:     Rate and Rhythm: Normal rate.     Heart sounds: Normal heart sounds.  Pulmonary:     Effort: Pulmonary effort is normal.  Abdominal:  General: Bowel sounds are normal.     Palpations: Abdomen is soft.  Skin:    General: Skin is warm.  Neurological:     General: No focal deficit present.     Mental Status: She is alert.  Psychiatric:        Mood and Affect: Mood normal.        Behavior: Behavior normal.     UC Treatments / Results  Labs (all labs ordered are listed, but only abnormal results are displayed) Labs Reviewed - No data to display  EKG   Radiology No results found.  Procedures Procedures (including critical care time)  Medications Ordered in UC Medications  ondansetron (ZOFRAN-ODT) disintegrating tablet 4 mg (4 mg Oral Given 01/12/21 1450)  ibuprofen (ADVIL) tablet 800 mg (800 mg Oral Given 01/12/21 1450)    Initial Impression / Assessment and Plan / UC Course  I have reviewed the triage vital signs and the nursing notes.  Pertinent labs & imaging results that were available during my care of the patient were reviewed by me and considered in my medical decision making (see chart for details).     MDM:  Pt given zofran and ibuprofen  Final Clinical Impressions(s) / UC Diagnoses   Final diagnoses:  Acute low back pain, unspecified back pain laterality, unspecified whether sciatica present  Diarrhea, unspecified type     Discharge Instructions      Return if any problems.     ED Prescriptions     Medication Sig Dispense Auth. Provider   ondansetron (ZOFRAN-ODT) 4 MG disintegrating tablet Take 1 tablet (4 mg total) by mouth every 8 (eight) hours as needed for nausea or vomiting. 20 tablet Shaasia Odle K, [place]   ibuprofen (ADVIL) 800 MG tablet Take 1 tablet (800 mg total) by mouth every 8 (eight) hours as needed. 30 tablet Fransico Meadow, [place]      PDMP not reviewed this encounter. An After Visit Summary was printed and given to the patient.    Fransico Meadow, [place] 01/12/21 1935

## 2021-01-12 NOTE — ED Triage Notes (Signed)
C/o generalized pain since yesterday with diarrhea and back pain.  Denies nausea and vomiting.

## 2021-01-12 NOTE — Discharge Instructions (Addendum)
Return if any problems.

## 2021-01-13 ENCOUNTER — Encounter (HOSPITAL_COMMUNITY): Payer: Self-pay

## 2021-03-28 ENCOUNTER — Other Ambulatory Visit: Payer: Self-pay

## 2021-03-28 ENCOUNTER — Ambulatory Visit (HOSPITAL_COMMUNITY)
Admission: EM | Admit: 2021-03-28 | Discharge: 2021-03-28 | Disposition: A | Discharge status: Home / Self Care | Payer: Medicaid Other | Attending: Internal Medicine | Admitting: Internal Medicine

## 2021-03-28 ENCOUNTER — Encounter (HOSPITAL_COMMUNITY): Payer: Self-pay

## 2021-03-28 DIAGNOSIS — J301 Allergic rhinitis due to pollen: Secondary | ICD-10-CM

## 2021-03-28 DIAGNOSIS — R102 Pelvic and perineal pain: Secondary | ICD-10-CM

## 2021-03-28 DIAGNOSIS — R0981 Nasal congestion: Secondary | ICD-10-CM

## 2021-03-28 LAB — POCT URINALYSIS DIPSTICK, ED / UC
Bilirubin Urine: NEGATIVE
Glucose, UA: 100 mg/dL — AB
Ketones, ur: NEGATIVE mg/dL
Nitrite: NEGATIVE
Protein, ur: NEGATIVE mg/dL
Specific Gravity, Urine: >=1.03 (ref 1.005–1.030)
Urobilinogen, UA: 0.2 mg/dL (ref 0.0–1.0)
pH: 6 (ref 5.0–8.0)

## 2021-03-28 LAB — POC URINE PREG, ED: Preg Test, Ur: NEGATIVE

## 2021-03-28 MED ORDER — IBUPROFEN 600 MG PO TABS: 600.0000 mg | ORAL_TABLET | Freq: Three times a day (TID) | ORAL | 0 refills | Status: AC | PRN

## 2021-03-28 MED ORDER — FLUTICASONE PROPIONATE 50 MCG/ACT NA SUSP: 2.0000 | Freq: Every day | NASAL | 0 refills | Status: DC

## 2021-03-28 NOTE — Discharge Instructions (Addendum)
Discussed lab results with patient. Reviewed urinalysis with patient.  Discussed glucose found in her urine on urinalysis.  Patient currently does not have PCP, will make recommendations for PCP for follow-up. Take medications as prescribed. Follow-up with urgent care if symptoms worsen or do not improve.

## 2021-03-28 NOTE — ED Triage Notes (Signed)
Pt c/o lower abd pain that worsens with cough, sneeze and intercourse. Started: a week ago   Pt has congestion that began today.

## 2021-03-28 NOTE — ED Provider Notes (Addendum)
[facility]    CSN: [id number] Arrival date & time: 03/28/21  [id number]      History   Chief Complaint Chief Complaint  Patient presents with   Nasal Congestion   Abdominal Pain    HPI Suzanne Bush is a 36 y.o. female.   The patient presents with bilateral lower abdominal pain x 1 week. LMP: 03/12/21. Also complains of nasal congestion that worsened today.    Abdominal Pain Pain location:  Suprapubic Pain radiates to:  Suprapubic region Pain severity:  Moderate Duration:  1 week Timing:  Constant Progression:  Worsening Context: previous surgery (C-section and cholecystectomy) and recent sexual activity   Context: not eating and not suspicious food intake   Associated symptoms: constipation, diarrhea and nausea   Associated symptoms: no dysuria, no fever, no vaginal bleeding, no vaginal discharge and no vomiting   Diarrhea:    Severity:  Mild   Duration:  2 days URI Presenting symptoms: congestion   Presenting symptoms: no fever   Severity:  Moderate Duration:  1 day Progression:  Worsening Chronicity:  Recurrent Relieved by:  OTC medications Associated symptoms: no neck pain and no wheezing    Past Medical History:  Diagnosis Date   ADHD    Bipolar 1 disorder ([place])    Depression    Hx of trichomoniasis    Hypertension     Patient Active Problem List   Diagnosis Date Noted   Polysubstance abuse ([place]) 12/28/2017   Encounter for supervision of normal pregnancy, unspecified, unspecified trimester 12/06/2017   Adjustment disorder with disturbance of emotion 02/09/2017   Status post cesarean delivery 09/21/2015   Preeclampsia 09/20/2015   Gestational hypertension w/o significant proteinuria in 3rd trimester 09/13/2015   Supervision of normal pregnancy in third trimester 08/26/2015   Obesity affecting pregnancy in third trimester 08/26/2015   BMI 40.0-44.9, adult ([place]) 08/26/2015   History of cesarean delivery 08/26/2015    Past Surgical  History:  Procedure Laterality Date   CESAREAN SECTION  2011   CESAREAN SECTION N/A 09/20/2015   Procedure: CESAREAN SECTION;  Surgeon: Truett Mainland, DO;  Location: [facility];  Service: Obstetrics;  Laterality: N/A;   CHOLECYSTECTOMY  after baby was born    OB History     Gravida  6   Para  2   Term  2   Preterm  0   AB  3   Living  2      SAB  3   IAB  0   Ectopic  0   Multiple  0   Live Births  2        Obstetric Comments  05/2009: pLTCS for NRFHT. 7lbs 2oz. 1 layer chromic closure          Home Medications    Prior to Admission medications   Medication Sig Start Date End Date Taking? Authorizing Provider  amoxicillin-clavulanate (AUGMENTIN) 875-125 MG tablet Take 1 tablet by mouth every 12 (twelve) hours. 12/27/20   Marney Setting, NP  cyclobenzaprine (FLEXERIL) 10 MG tablet Take 1 tablet (10 mg total) by mouth 3 (three) times daily as needed for muscle spasms. Patient not taking: No sig reported 09/12/19   Jaynee Eagles, PA-C  ibuprofen (ADVIL) 800 MG tablet Take 1 tablet (800 mg total) by mouth every 8 (eight) hours as needed. 01/12/21   Fransico Meadow, PA-C  meloxicam (MOBIC) 15 MG tablet Take 1 tablet (15 mg total) by mouth daily. Patient not taking: No  sig reported 09/12/19   Jaynee Eagles, PA-C  metroNIDAZOLE (FLAGYL) 500 MG tablet Take 1 tablet (500 mg total) by mouth 2 (two) times daily. Patient not taking: No sig reported 09/16/19   Chase Picket, MD  naproxen (NAPROSYN) 500 MG tablet Take 1 tablet (500 mg total) by mouth 2 (two) times daily. Patient not taking: No sig reported 08/31/19   Blanchie Dessert, MD  ondansetron (ZOFRAN-ODT) 4 MG disintegrating tablet Take 1 tablet (4 mg total) by mouth every 8 (eight) hours as needed for nausea or vomiting. 01/12/21   Fransico Meadow, PA-C  predniSONE (STERAPRED UNI-PAK 21 TAB) 10 MG (21) TBPK tablet Take by mouth daily. Take 6 tabs by mouth daily  for 2 days, then 5 tabs for 2 days, then 4  tabs for 2 days, then 3 tabs for 2 days, 2 tabs for 2 days, then 1 tab by mouth daily for 2 days 12/27/20   Marney Setting, NP  tinidazole (TINDAMAX) 500 MG tablet Take 4 tablets (2,000 mg total) by mouth daily with breakfast. Patient not taking: No sig reported 04/21/20   Shelly Bombard, MD  tiZANidine (ZANAFLEX) 4 MG tablet Take 1 tablet (4 mg total) by mouth at bedtime as needed for muscle spasms. Patient not taking: No sig reported 03/12/19   Jaynee Eagles, PA-C  Suzanne Bush 150-35 MCG/24HR transdermal patch Place 1 patch onto the skin once a week. Patient not taking: Reported on 08/17/2020 07/22/20   Shelly Bombard, MD  ferrous sulfate 325 (65 FE) MG tablet Take 1 tablet (325 mg total) by mouth 2 (two) times daily with a meal. Patient not taking: Reported on 01/08/2019 03/17/18 03/12/19  Shelly Bombard, MD    Family History Family History  Problem Relation Age of Onset   Hypertension Mother     Social History Social History   Tobacco Use   Smoking status: Every Day    Packs/day: 0.50    Types: Cigarettes   Smokeless tobacco: Never  Vaping Use   Vaping Use: Former  Substance Use Topics   Alcohol use: Yes    Comment: socially   Drug use: Not Currently    Frequency: 2.0 times per week    Types: Marijuana    Comment: Last smoked 12/24/2017     Allergies   Patient has no known allergies.   Review of Systems Review of Systems  Constitutional:  Negative for fever.  HENT:  Positive for congestion.   Respiratory:  Negative for wheezing.   Gastrointestinal:  Positive for abdominal pain, constipation, diarrhea and nausea. Negative for vomiting.  Genitourinary:  Negative for dysuria, vaginal bleeding and vaginal discharge.  Musculoskeletal:  Negative for neck pain.    Physical Exam Triage Vital Signs ED Triage Vitals  Enc Vitals Group     BP 03/28/21 1016 131/90     Pulse Rate 03/28/21 1016 75     Resp 03/28/21 1016 20     Temp 03/28/21 1016 98.7 F (37.1 C)      Temp Source 03/28/21 1016 Oral     SpO2 03/28/21 1016 97 %     Weight --      Height --      Head Circumference --      Peak Flow --      Pain Score 03/28/21 1015 7     Pain Loc --      Pain Edu? --      Excl. in GC? --    No  data found.  Updated Vital Signs BP 131/90 (BP Location: Left Arm)    Pulse 75    Temp 98.7 F (37.1 C) (Oral)    Resp 20    LMP 03/12/2021    SpO2 97%   Visual Acuity Right Eye Distance:   Left Eye Distance:   Bilateral Distance:    Right Eye Near:   Left Eye Near:    Bilateral Near:     Physical Exam Constitutional:      General: She is not in acute distress.    Appearance: She is well-developed.  Pulmonary:     Effort: Pulmonary effort is normal.     Breath sounds: Normal breath sounds.  Abdominal:     General: Bowel sounds are normal. There is no distension.     Palpations: Abdomen is soft.     Tenderness: There is abdominal tenderness in the suprapubic area. There is no right CVA tenderness, left CVA tenderness, guarding or rebound.  Skin:    General: Skin is warm and dry.  Neurological:     Mental Status: She is alert.     UC Treatments / Results  Labs (all labs ordered are listed, but only abnormal results are displayed) Labs Reviewed - No data to display  EKG   Radiology No results found.  Procedures Procedures (including critical care time)  Medications Ordered in UC Medications - No data to display  Initial Impression / Assessment and Plan / UC Course  I have reviewed the triage vital signs and the nursing notes.  Pertinent labs & imaging results that were available during my care of the patient were reviewed by me and considered in my medical decision making (see chart for details).    Review urinalysis and pregnancy pregnancy results with patient.  Physical exam and symptoms do not indicate acute abdomen, UTI or pyelonephritis at this time.  Urinalysis indicates possible diabetes mellitus.  Recommend establishing  care with PCP for follow-up.  No fever, chills, rebound tenderness, guarding, or CVA tenderness.  Symptoms have been persistent for approximately 1 week.  Will recommend ibuprofen to see if this helps her symptoms.  We will also prescribe fluticasone for nasal symptoms along with Zyrtec for allergic rhinitis.  Final Clinical Impressions(s) / UC Diagnoses   Final diagnoses:  None   Discharge Instructions   None    ED Prescriptions   None    PDMP not reviewed this encounter.   Tish Men, NP 03/28/21 1106    Minka Knight-Warren, Alda Lea, NP 03/28/21 1523

## 2021-03-29 ENCOUNTER — Ambulatory Visit (HOSPITAL_COMMUNITY)
Admission: EM | Admit: 2021-03-29 | Discharge: 2021-03-29 | Disposition: A | Discharge status: Home / Self Care | Payer: Medicaid Other | Attending: Internal Medicine | Admitting: Internal Medicine

## 2021-03-29 DIAGNOSIS — Z113 Encounter for screening for infections with a predominantly sexual mode of transmission: Secondary | ICD-10-CM | POA: Diagnosis present

## 2021-03-29 NOTE — ED Notes (Signed)
Patient seen yesterday, needed order for cyto swab, swab on site at this time

## 2021-03-30 ENCOUNTER — Telehealth (HOSPITAL_COMMUNITY): Payer: Self-pay | Admitting: Emergency Medicine

## 2021-03-30 ENCOUNTER — Encounter (HOSPITAL_COMMUNITY): Payer: Self-pay

## 2021-03-30 LAB — CERVICOVAGINAL ANCILLARY ONLY
Bacterial Vaginitis (gardnerella): POSITIVE — AB
Candida Glabrata: NEGATIVE
Candida Vaginitis: POSITIVE — AB
Chlamydia: NEGATIVE
Comment: NEGATIVE
Comment: NEGATIVE
Comment: NEGATIVE
Comment: NEGATIVE
Comment: NEGATIVE
Comment: NORMAL
Neisseria Gonorrhea: NEGATIVE
Trichomonas: NEGATIVE

## 2021-03-30 MED ORDER — METRONIDAZOLE 500 MG PO TABS: 500.0000 mg | ORAL_TABLET | Freq: Two times a day (BID) | ORAL | 0 refills | Status: DC

## 2021-03-30 MED ORDER — FLUCONAZOLE 150 MG PO TABS: 150.0000 mg | ORAL_TABLET | Freq: Once | ORAL | 0 refills | Status: AC

## 2021-04-19 ENCOUNTER — Encounter: Payer: Self-pay | Admitting: Obstetrics

## 2021-04-19 ENCOUNTER — Other Ambulatory Visit: Payer: Self-pay | Admitting: Nurse Practitioner

## 2021-04-27 ENCOUNTER — Ambulatory Visit: Payer: Medicaid Other | Admitting: Obstetrics

## 2021-05-04 ENCOUNTER — Other Ambulatory Visit (HOSPITAL_COMMUNITY)
Admission: RE | Admit: 2021-05-04 | Discharge: 2021-05-04 | Disposition: A | Discharge status: Home / Self Care | Payer: Medicaid Other | Source: Ambulatory Visit | Attending: Obstetrics | Admitting: Obstetrics

## 2021-05-04 ENCOUNTER — Ambulatory Visit (INDEPENDENT_AMBULATORY_CARE_PROVIDER_SITE_OTHER): Payer: Medicaid Other | Admitting: Obstetrics

## 2021-05-04 ENCOUNTER — Encounter: Payer: Self-pay | Admitting: Obstetrics

## 2021-05-04 VITALS — BP 123/87 | HR 67 | Ht 65.0 in | Wt 203.5 lb

## 2021-05-04 DIAGNOSIS — E669 Obesity, unspecified: Secondary | ICD-10-CM | POA: Diagnosis not present

## 2021-05-04 DIAGNOSIS — N946 Dysmenorrhea, unspecified: Secondary | ICD-10-CM | POA: Diagnosis not present

## 2021-05-04 DIAGNOSIS — Z6833 Body mass index (BMI) 33.0-33.9, adult: Secondary | ICD-10-CM

## 2021-05-04 DIAGNOSIS — Z113 Encounter for screening for infections with a predominantly sexual mode of transmission: Secondary | ICD-10-CM

## 2021-05-04 DIAGNOSIS — N898 Other specified noninflammatory disorders of vagina: Secondary | ICD-10-CM | POA: Diagnosis not present

## 2021-05-04 DIAGNOSIS — J309 Allergic rhinitis, unspecified: Secondary | ICD-10-CM | POA: Diagnosis not present

## 2021-05-04 DIAGNOSIS — Z01419 Encounter for gynecological examination (general) (routine) without abnormal findings: Secondary | ICD-10-CM | POA: Insufficient documentation

## 2021-05-04 DIAGNOSIS — Z6834 Body mass index (BMI) 34.0-34.9, adult: Secondary | ICD-10-CM

## 2021-05-04 DIAGNOSIS — J301 Allergic rhinitis due to pollen: Secondary | ICD-10-CM

## 2021-05-04 DIAGNOSIS — Z30016 Encounter for initial prescription of transdermal patch hormonal contraceptive device: Secondary | ICD-10-CM

## 2021-05-04 MED ORDER — IBUPROFEN 800 MG PO TABS: 800.0000 mg | ORAL_TABLET | Freq: Three times a day (TID) | ORAL | 5 refills | Status: DC | PRN

## 2021-05-04 MED ORDER — LORATADINE 10 MG PO TABS: 10.0000 mg | ORAL_TABLET | Freq: Every day | ORAL | 11 refills | Status: DC

## 2021-05-04 MED ORDER — XULANE 150-35 MCG/24HR TD PTWK: 1.0000 | MEDICATED_PATCH | TRANSDERMAL | 12 refills | Status: DC

## 2021-05-04 NOTE — Progress Notes (Signed)
? ?Subjective: ? ? ?  ?  ? Suzanne Bush is a 36 y.o. female here for a routine exam.  Current complaints: Vaginal discharge.   ? ?Personal health questionnaire:  ?Is patient Ashkenazi Jewish, have a family history of breast and/or ovarian cancer: no ?Is there a family history of uterine cancer diagnosed at age < 27, gastrointestinal cancer, urinary tract cancer, family member who is a Personnel officer syndrome-associated carrier: no ?Is the patient overweight and hypertensive, family history of diabetes, personal history of gestational diabetes, preeclampsia or PCOS: no ?Is patient over 58, have PCOS,  family history of premature CHD under age 73, diabetes, smoke, have hypertension or peripheral artery disease:  no ?At any time, has a partner hit, kicked or otherwise hurt or frightened you?: no ?Over the past 2 weeks, have you felt down, depressed or hopeless?: no ?Over the past 2 weeks, have you felt little interest or pleasure in doing things?:no ? ? ?Gynecologic History ?Patient's last menstrual period was 04/01/2021 (approximate). ?Contraception:  Xulane Patches weekly ?Last Pap: 10-14-2020. Results were: normal ?Last mammogram: n/a. Results were: n/a ? ?Obstetric History ?OB History  ?Gravida Para Term Preterm AB Living  ?6 2 2  0 3 2  ?SAB IAB Ectopic Multiple Live Births  ?3 0 0 0 2  ?  ?# Outcome Date GA Lbr Len/2nd Weight Sex Delivery Anes PTL Lv  ?6 Gravida           ?5 Term 09/21/15 [redacted]w[redacted]d   M CS-LTranv Spinal  LIV  ?   Complications: Hypertension in pregnancy, preeclampsia, severe, third trimester  ?4 Term 06/04/09    F CS-LTranv   LIV  ?3 SAB           ?2 SAB           ?1 SAB           ?  ?Obstetric Comments  ?05/2009: pLTCS for NRFHT. 7lbs 2oz. 1 layer chromic closure  ? ? ?Past Medical History:  ?Diagnosis Date  ? ADHD   ? Bipolar 1 disorder ([facility])   ? Depression   ? Hx of trichomoniasis   ? Hypertension   ?  ?Past Surgical History:  ?Procedure Laterality Date  ? CESAREAN SECTION  2011  ? CESAREAN SECTION N/A  09/20/2015  ? Procedure: CESAREAN SECTION;  Surgeon: 09/22/2015, DO;  Location: [facility] BIRTHING SU[facility];  Service: Obstetrics;  Laterality: N/A;  ? CHOLECYSTECTOMY  after baby was born  ?  ? ?Current Outpatient Medications:  ?  ibuprofen (ADVIL) 800 MG tablet, Take 1 tablet (800 mg total) by mouth every 8 (eight) hours as needed., Disp: 30 tablet, Rfl: 5 ?  loratadine (CLARITIN) 10 MG tablet, Take 1 tablet (10 mg total) by mouth daily., Disp: 30 tablet, Rfl: 11 ?  fluticasone (FLONASE) 50 MCG/ACT nasal spray, Place 2 sprays into both nostrils daily for 14 days., Disp: 16 g, Rfl: 0 ?  naproxen (NAPROSYN) 500 MG tablet, Take 1 tablet (500 mg total) by mouth 2 (two) times daily. (Patient not taking: No sig reported), Disp: 28 tablet, Rfl: 0 ?  XULANE 150-35 MCG/24HR transdermal patch, Place 1 patch onto the skin once a week., Disp: 3 patch, Rfl: 12 ?No Known Allergies  ?Social History  ? ?Tobacco Use  ? Smoking status: Every Day  ?  Packs/day: 0.50  ?  Types: Cigarettes  ? Smokeless tobacco: Never  ?Substance Use Topics  ? Alcohol use: Yes  ?  Comment: socially  ?  ?  Family History  ?Problem Relation Age of Onset  ? Hypertension Mother   ?  ? ? ?Review of Systems ? ?Constitutional: negative for fatigue and weight loss ?Respiratory: negative for cough and wheezing ?Cardiovascular: negative for chest pain, fatigue and palpitations ?Gastrointestinal: negative for abdominal pain and change in bowel habits ?Musculoskeletal:negative for myalgias ?Neurological: negative for gait problems and tremors ?Behavioral/Psych: negative for abusive relationship, depression ?Endocrine: negative for temperature intolerance    ?Genitourinary:negative for abnormal menstrual periods, genital lesions, hot flashes, sexual problems and vaginal discharge ?Integument/breast: negative for breast lump, breast tenderness, nipple discharge and skin lesion(s) ? ?  ?Objective:  ? ?    ?BP 123/87   Pulse 67   Ht 5\' 5"  (1.651 m)   Wt 203 lb 8 oz (92.3  kg)   LMP 04/01/2021 (Approximate)   BMI 33.86 kg/m?  ?General:   alert  ?Skin:   no rash or abnormalities  ?Lungs:   clear to auscultation bilaterally  ?Heart:   regular rate and rhythm, S1, S2 normal, no murmur, click, rub or gallop  ?Breasts:   normal without suspicious masses, skin or nipple changes or axillary nodes  ?Abdomen:  normal findings: no organomegaly, soft, non-tender and no hernia  ?Pelvis:  External genitalia: normal general appearance ?Urinary system: urethral meatus normal and bladder without fullness, nontender ?Vaginal: normal without tenderness, induration or masses ?Cervix: normal appearance ?Adnexa: normal bimanual exam ?Uterus: anteverted and non-tender, normal size  ? ?Lab Review ?Urine pregnancy test ?Labs reviewed yes ?Radiologic studies reviewed no ? ?I have spent a total of 20 minutes of face-to-face time, excluding clinical staff time, reviewing notes and preparing to see patient, ordering tests and/or medications, and counseling the patient.  ? ?Assessment:  ? ? 1. Encounter for gynecological examination with Papanicolaou smear of cervix ?Rx: ?- Cytology - PAP( [facility]) ? ?2. Dysmenorrhea ?Rx: ?- ibuprofen (ADVIL) 800 MG tablet; Take 1 tablet (800 mg total) by mouth every 8 (eight) hours as needed.  Dispense: 30 tablet; Refill: 5 ? ?3. Vaginal discharge ?Rx: ?- Cervicovaginal ancillary only( [place] [facility]) ? ?4. Screening for STD (sexually transmitted disease) ?Rx: ?- HIV Antibody (routine testing w rflx) ?- Hepatitis B surface antigen ?- RPR ?- Hepatitis C antibody ? ?5. Encounter for initial prescription of transdermal patch hormonal contraceptive device ?Rx: ?- XULANE 150-35 MCG/24HR transdermal patch; Place 1 patch onto the skin once a week.  Dispense: 3 patch; Refill: 12 ? ?6. Seasonal allergic rhinitis due to pollen ?Rx: ?- loratadine (CLARITIN) 10 MG tablet; Take 1 tablet (10 mg total) by mouth daily.  Dispense: 30 tablet; Refill: 11 ? ?7. Obesity (BMI 30.0-34.9) ?- weight  reduction with the aid of dietary changes, execise and behavioral modification recommended ?  ?  ?Plan:  ? ? Education reviewed: calcium supplements, depression evaluation, low fat, low cholesterol diet, safe sex/STD prevention, self breast exams, smoking cessation, and weight bearing exercise. ?Contraception: 06-03-1992 weekly. ?Follow up in: 1 year.  ? ?Meds ordered this encounter  ?Medications  ? XULANE 150-35 MCG/24HR transdermal patch  ?  Sig: Place 1 patch onto the skin once a week.  ?  Dispense:  3 patch  ?  Refill:  12  ? loratadine (CLARITIN) 10 MG tablet  ?  Sig: Take 1 tablet (10 mg total) by mouth daily.  ?  Dispense:  30 tablet  ?  Refill:  11  ? ibuprofen (ADVIL) 800 MG tablet  ?  Sig: Take 1 tablet (800 mg total)  by mouth every 8 (eight) hours as needed.  ?  Dispense:  30 tablet  ?  Refill:  5  ? ?Orders Placed This Encounter  ?Procedures  ? HIV Antibody (routine testing w rflx)  ? Hepatitis B surface antigen  ? RPR  ? Hepatitis C antibody  ? ? ? Brock BadHarper, Kristapher [place] A, MD ?05/04/2021 10:33 AM  ?

## 2021-05-05 ENCOUNTER — Other Ambulatory Visit: Payer: Self-pay | Admitting: Obstetrics

## 2021-05-05 ENCOUNTER — Telehealth: Payer: Self-pay | Admitting: Emergency Medicine

## 2021-05-05 ENCOUNTER — Encounter: Payer: Self-pay | Admitting: Emergency Medicine

## 2021-05-05 DIAGNOSIS — B9689 Other specified bacterial agents as the cause of diseases classified elsewhere: Secondary | ICD-10-CM

## 2021-05-05 DIAGNOSIS — A749 Chlamydial infection, unspecified: Secondary | ICD-10-CM

## 2021-05-05 LAB — CERVICOVAGINAL ANCILLARY ONLY
Bacterial Vaginitis (gardnerella): POSITIVE — AB
Candida Glabrata: NEGATIVE
Candida Vaginitis: NEGATIVE
Chlamydia: POSITIVE — AB
Comment: NEGATIVE
Comment: NEGATIVE
Comment: NEGATIVE
Comment: NEGATIVE
Comment: NEGATIVE
Comment: NORMAL
Neisseria Gonorrhea: NEGATIVE
Trichomonas: NEGATIVE

## 2021-05-05 LAB — CYTOLOGY - PAP
Comment: NEGATIVE
Diagnosis: NEGATIVE
High risk HPV: NEGATIVE

## 2021-05-05 MED ORDER — METRONIDAZOLE 500 MG PO TABS: 500.0000 mg | ORAL_TABLET | Freq: Two times a day (BID) | ORAL | 2 refills | Status: DC

## 2021-05-05 MED ORDER — DOXYCYCLINE HYCLATE 100 MG PO CAPS: 100.0000 mg | ORAL_CAPSULE | Freq: Two times a day (BID) | ORAL | 0 refills | Status: DC

## 2021-05-05 NOTE — Telephone Encounter (Signed)
Left vm to return call to clinic to discuss results. Mychart message sent. Rx sent to pharmacy. ?

## 2021-05-07 ENCOUNTER — Other Ambulatory Visit: Payer: Self-pay | Admitting: Obstetrics

## 2021-05-07 DIAGNOSIS — A749 Chlamydial infection, unspecified: Secondary | ICD-10-CM

## 2021-05-13 LAB — HIV ANTIBODY (ROUTINE TESTING W REFLEX): HIV Screen 4th Generation wRfx: NONREACTIVE

## 2021-05-13 LAB — HEPATITIS C ANTIBODY

## 2021-05-13 LAB — HEPATITIS B SURFACE ANTIGEN

## 2021-05-13 LAB — RPR: RPR Ser Ql: NONREACTIVE

## 2022-05-15 ENCOUNTER — Encounter: Payer: Self-pay | Admitting: *Deleted

## 2023-06-24 ENCOUNTER — Ambulatory Visit (INDEPENDENT_AMBULATORY_CARE_PROVIDER_SITE_OTHER)

## 2023-06-24 ENCOUNTER — Encounter (HOSPITAL_COMMUNITY): Payer: Self-pay

## 2023-06-24 ENCOUNTER — Ambulatory Visit (HOSPITAL_COMMUNITY)
Admission: EM | Admit: 2023-06-24 | Discharge: 2023-06-24 | Disposition: A | Discharge status: Home / Self Care | Attending: Emergency Medicine | Admitting: Emergency Medicine

## 2023-06-24 DIAGNOSIS — M25511 Pain in right shoulder: Secondary | ICD-10-CM

## 2023-06-24 MED ORDER — NAPROXEN 500 MG PO TABS: 500.0000 mg | ORAL_TABLET | Freq: Two times a day (BID) | ORAL | 0 refills | Status: DC

## 2023-06-24 NOTE — ED Provider Notes (Signed)
 [facility]    CSN: [id number] Arrival date & time: 06/24/23  1158      History   Chief Complaint Chief Complaint  Patient presents with   Shoulder Pain    HPI Suzanne Bush is a 38 y.o. female.   Patient presents with right shoulder pain x 2 to 3 weeks.  Patient states that she initially thought maybe she just slept on her shoulder wrong but then the pain has continued.  Patient states at times the pain will radiate down her right arm.  Patient denies any recent falls or known injury.  Patient reports increased pain with movement.    Patient reports she has been taking ibuprofen  with minimal relief. Denies numbness, tingling, weakness, neck pain, back pain, and chest pain.  The history is provided by the patient and medical records.  Shoulder Pain   Past Medical History:  Diagnosis Date   ADHD    Bipolar 1 disorder ([facility])    Depression    Hx of trichomoniasis    Hypertension     Patient Active Problem List   Diagnosis Date Noted   Polysubstance abuse ([facility]) 12/28/2017   Encounter for supervision of normal pregnancy, unspecified, unspecified trimester 12/06/2017   Adjustment disorder with disturbance of emotion 02/09/2017   Status post cesarean delivery 09/21/2015   Preeclampsia 09/20/2015   Gestational hypertension w/o significant proteinuria in 3rd trimester 09/13/2015   Supervision of normal pregnancy in third trimester 08/26/2015   Obesity affecting pregnancy in third trimester 08/26/2015   BMI 40.0-44.9, adult ([facility]) 08/26/2015   History of cesarean delivery 08/26/2015    Past Surgical History:  Procedure Laterality Date   CESAREAN SECTION  2011   CESAREAN SECTION N/A 09/20/2015   Procedure: CESAREAN SECTION;  Surgeon: Malka Sea, DO;  Location: [facility];  Service: Obstetrics;  Laterality: N/A;   CHOLECYSTECTOMY  after baby was born    OB History     Gravida  6   Para  2   Term  2   Preterm  0   AB  3   Living  2       SAB  3   IAB  0   Ectopic  0   Multiple  0   Live Births  2        Obstetric Comments  05/2009: pLTCS for NRFHT. 7lbs 2oz. 1 layer chromic closure          Home Medications    Prior to Admission medications   Medication Sig Start Date End Date Taking? Authorizing Provider  naproxen  (NAPROSYN ) 500 MG tablet Take 1 tablet (500 mg total) by mouth 2 (two) times daily. 06/24/23  Yes Rosevelt Constable, Sherma Vanmetre A, NP  loratadine  (CLARITIN ) 10 MG tablet Take 1 tablet (10 mg total) by mouth daily. 05/04/21   Gabrielle Joiner, MD  XULANE  150-35 MCG/24HR transdermal patch Place 1 patch onto the skin once a week. Patient not taking: Reported on 06/24/2023 05/04/21   Gabrielle Joiner, MD  ferrous sulfate  325 (65 FE) MG tablet Take 1 tablet (325 mg total) by mouth 2 (two) times daily with a meal. Patient not taking: Reported on 01/08/2019 03/17/18 03/12/19  Gabrielle Joiner, MD    Family History Family History  Problem Relation Age of Onset   Hypertension Mother     Social History Social History   Tobacco Use   Smoking status: Every Day    Current packs/day: 0.50    Types: Cigarettes  Smokeless tobacco: Never  Vaping Use   Vaping status: Former  Substance Use Topics   Alcohol use: Yes    Comment: socially   Drug use: Not Currently    Frequency: 2.0 times per week    Types: Marijuana    Comment: Last smoked 12/24/2017     Allergies   Patient has no known allergies.   Review of Systems Review of Systems  Per HPI  Physical Exam Triage Vital Signs ED Triage Vitals  Encounter Vitals Group     BP 06/24/23 1218 (!) 146/94     Systolic BP Percentile --      Diastolic BP Percentile --      Pulse Rate 06/24/23 1218 71     Resp 06/24/23 1218 16     Temp 06/24/23 1218 98.2 F (36.8 C)     Temp Source 06/24/23 1218 Oral     SpO2 06/24/23 1218 99 %     Weight --      Height --      Head Circumference --      Peak Flow --      Pain Score 06/24/23 1216 6     Pain Loc --       Pain Education --      Exclude from Growth Chart --    No data found.  Updated Vital Signs BP (!) 146/94 (BP Location: Left Arm)   Pulse 71   Temp 98.2 F (36.8 C) (Oral)   Resp 16   LMP 06/23/2023 (Exact Date)   SpO2 99%   Breastfeeding No   Visual Acuity Right Eye Distance:   Left Eye Distance:   Bilateral Distance:    Right Eye Near:   Left Eye Near:    Bilateral Near:     Physical Exam Vitals and nursing note reviewed.  Constitutional:      General: She is awake. She is not in acute distress.    Appearance: Normal appearance. She is well-developed and well-groomed. She is not ill-appearing.  Musculoskeletal:     Right shoulder: Tenderness present. No swelling, deformity or effusion. Normal range of motion.     Comments: Tenderness to superior and posterior aspect of right shoulder.  Endorses increased pain with flexion, internal rotation, and external rotation of right shoulder  Skin:    General: Skin is warm and dry.  Neurological:     Mental Status: She is alert.  Psychiatric:        Behavior: Behavior is cooperative.      UC Treatments / Results  Labs (all labs ordered are listed, but only abnormal results are displayed) Labs Reviewed - No data to display  EKG   Radiology DG Shoulder Right Result Date: 06/24/2023 CLINICAL DATA:  Shoulder pain for 2 weeks EXAM: RIGHT SHOULDER - 2+ VIEW COMPARISON:  01/31/2012 FINDINGS: There is no evidence of fracture or dislocation. There is no evidence of arthropathy or other focal bone abnormality. Soft tissues are unremarkable. IMPRESSION: No acute or significant finding by plain radiography Electronically Signed   By: Melven Stable.  Shick M.D.   On: 06/24/2023 13:36    Procedures Procedures (including critical care time)  Medications Ordered in UC Medications - No data to display  Initial Impression / Assessment and Plan / UC Course  I have reviewed the triage vital signs and the nursing notes.  Pertinent labs &  imaging results that were available during my care of the patient were reviewed by me and considered in my medical  decision making (see chart for details).     Patient is well-appearing.  Vitals are stable.  There is tenderness noted to the superior and posterior aspect of the right shoulder.  Patient endorses increased pain with flexion, internal rotation, and external rotation of the right shoulder.  X-ray ordered.  Based on my interpretation there is no acute fracture or underlying injury.  Radiology report confirms this.  Prescribe naproxen  as needed for pain.  Recommended alternating with Tylenol  if needed.  Given orthopedic follow-up.  Discussed follow-up and return precautions. Final Clinical Impressions(s) / UC Diagnoses   Final diagnoses:  Acute pain of right shoulder     Discharge Instructions      Your x-ray is negative for any fracture or underlying injury. Take naproxen  twice daily as needed for pain.  Do not take this with other NSAIDs including ibuprofen , Motrin , Advil , Aleve , and Goody powder. Otherwise you can take 650 mg of Tylenol  every 6-8 hours as needed for breakthrough pain. Apply ice and heat as needed and do some gentle stretching to help with pain. Follow-up with [place] sports medicine if your pain continues for further evaluation and management. Return here as needed.   ED Prescriptions     Medication Sig Dispense Auth. Provider   naproxen  (NAPROSYN ) 500 MG tablet Take 1 tablet (500 mg total) by mouth 2 (two) times daily. 30 tablet Levora Reas A, NP      PDMP not reviewed this encounter.   Levora Reas A, NP 06/24/23 1343

## 2023-06-24 NOTE — Discharge Instructions (Addendum)
 Your x-ray is negative for any fracture or underlying injury. Take naproxen  twice daily as needed for pain.  Do not take this with other NSAIDs including ibuprofen , Motrin , Advil , Aleve , and Goody powder. Otherwise you can take 650 mg of Tylenol  every 6-8 hours as needed for breakthrough pain. Apply ice and heat as needed and do some gentle stretching to help with pain. Follow-up with [place] sports medicine if your pain continues for further evaluation and management. Return here as needed.

## 2023-06-24 NOTE — ED Triage Notes (Signed)
 Patient here today with c/o right shoulder pain X 2 weeks. Patient states that she has increased pain with ROM. She tried taking Ibuprofen  with no relief. No known injury.

## 2023-07-27 ENCOUNTER — Ambulatory Visit (INDEPENDENT_AMBULATORY_CARE_PROVIDER_SITE_OTHER)

## 2023-07-27 ENCOUNTER — Ambulatory Visit (HOSPITAL_COMMUNITY)
Admission: EM | Admit: 2023-07-27 | Discharge: 2023-07-27 | Disposition: A | Discharge status: Home / Self Care | Attending: Emergency Medicine | Admitting: Emergency Medicine

## 2023-07-27 ENCOUNTER — Encounter (HOSPITAL_COMMUNITY): Payer: Self-pay

## 2023-07-27 DIAGNOSIS — M79671 Pain in right foot: Secondary | ICD-10-CM | POA: Diagnosis not present

## 2023-07-27 DIAGNOSIS — M722 Plantar fascial fibromatosis: Secondary | ICD-10-CM | POA: Diagnosis not present

## 2023-07-27 MED ORDER — ONDANSETRON 4 MG PO TBDP: 4.0000 mg | ORAL_TABLET | Freq: Once | ORAL | Status: DC

## 2023-07-27 MED ORDER — PREDNISONE 20 MG PO TABS: 40.0000 mg | ORAL_TABLET | Freq: Every day | ORAL | 0 refills | Status: AC

## 2023-07-27 NOTE — ED Provider Notes (Signed)
 [facility]    CSN: [id number] Arrival date & time: 07/27/23  1854      History   Chief Complaint Chief Complaint  Patient presents with   Foot Pain    HPI Suzanne Bush is a 38 y.o. female.  Several day history of right foot pain and swelling Denies injury, trauma, fall Having pain with standing, especially first thing in the morning.  Rating 8/10 She reports history of plantar fasciitis and heel spurs. Has never seen podiatry or ortho  Has used naproxen  and Tylenol  without relief   On chart review, had right foot xray in 2019 revealing tiny calcaneal spur  Past Medical History:  Diagnosis Date   ADHD    Bipolar 1 disorder (HCC)    Depression    Hx of trichomoniasis    Hypertension     Patient Active Problem List   Diagnosis Date Noted   Polysubstance abuse (HCC) 12/28/2017   Encounter for supervision of normal pregnancy, unspecified, unspecified trimester 12/06/2017   Adjustment disorder with disturbance of emotion 02/09/2017   Status post cesarean delivery 09/21/2015   Preeclampsia 09/20/2015   Gestational hypertension w/o significant proteinuria in 3rd trimester 09/13/2015   Supervision of normal pregnancy in third trimester 08/26/2015   Obesity affecting pregnancy in third trimester 08/26/2015   BMI 40.0-44.9, adult (HCC) 08/26/2015   History of cesarean delivery 08/26/2015    Past Surgical History:  Procedure Laterality Date   CESAREAN SECTION  2011   CESAREAN SECTION N/A 09/20/2015   Procedure: CESAREAN SECTION;  Surgeon: Lang JINNY Peel, DO;  Location: [facility];  Service: Obstetrics;  Laterality: N/A;   CHOLECYSTECTOMY  after baby was born    OB History     Gravida  6   Para  2   Term  2   Preterm  0   AB  3   Living  2      SAB  3   IAB  0   Ectopic  0   Multiple  0   Live Births  2        Obstetric Comments  05/2009: pLTCS for NRFHT. 7lbs 2oz. 1 layer chromic closure          Home  Medications    Prior to Admission medications   Medication Sig Start Date End Date Taking? Authorizing Provider  predniSONE  (DELTASONE ) 20 MG tablet Take 2 tablets (40 mg total) by mouth daily with breakfast for 5 days. 07/28/23 08/02/23 Yes Shinita Mac, [place], PA-C  ferrous sulfate  325 (65 FE) MG tablet Take 1 tablet (325 mg total) by mouth 2 (two) times daily with a meal. Patient not taking: Reported on 01/08/2019 03/17/18 03/12/19  Rudy Carlin LABOR, MD    Family History Family History  Problem Relation Age of Onset   Hypertension Mother     Social History Social History   Tobacco Use   Smoking status: Every Day    Current packs/day: 0.50    Types: Cigarettes   Smokeless tobacco: Never  Vaping Use   Vaping status: Former  Substance Use Topics   Alcohol use: Yes    Comment: socially   Drug use: Not Currently    Frequency: 2.0 times per week    Types: Marijuana    Comment: Last smoked 12/24/2017     Allergies   Patient has no known allergies.   Review of Systems Review of Systems As per HPI  Physical Exam Triage Vital Signs ED Triage Vitals  Encounter Vitals Group     BP 07/27/23 1917 (!) 120/90     Girls Systolic BP Percentile --      Girls Diastolic BP Percentile --      Boys Systolic BP Percentile --      Boys Diastolic BP Percentile --      Pulse Rate 07/27/23 1917 90     Resp 07/27/23 1917 18     Temp 07/27/23 1917 98.7 F (37.1 C)     Temp Source 07/27/23 1917 Oral     SpO2 07/27/23 1917 93 %     Weight 07/27/23 1917 215 lb (97.5 kg)     Height 07/27/23 1917 5' 5 (1.651 m)     Head Circumference --      Peak Flow --      Pain Score 07/27/23 1916 8     Pain Loc --      Pain Education --      Exclude from Growth Chart --    No data found.  Updated Vital Signs BP (!) 120/90 (BP Location: Left Arm)   Pulse 90   Temp 98.7 F (37.1 C) (Oral)   Resp 18   Ht 5' 5 (1.651 m)   Wt 215 lb (97.5 kg)   LMP 07/19/2023 (Approximate)   SpO2 93%   BMI  35.78 kg/m    Physical Exam Vitals and nursing note reviewed.  Constitutional:      General: She is not in acute distress. HENT:     Mouth/Throat:     Pharynx: Oropharynx is clear.   Cardiovascular:     Rate and Rhythm: Normal rate and regular rhythm.     Pulses: Normal pulses.  Pulmonary:     Effort: Pulmonary effort is normal.   Musculoskeletal:     Cervical back: Normal range of motion.  Feet:     Comments: No obvious swelling or deformity of the foot. Non tender palpation ankles, mid foot, toes, heel or arch. Pain elicited with plantar flexion.   Skin:    General: Skin is warm and dry.     Capillary Refill: Capillary refill takes less than 2 seconds.   Neurological:     Mental Status: She is alert and oriented to person, place, and time.     UC Treatments / Results  Labs (all labs ordered are listed, but only abnormal results are displayed) Labs Reviewed - No data to display  EKG  Radiology DG Foot Complete Right Result Date: 07/27/2023 CLINICAL DATA:  pain and swelling EXAM: RIGHT FOOT COMPLETE - 3+ VIEW COMPARISON:  None Available. FINDINGS: No acute fracture or dislocation. There is no evidence of arthropathy or other focal bone abnormality. Mild soft tissue swelling throughout the foot. No radiopaque foreign body. IMPRESSION: Mild soft tissue swelling about the foot. No acute fracture or dislocation. Electronically Signed   By: Rogelia Myers M.D.   On: 07/27/2023 19:50    Procedures Procedures (including critical care time)  Medications Ordered in UC Medications - No data to display  Initial Impression / Assessment and Plan / UC Course  I have reviewed the triage vital signs and the nursing notes.  Pertinent labs & imaging results that were available during my care of the patient were reviewed by me and considered in my medical decision making (see chart for details).  Wearing flat slides in clinic. Reports she does not usually wear these. I have  recommended against wearing them as they may worsen her symptoms given  lack of support.  Right foot xray today is unremarkable  Discontinue NSAIDs as she has no relief with these, trial of prednisone  burst. Discussed elevating, icing, supportive shoes with inserts, and following up with podiatry as soon as able.   Final Clinical Impressions(s) / UC Diagnoses   Final diagnoses:  Right foot pain  Plantar fasciitis, right     Discharge Instructions      Starting tomorrow morning, take the prednisone  as directed.  2 tablets daily for 5 days in a row.  While you are taking the prednisone , please do not use any other NSAIDs such as ibuprofen , Advil , Motrin , naproxen , Aleve , etc.  Please wear supportive shoes and purchase shoe inserts with arch support if able.  Call the podiatrist first thing Monday to make an appointment for follow-up     ED Prescriptions     Medication Sig Dispense Auth. Provider   predniSONE  (DELTASONE ) 20 MG tablet Take 2 tablets (40 mg total) by mouth daily with breakfast for 5 days. 10 tablet Kiyoko Mcguirt, [place], PA-C      PDMP not reviewed this encounter.   Teoman Giraud, [place] RIGGERS 07/27/23 2028

## 2023-07-27 NOTE — Discharge Instructions (Addendum)
 Starting tomorrow morning, take the prednisone  as directed.  2 tablets daily for 5 days in a row.  While you are taking the prednisone , please do not use any other NSAIDs such as ibuprofen , Advil , Motrin , naproxen , Aleve , etc.  Please wear supportive shoes and purchase shoe inserts with arch support if able.  Call the podiatrist first thing Monday to make an appointment for follow-up

## 2023-07-27 NOTE — ED Triage Notes (Signed)
 Patient presenting with right foot pain and swelling onset 2 days ago. Patient has history of plantar fasciitis and heel spurs. Now having pain with standing and throbbing when elevated.  Prescriptions or OTC medications tried: Yes- Naproxen , Tylenol     with no relief

## 2023-08-10 ENCOUNTER — Ambulatory Visit: Payer: MEDICAID | Admitting: Podiatry

## 2023-08-20 ENCOUNTER — Ambulatory Visit: Payer: MEDICAID | Admitting: Podiatry

## 2023-09-23 IMAGING — DX DG CHEST 2V
2 series · 2 of 2 positions shown · non-contrast
Comparison: None.

CLINICAL DATA: Chest pain.

EXAM:
CHEST - 2 VIEW

[w chest pa]
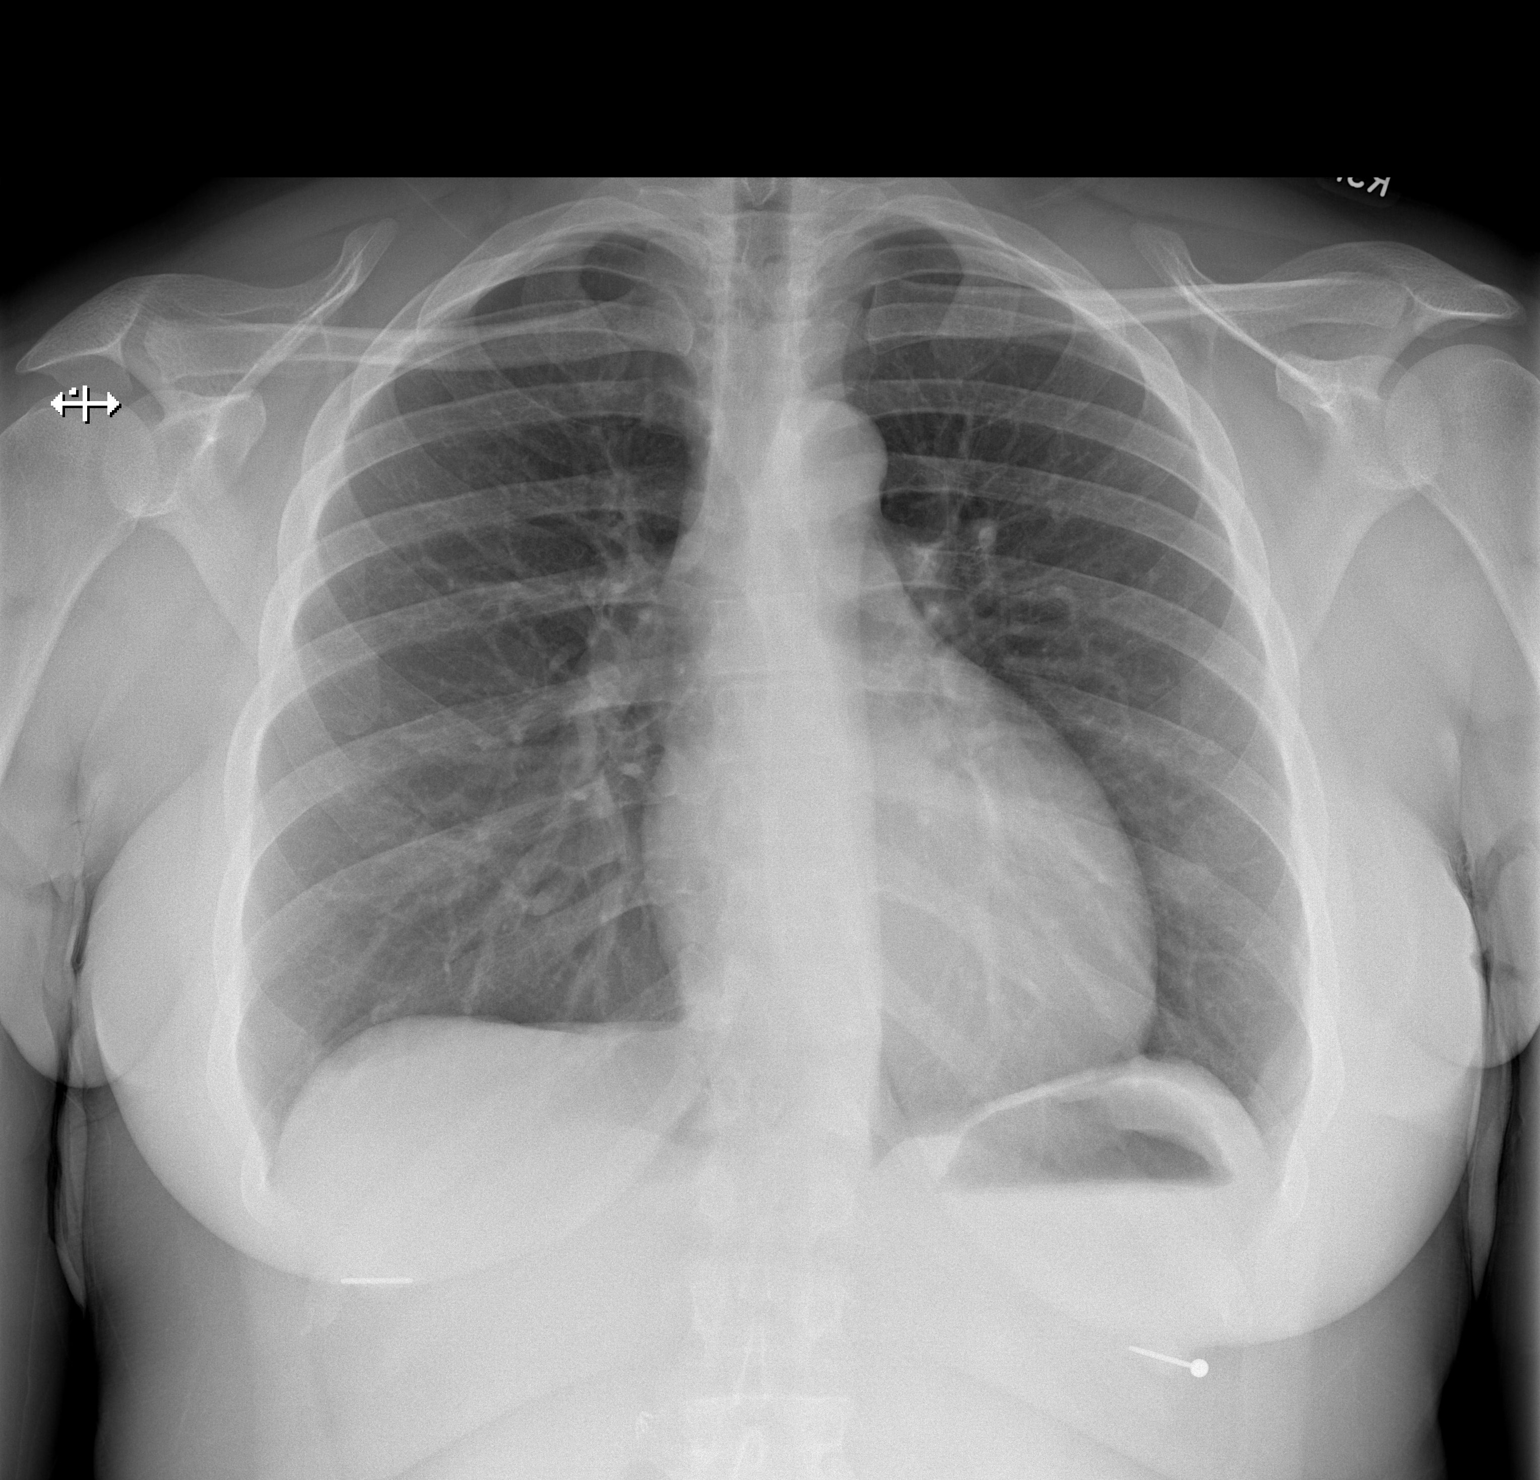

[w chest lat]
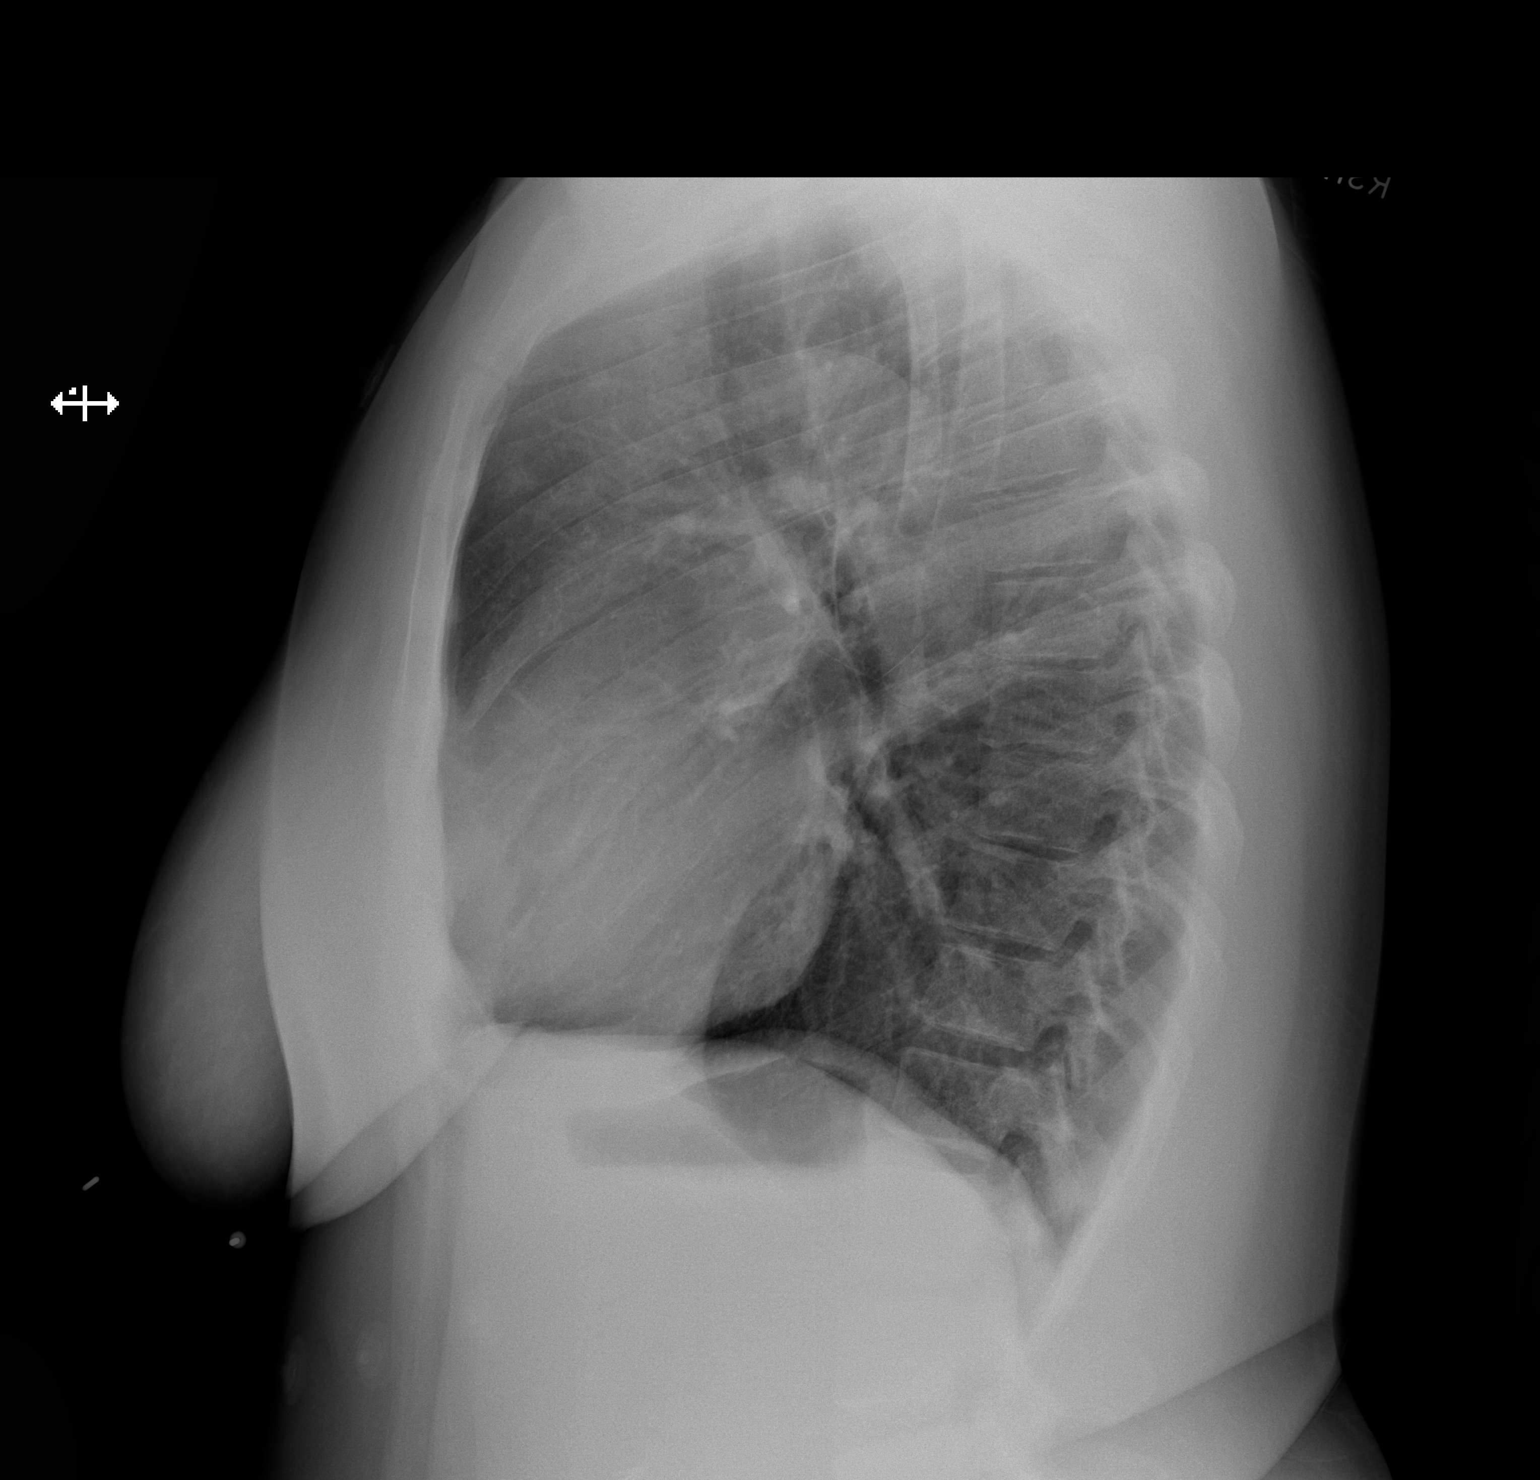

[2 of 2 positions shown; findings below may reference images not displayed]

FINDINGS: The heart size and mediastinal contours are within normal limits.
Both lungs are clear. The visualized skeletal structures are
unremarkable.
IMPRESSION: No active cardiopulmonary disease.

## 2023-12-09 ENCOUNTER — Encounter (HOSPITAL_COMMUNITY): Payer: Self-pay

## 2023-12-09 ENCOUNTER — Ambulatory Visit (HOSPITAL_COMMUNITY)
Admission: EM | Admit: 2023-12-09 | Discharge: 2023-12-09 | Disposition: A | Discharge status: Home / Self Care | Payer: MEDICAID | Attending: Internal Medicine | Admitting: Internal Medicine

## 2023-12-09 ENCOUNTER — Ambulatory Visit (INDEPENDENT_AMBULATORY_CARE_PROVIDER_SITE_OTHER): Payer: MEDICAID

## 2023-12-09 DIAGNOSIS — M79672 Pain in left foot: Secondary | ICD-10-CM

## 2023-12-09 DIAGNOSIS — M7989 Other specified soft tissue disorders: Secondary | ICD-10-CM

## 2023-12-09 MED ORDER — KETOROLAC TROMETHAMINE 30 MG/ML IJ SOLN
INTRAMUSCULAR | Status: AC
  Filled 2023-12-09: qty 1

## 2023-12-09 MED ORDER — DEXAMETHASONE SOD PHOSPHATE PF 10 MG/ML IJ SOLN
10.0000 mg | Freq: Once | INTRAMUSCULAR | Status: AC
  Administered 2023-12-09: 10 mg via INTRAMUSCULAR

## 2023-12-09 MED ORDER — KETOROLAC TROMETHAMINE 30 MG/ML IJ SOLN
30.0000 mg | Freq: Once | INTRAMUSCULAR | Status: AC
  Administered 2023-12-09: 30 mg via INTRAMUSCULAR

## 2023-12-09 NOTE — ED Provider Notes (Signed)
 [facility]    CSN: [id number] Arrival date & time: 12/09/23  1433      History   Chief Complaint Chief Complaint  Patient presents with   Foot Pain    HPI Suzanne Bush is a 38 y.o. female.   38 year old female presents urgent care with complaints of left foot pain and swelling.  She denies any injury to the area.  This started about 2 days ago.  She has had problems with both feet in the past.  She reports that the pain is mostly in her forefoot on the top and bottom.  She has not had any new activities or increase in activities.  She did get a new pair of work shoes recently.  She denies any fevers or chills.   Foot Pain Pertinent negatives include no chest pain, no abdominal pain and no shortness of breath.    Past Medical History:  Diagnosis Date   ADHD    Bipolar 1 disorder (HCC)    Depression    Hx of trichomoniasis    Hypertension     Patient Active Problem List   Diagnosis Date Noted   Polysubstance abuse (HCC) 12/28/2017   Encounter for supervision of normal pregnancy, unspecified, unspecified trimester 12/06/2017   Adjustment disorder with disturbance of emotion 02/09/2017   Status post cesarean delivery 09/21/2015   Preeclampsia 09/20/2015   Gestational hypertension w/o significant proteinuria in 3rd trimester 09/13/2015   Supervision of normal pregnancy in third trimester 08/26/2015   Obesity affecting pregnancy in third trimester 08/26/2015   BMI 40.0-44.9, adult (HCC) 08/26/2015   History of cesarean delivery 08/26/2015    Past Surgical History:  Procedure Laterality Date   CESAREAN SECTION  2011   CESAREAN SECTION N/A 09/20/2015   Procedure: CESAREAN SECTION;  Surgeon: Lang JINNY Peel, DO;  Location: [facility] BIRTHING SUIT[facility];  Service: Obstetrics;  Laterality: N/A;   CHOLECYSTECTOMY  after baby was born    OB History     Gravida  6   Para  2   Term  2   Preterm  0   AB  3   Living  2      SAB  3   IAB  0   Ectopic   0   Multiple  0   Live Births  2        Obstetric Comments  05/2009: pLTCS for NRFHT. 7lbs 2oz. 1 layer chromic closure          Home Medications    Prior to Admission medications   Medication Sig Start Date End Date Taking? Authorizing Provider  ferrous sulfate  325 (65 FE) MG tablet Take 1 tablet (325 mg total) by mouth 2 (two) times daily with a meal. Patient not taking: Reported on 01/08/2019 03/17/18 03/12/19  Rudy Carlin LABOR, MD    Family History Family History  Problem Relation Age of Onset   Hypertension Mother     Social History Social History   Tobacco Use   Smoking status: Every Day    Current packs/day: 0.50    Types: Cigarettes   Smokeless tobacco: Never  Vaping Use   Vaping status: Former  Substance Use Topics   Alcohol use: Yes    Comment: socially   Drug use: Yes    Frequency: 2.0 times per week    Types: Marijuana    Comment: Last smoked 12/24/2017     Allergies   Patient has no known allergies.   Review of Systems Review  of Systems  Constitutional:  Negative for chills and fever.  HENT:  Negative for ear pain and sore throat.   Eyes:  Negative for pain and visual disturbance.  Respiratory:  Negative for cough and shortness of breath.   Cardiovascular:  Negative for chest pain and palpitations.  Gastrointestinal:  Negative for abdominal pain and vomiting.  Genitourinary:  Negative for dysuria and hematuria.  Musculoskeletal:  Negative for arthralgias and back pain.       Left foot pain and swelling   Skin:  Negative for color change and rash.  Neurological:  Negative for seizures and syncope.  All other systems reviewed and are negative.    Physical Exam Triage Vital Signs ED Triage Vitals  Encounter Vitals Group     BP 12/09/23 1501 (!) 133/91     Girls Systolic BP Percentile --      Girls Diastolic BP Percentile --      Boys Systolic BP Percentile --      Boys Diastolic BP Percentile --      Pulse Rate 12/09/23 1501 83      Resp 12/09/23 1501 19     Temp 12/09/23 1501 98.7 F (37.1 C)     Temp Source 12/09/23 1501 Oral     SpO2 12/09/23 1501 97 %     Weight 12/09/23 1500 214 lb (97.1 kg)     Height 12/09/23 1500 5' 5 (1.651 m)     Head Circumference --      Peak Flow --      Pain Score 12/09/23 1500 8     Pain Loc --      Pain Education --      Exclude from Growth Chart --    No data found.  Updated Vital Signs BP (!) 133/91 (BP Location: Left Arm)   Pulse 83   Temp 98.7 F (37.1 C) (Oral)   Resp 19   Ht 5' 5 (1.651 m)   Wt 214 lb (97.1 kg)   LMP 12/08/2023   SpO2 97%   BMI 35.61 kg/m   Visual Acuity Right Eye Distance:   Left Eye Distance:   Bilateral Distance:    Right Eye Near:   Left Eye Near:    Bilateral Near:     Physical Exam Vitals and nursing note reviewed.  Constitutional:      General: She is not in acute distress.    Appearance: She is well-developed.  HENT:     Head: Normocephalic and atraumatic.  Eyes:     Conjunctiva/sclera: Conjunctivae normal.  Cardiovascular:     Rate and Rhythm: Normal rate and regular rhythm.     Pulses:          Dorsalis pedis pulses are 2+ on the left side.       Posterior tibial pulses are 2+ on the left side.     Heart sounds: No murmur heard. Pulmonary:     Effort: Pulmonary effort is normal. No respiratory distress.     Breath sounds: Normal breath sounds.  Abdominal:     Palpations: Abdomen is soft.     Tenderness: There is no abdominal tenderness.  Musculoskeletal:        General: No swelling.     Cervical back: Neck supple.  Feet:     Left foot:     Skin integrity: No ulcer, skin breakdown, erythema or dry skin.     Comments: Swelling around the left toes extending into the forefoot Skin:  General: Skin is warm and dry.     Capillary Refill: Capillary refill takes less than 2 seconds.  Neurological:     Mental Status: She is alert.  Psychiatric:        Mood and Affect: Mood normal.      UC Treatments /  Results  Labs (all labs ordered are listed, but only abnormal results are displayed) Labs Reviewed - No data to display  EKG   Radiology DG Foot Complete Left Result Date: 12/09/2023 CLINICAL DATA:  Left foot pain and swelling distally. EXAM: LEFT FOOT - COMPLETE 3+ VIEW COMPARISON:  02/05/2016. FINDINGS: There is no evidence of acute fracture or dislocation. No bony erosions or periosteal elevation are seen. Joint space is maintained. There is moderate calcaneal spurring. Soft tissue swelling is present over the forefoot. IMPRESSION: No acute osseous abnormality. Electronically Signed   By: Leita Birmingham M.D.   On: 12/09/2023 16:00    Procedures Procedures (including critical care time)  Medications Ordered in UC Medications  ketorolac  (TORADOL ) 30 MG/ML injection 30 mg (has no administration in time range)  dexamethasone  (DECADRON ) injection 10 mg (has no administration in time range)    Initial Impression / Assessment and Plan / UC Course  I have reviewed the triage vital signs and the nursing notes.  Pertinent labs & imaging results that were available during my care of the patient were reviewed by me and considered in my medical decision making (see chart for details).     Left foot pain - Plan: DG Foot Complete Left, DG Foot Complete Left  Swelling of left foot   X-ray of the left foot done today.  Final evaluation by the radiologist does not show any acute changes.  Symptoms and physical exam findings are most consistent with inflammation in the forefoot, this could be secondary to plantar fasciitis or possibly gout.  This can be treated with steroids to help improve inflammation, pain and swelling.  We have treated with the following today: Decadron  injection given today. This is a steroid to help with inflammation and pain. Toradol  injection given today. This is a medication to help with pain. This is not a narcotic.  Start 12/10/23 Prednisone  40 mg (2 tablets) once daily  for 4 days. Take this in the morning.  This is a steroid to help with inflammation and pain.  Do not take ibuprofen  while you are taking this medication May use Tylenol  if needed for pain Recommend elevating the foot to help with swelling throughout the day Return to urgent care or PCP if symptoms worsen or fail to resolve.    Final Clinical Impressions(s) / UC Diagnoses   Final diagnoses:  Left foot pain  Swelling of left foot     Discharge Instructions      X-ray of the left foot done today.  Final evaluation by the radiologist does not show any acute changes.  Symptoms and physical exam findings are most consistent with inflammation in the forefoot, this could be secondary to plantar fasciitis or possibly gout.  This can be treated with steroids to help improve inflammation, pain and swelling.  We have treated with the following today: Decadron  injection given today. This is a steroid to help with inflammation and pain. Toradol  injection given today. This is a medication to help with pain. This is not a narcotic.  Start 12/10/23 Prednisone  40 mg (2 tablets) once daily for 4 days. Take this in the morning.  This is a steroid to help  with inflammation and pain.  Do not take ibuprofen  while you are taking this medication May use Tylenol  if needed for pain Recommend elevating the foot to help with swelling throughout the day Return to urgent care or PCP if symptoms worsen or fail to resolve.       ED Prescriptions   None    PDMP not reviewed this encounter.   Teresa Almarie LABOR, [place] 12/09/23 1617

## 2023-12-09 NOTE — Discharge Instructions (Signed)
 X-ray of the left foot done today.  Final evaluation by the radiologist does not show any acute changes.  Symptoms and physical exam findings are most consistent with inflammation in the forefoot, this could be secondary to plantar fasciitis or possibly gout.  This can be treated with steroids to help improve inflammation, pain and swelling.  We have treated with the following today: Decadron  injection given today. This is a steroid to help with inflammation and pain. Toradol  injection given today. This is a medication to help with pain. This is not a narcotic.  Start 12/10/23 Prednisone  40 mg (2 tablets) once daily for 4 days. Take this in the morning.  This is a steroid to help with inflammation and pain.  Do not take ibuprofen  while you are taking this medication May use Tylenol  if needed for pain Recommend elevating the foot to help with swelling throughout the day Return to urgent care or PCP if symptoms worsen or fail to resolve.

## 2023-12-09 NOTE — ED Triage Notes (Signed)
 Pt states that her left foot is swollen and painful. X2 days

## 2023-12-10 ENCOUNTER — Telehealth (HOSPITAL_COMMUNITY): Payer: Self-pay

## 2023-12-10 MED ORDER — PREDNISONE 20 MG PO TABS: 40.0000 mg | ORAL_TABLET | Freq: Every day | ORAL | 0 refills | Status: AC

## 2023-12-10 NOTE — Telephone Encounter (Signed)
 Per patient access, Pt called about med order to be sent to [facility] on [address].  No meds sent at visit. Per provider note, Start 12/10/23 Prednisone  40 mg (2 tablets) once daily for 4 days.   Reviewed with MYRTIS Linden, MD, who advised to send as stated in provider note.  Rx sent to pharmacy on file.
